# Patient Record
Sex: Female | Born: 1935
Health system: Southern US, Community
[De-identification: ages and names within clinical notes are randomized; demographics above are authoritative.]

## PROBLEM LIST (undated history)

## (undated) ENCOUNTER — Emergency Department (HOSPITAL_COMMUNITY): Payer: Self-pay | Source: Home / Self Care

## (undated) DIAGNOSIS — I10 Essential (primary) hypertension: Secondary | ICD-10-CM

## (undated) DIAGNOSIS — I639 Cerebral infarction, unspecified: Secondary | ICD-10-CM

## (undated) DIAGNOSIS — E785 Hyperlipidemia, unspecified: Secondary | ICD-10-CM

## (undated) DIAGNOSIS — I4891 Unspecified atrial fibrillation: Secondary | ICD-10-CM

## (undated) DIAGNOSIS — E119 Type 2 diabetes mellitus without complications: Secondary | ICD-10-CM

## (undated) DIAGNOSIS — G459 Transient cerebral ischemic attack, unspecified: Secondary | ICD-10-CM

## (undated) HISTORY — DX: Cerebral infarction, unspecified: I63.9

## (undated) HISTORY — PX: ABDOMINAL HYSTERECTOMY: SHX81

## (undated) HISTORY — PX: TUBAL LIGATION: SHX77

---

## 2015-02-15 DIAGNOSIS — R040 Epistaxis: Secondary | ICD-10-CM | POA: Diagnosis not present

## 2015-03-29 DIAGNOSIS — I10 Essential (primary) hypertension: Secondary | ICD-10-CM | POA: Diagnosis not present

## 2015-03-29 DIAGNOSIS — E785 Hyperlipidemia, unspecified: Secondary | ICD-10-CM | POA: Diagnosis not present

## 2015-03-29 DIAGNOSIS — E119 Type 2 diabetes mellitus without complications: Secondary | ICD-10-CM | POA: Diagnosis not present

## 2015-10-28 DIAGNOSIS — E119 Type 2 diabetes mellitus without complications: Secondary | ICD-10-CM | POA: Diagnosis not present

## 2015-10-28 DIAGNOSIS — Z7689 Persons encountering health services in other specified circumstances: Secondary | ICD-10-CM | POA: Diagnosis not present

## 2015-10-28 DIAGNOSIS — E785 Hyperlipidemia, unspecified: Secondary | ICD-10-CM | POA: Diagnosis not present

## 2015-10-28 DIAGNOSIS — I10 Essential (primary) hypertension: Secondary | ICD-10-CM | POA: Diagnosis not present

## 2016-02-10 DIAGNOSIS — D485 Neoplasm of uncertain behavior of skin: Secondary | ICD-10-CM | POA: Diagnosis not present

## 2016-02-10 DIAGNOSIS — L570 Actinic keratosis: Secondary | ICD-10-CM | POA: Diagnosis not present

## 2016-02-11 DIAGNOSIS — D0439 Carcinoma in situ of skin of other parts of face: Secondary | ICD-10-CM | POA: Diagnosis not present

## 2016-02-11 DIAGNOSIS — L570 Actinic keratosis: Secondary | ICD-10-CM | POA: Diagnosis not present

## 2016-02-11 DIAGNOSIS — C44629 Squamous cell carcinoma of skin of left upper limb, including shoulder: Secondary | ICD-10-CM | POA: Diagnosis not present

## 2016-02-21 DIAGNOSIS — C44629 Squamous cell carcinoma of skin of left upper limb, including shoulder: Secondary | ICD-10-CM | POA: Diagnosis not present

## 2016-02-28 DIAGNOSIS — C4492 Squamous cell carcinoma of skin, unspecified: Secondary | ICD-10-CM | POA: Diagnosis not present

## 2016-02-28 DIAGNOSIS — L570 Actinic keratosis: Secondary | ICD-10-CM | POA: Diagnosis not present

## 2016-02-28 DIAGNOSIS — D485 Neoplasm of uncertain behavior of skin: Secondary | ICD-10-CM | POA: Diagnosis not present

## 2016-02-29 DIAGNOSIS — L570 Actinic keratosis: Secondary | ICD-10-CM | POA: Diagnosis not present

## 2016-07-19 ENCOUNTER — Ambulatory Visit (INDEPENDENT_AMBULATORY_CARE_PROVIDER_SITE_OTHER): Payer: Medicare Other

## 2016-07-19 ENCOUNTER — Ambulatory Visit (INDEPENDENT_AMBULATORY_CARE_PROVIDER_SITE_OTHER): Payer: Medicare Other | Admitting: Orthopaedic Surgery

## 2016-07-19 ENCOUNTER — Encounter (INDEPENDENT_AMBULATORY_CARE_PROVIDER_SITE_OTHER): Payer: Self-pay | Admitting: Orthopaedic Surgery

## 2016-07-19 ENCOUNTER — Ambulatory Visit (INDEPENDENT_AMBULATORY_CARE_PROVIDER_SITE_OTHER): Payer: Self-pay

## 2016-07-19 VITALS — BP 161/74 | HR 63 | Ht 63.0 in | Wt 149.0 lb

## 2016-07-19 DIAGNOSIS — M170 Bilateral primary osteoarthritis of knee: Secondary | ICD-10-CM

## 2016-07-19 DIAGNOSIS — G8929 Other chronic pain: Secondary | ICD-10-CM

## 2016-07-19 DIAGNOSIS — M25562 Pain in left knee: Secondary | ICD-10-CM | POA: Diagnosis not present

## 2016-07-19 DIAGNOSIS — M25561 Pain in right knee: Secondary | ICD-10-CM

## 2016-07-19 NOTE — Progress Notes (Signed)
Office Visit Note   Patient: Christy Sutton           Date of Birth: August 26, 1926           MRN: [id number] Visit Date: 07/19/2016              Requested by: No referring provider defined for this encounter. PCP: Pcp Not In System   Assessment & Plan: Visit Diagnoses:  1. Chronic pain of left knee   2. Chronic pain of right knee   3. Primary osteoarthritis of both knees     Plan: Long discussion with patient and her daughter-in-law was present. Advised that the definitive treatment for both knees would be total knee replacements. Patient states that she absolutely does not want to have that done. Recommended conservative treatment today with Visco supplementation but again patient declined. X-rays reviewed with patient today. She'll follow-up prn.  let us no she wants to try the injections in the future..  Follow-Up Instructions: No Follow-up on file.   Orders:  Orders Placed This Encounter  Procedures  . XR Knee 1-2 Views Left  . XR Knee 1-2 Views Right   No orders of the defined types were placed in this encounter.     Procedures: No procedures performed   Clinical Data: No additional findings.   Subjective: Chief Complaint  Patient presents with  . Left Knee - Pain  . Right Knee - Edema, Pain    Christy Sutton is here today for bilateral knee pain, they giveaway on her at times, at times they are worse than others, has stiffness, limps on them at times. This has been going on for years.   Patient states that she's had ongoing bilateral knee pain several years but worse over the last year or so. No injury. She continues to remain very active in her garden, and doing other various yardwork. She denies any previous surgery or treatment for her knees. Has discomfort when she is ambulating, squatting and getting up from a seated position.  Patient has a history of diabetes but is not currently taking metformin that has been previously prescribed area  Review of Systems    Constitutional: Negative.   HENT: Positive for hearing loss.   Respiratory: Negative.   Gastrointestinal: Negative.   Genitourinary: Negative.   Musculoskeletal: Positive for joint swelling.  Neurological: Negative.   Psychiatric/Behavioral: Negative.      Objective: Vital Signs: BP (!) 161/74 (BP Location: Left Arm, Patient Position: Sitting)   Pulse 63   Physical Exam  Constitutional: She is oriented to person, place, and time. No distress.  HENT:  Head: Normocephalic and atraumatic.  Eyes: EOM are normal. Pupils are equal, round, and reactive to light.  Neck: Normal range of motion.  Pulmonary/Chest: No respiratory distress.  Abdominal: She exhibits no distension.  Neurological: She is alert and oriented to person, place, and time.  Skin: Skin is warm.  Psychiatric: She has a normal mood and affect.    Ortho Exam  Specialty Comments:  No specialty comments available.  Imaging: Xr Knee 1-2 Views Left  Result Date: 07/19/2016 X-ray left knee 2 view shows medial compartment bone-on-bone changes and moderate patellofemoral degenerative changes. Periarticular spurs.. No acute finding. Impression end-stage DJD left knee.  Xr Knee 1-2 Views Right  Result Date: 07/19/2016 X-ray right knee AP lateral view shows end-stage DJD with medial compartment bone-on-bone changes and periarticular spurs. Moderate patellofemoral DJD. Impression end-stage DJD right knee.    PMFS History: There are  no active problems to display for this patient.  No past medical history on file.  No family history on file.  No past surgical history on file. Social History   Occupational History  . Not on file.   Social History Main Topics  . Smoking status: Never Smoker  . Smokeless tobacco: Not on file  . Alcohol use Not on file  . Drug use: Unknown  . Sexual activity: Not on file

## 2016-11-16 DIAGNOSIS — I10 Essential (primary) hypertension: Secondary | ICD-10-CM | POA: Diagnosis not present

## 2016-11-16 DIAGNOSIS — E119 Type 2 diabetes mellitus without complications: Secondary | ICD-10-CM | POA: Diagnosis not present

## 2016-11-16 DIAGNOSIS — Z760 Encounter for issue of repeat prescription: Secondary | ICD-10-CM | POA: Diagnosis not present

## 2016-12-26 DIAGNOSIS — R2681 Unsteadiness on feet: Secondary | ICD-10-CM | POA: Diagnosis not present

## 2016-12-26 DIAGNOSIS — E038 Other specified hypothyroidism: Secondary | ICD-10-CM | POA: Diagnosis not present

## 2016-12-26 DIAGNOSIS — Z1389 Encounter for screening for other disorder: Secondary | ICD-10-CM | POA: Diagnosis not present

## 2016-12-26 DIAGNOSIS — E784 Other hyperlipidemia: Secondary | ICD-10-CM | POA: Diagnosis not present

## 2016-12-26 DIAGNOSIS — E1151 Type 2 diabetes mellitus with diabetic peripheral angiopathy without gangrene: Secondary | ICD-10-CM | POA: Diagnosis not present

## 2016-12-26 DIAGNOSIS — H9193 Unspecified hearing loss, bilateral: Secondary | ICD-10-CM | POA: Diagnosis not present

## 2016-12-26 DIAGNOSIS — I10 Essential (primary) hypertension: Secondary | ICD-10-CM | POA: Diagnosis not present

## 2016-12-26 DIAGNOSIS — Z6825 Body mass index (BMI) 25.0-25.9, adult: Secondary | ICD-10-CM | POA: Diagnosis not present

## 2017-01-05 DIAGNOSIS — Z7982 Long term (current) use of aspirin: Secondary | ICD-10-CM | POA: Diagnosis not present

## 2017-01-05 DIAGNOSIS — Z880 Allergy status to penicillin: Secondary | ICD-10-CM | POA: Diagnosis not present

## 2017-01-05 DIAGNOSIS — R200 Anesthesia of skin: Secondary | ICD-10-CM | POA: Diagnosis not present

## 2017-01-05 DIAGNOSIS — R531 Weakness: Secondary | ICD-10-CM | POA: Diagnosis not present

## 2017-01-05 DIAGNOSIS — I10 Essential (primary) hypertension: Secondary | ICD-10-CM | POA: Diagnosis present

## 2017-01-05 DIAGNOSIS — Z7984 Long term (current) use of oral hypoglycemic drugs: Secondary | ICD-10-CM | POA: Diagnosis not present

## 2017-01-05 DIAGNOSIS — Z87891 Personal history of nicotine dependence: Secondary | ICD-10-CM | POA: Diagnosis not present

## 2017-01-05 DIAGNOSIS — I4891 Unspecified atrial fibrillation: Secondary | ICD-10-CM | POA: Diagnosis present

## 2017-01-05 DIAGNOSIS — E785 Hyperlipidemia, unspecified: Secondary | ICD-10-CM | POA: Diagnosis not present

## 2017-01-05 DIAGNOSIS — I517 Cardiomegaly: Secondary | ICD-10-CM | POA: Diagnosis not present

## 2017-01-05 DIAGNOSIS — Z79899 Other long term (current) drug therapy: Secondary | ICD-10-CM | POA: Diagnosis not present

## 2017-01-05 DIAGNOSIS — I481 Persistent atrial fibrillation: Secondary | ICD-10-CM | POA: Diagnosis not present

## 2017-01-05 DIAGNOSIS — I639 Cerebral infarction, unspecified: Secondary | ICD-10-CM | POA: Diagnosis not present

## 2017-01-05 DIAGNOSIS — E118 Type 2 diabetes mellitus with unspecified complications: Secondary | ICD-10-CM | POA: Diagnosis not present

## 2017-01-05 DIAGNOSIS — Z8673 Personal history of transient ischemic attack (TIA), and cerebral infarction without residual deficits: Secondary | ICD-10-CM | POA: Diagnosis not present

## 2017-01-05 DIAGNOSIS — R9082 White matter disease, unspecified: Secondary | ICD-10-CM | POA: Diagnosis not present

## 2017-01-05 DIAGNOSIS — E119 Type 2 diabetes mellitus without complications: Secondary | ICD-10-CM | POA: Diagnosis present

## 2017-01-05 DIAGNOSIS — E02 Subclinical iodine-deficiency hypothyroidism: Secondary | ICD-10-CM | POA: Diagnosis present

## 2017-01-05 DIAGNOSIS — G459 Transient cerebral ischemic attack, unspecified: Secondary | ICD-10-CM | POA: Diagnosis present

## 2017-01-05 DIAGNOSIS — I081 Rheumatic disorders of both mitral and tricuspid valves: Secondary | ICD-10-CM | POA: Diagnosis not present

## 2017-01-09 DIAGNOSIS — F17219 Nicotine dependence, cigarettes, with unspecified nicotine-induced disorders: Secondary | ICD-10-CM | POA: Diagnosis not present

## 2017-01-09 DIAGNOSIS — I4891 Unspecified atrial fibrillation: Secondary | ICD-10-CM | POA: Diagnosis not present

## 2017-01-09 DIAGNOSIS — Z7901 Long term (current) use of anticoagulants: Secondary | ICD-10-CM | POA: Diagnosis not present

## 2017-01-09 DIAGNOSIS — E669 Obesity, unspecified: Secondary | ICD-10-CM | POA: Diagnosis not present

## 2017-01-09 DIAGNOSIS — H9190 Unspecified hearing loss, unspecified ear: Secondary | ICD-10-CM | POA: Diagnosis not present

## 2017-01-09 DIAGNOSIS — I69354 Hemiplegia and hemiparesis following cerebral infarction affecting left non-dominant side: Secondary | ICD-10-CM | POA: Diagnosis not present

## 2017-01-09 DIAGNOSIS — Z7984 Long term (current) use of oral hypoglycemic drugs: Secondary | ICD-10-CM | POA: Diagnosis not present

## 2017-01-09 DIAGNOSIS — I10 Essential (primary) hypertension: Secondary | ICD-10-CM | POA: Diagnosis not present

## 2017-01-09 DIAGNOSIS — E119 Type 2 diabetes mellitus without complications: Secondary | ICD-10-CM | POA: Diagnosis not present

## 2017-01-09 DIAGNOSIS — Z6825 Body mass index (BMI) 25.0-25.9, adult: Secondary | ICD-10-CM | POA: Diagnosis not present

## 2017-01-10 DIAGNOSIS — I4891 Unspecified atrial fibrillation: Secondary | ICD-10-CM | POA: Diagnosis not present

## 2017-01-10 DIAGNOSIS — E119 Type 2 diabetes mellitus without complications: Secondary | ICD-10-CM | POA: Diagnosis not present

## 2017-01-10 DIAGNOSIS — H9190 Unspecified hearing loss, unspecified ear: Secondary | ICD-10-CM | POA: Diagnosis not present

## 2017-01-10 DIAGNOSIS — Z7984 Long term (current) use of oral hypoglycemic drugs: Secondary | ICD-10-CM | POA: Diagnosis not present

## 2017-01-10 DIAGNOSIS — I69354 Hemiplegia and hemiparesis following cerebral infarction affecting left non-dominant side: Secondary | ICD-10-CM | POA: Diagnosis not present

## 2017-01-10 DIAGNOSIS — I10 Essential (primary) hypertension: Secondary | ICD-10-CM | POA: Diagnosis not present

## 2017-01-11 DIAGNOSIS — I4891 Unspecified atrial fibrillation: Secondary | ICD-10-CM | POA: Diagnosis not present

## 2017-01-11 DIAGNOSIS — I10 Essential (primary) hypertension: Secondary | ICD-10-CM | POA: Diagnosis not present

## 2017-01-11 DIAGNOSIS — H9190 Unspecified hearing loss, unspecified ear: Secondary | ICD-10-CM | POA: Diagnosis not present

## 2017-01-11 DIAGNOSIS — Z7984 Long term (current) use of oral hypoglycemic drugs: Secondary | ICD-10-CM | POA: Diagnosis not present

## 2017-01-11 DIAGNOSIS — E119 Type 2 diabetes mellitus without complications: Secondary | ICD-10-CM | POA: Diagnosis not present

## 2017-01-11 DIAGNOSIS — I69354 Hemiplegia and hemiparesis following cerebral infarction affecting left non-dominant side: Secondary | ICD-10-CM | POA: Diagnosis not present

## 2017-01-15 DIAGNOSIS — H9190 Unspecified hearing loss, unspecified ear: Secondary | ICD-10-CM | POA: Diagnosis not present

## 2017-01-15 DIAGNOSIS — I69354 Hemiplegia and hemiparesis following cerebral infarction affecting left non-dominant side: Secondary | ICD-10-CM | POA: Diagnosis not present

## 2017-01-15 DIAGNOSIS — I4891 Unspecified atrial fibrillation: Secondary | ICD-10-CM | POA: Diagnosis not present

## 2017-01-15 DIAGNOSIS — I10 Essential (primary) hypertension: Secondary | ICD-10-CM | POA: Diagnosis not present

## 2017-01-15 DIAGNOSIS — E119 Type 2 diabetes mellitus without complications: Secondary | ICD-10-CM | POA: Diagnosis not present

## 2017-01-15 DIAGNOSIS — Z7984 Long term (current) use of oral hypoglycemic drugs: Secondary | ICD-10-CM | POA: Diagnosis not present

## 2017-01-16 DIAGNOSIS — H9190 Unspecified hearing loss, unspecified ear: Secondary | ICD-10-CM | POA: Diagnosis not present

## 2017-01-16 DIAGNOSIS — I4891 Unspecified atrial fibrillation: Secondary | ICD-10-CM | POA: Diagnosis not present

## 2017-01-16 DIAGNOSIS — Z7984 Long term (current) use of oral hypoglycemic drugs: Secondary | ICD-10-CM | POA: Diagnosis not present

## 2017-01-16 DIAGNOSIS — I69354 Hemiplegia and hemiparesis following cerebral infarction affecting left non-dominant side: Secondary | ICD-10-CM | POA: Diagnosis not present

## 2017-01-16 DIAGNOSIS — E119 Type 2 diabetes mellitus without complications: Secondary | ICD-10-CM | POA: Diagnosis not present

## 2017-01-16 DIAGNOSIS — I10 Essential (primary) hypertension: Secondary | ICD-10-CM | POA: Diagnosis not present

## 2017-01-17 ENCOUNTER — Emergency Department (HOSPITAL_COMMUNITY): Payer: Medicare Other

## 2017-01-17 ENCOUNTER — Inpatient Hospital Stay (HOSPITAL_COMMUNITY)
Admission: EM | Admit: 2017-01-17 | Discharge: 2017-01-19 | DRG: 066 | Disposition: A | Discharge status: Home Health | Payer: Medicare Other | Attending: Nephrology | Admitting: Nephrology

## 2017-01-17 ENCOUNTER — Encounter (HOSPITAL_COMMUNITY): Payer: Self-pay | Admitting: Emergency Medicine

## 2017-01-17 ENCOUNTER — Observation Stay (HOSPITAL_COMMUNITY): Payer: Medicare Other

## 2017-01-17 DIAGNOSIS — Z7982 Long term (current) use of aspirin: Secondary | ICD-10-CM

## 2017-01-17 DIAGNOSIS — Z90710 Acquired absence of both cervix and uterus: Secondary | ICD-10-CM | POA: Diagnosis not present

## 2017-01-17 DIAGNOSIS — Z8249 Family history of ischemic heart disease and other diseases of the circulatory system: Secondary | ICD-10-CM

## 2017-01-17 DIAGNOSIS — R278 Other lack of coordination: Secondary | ICD-10-CM | POA: Diagnosis not present

## 2017-01-17 DIAGNOSIS — E1151 Type 2 diabetes mellitus with diabetic peripheral angiopathy without gangrene: Secondary | ICD-10-CM | POA: Diagnosis not present

## 2017-01-17 DIAGNOSIS — Z794 Long term (current) use of insulin: Secondary | ICD-10-CM

## 2017-01-17 DIAGNOSIS — E785 Hyperlipidemia, unspecified: Secondary | ICD-10-CM | POA: Diagnosis not present

## 2017-01-17 DIAGNOSIS — R531 Weakness: Secondary | ICD-10-CM | POA: Diagnosis not present

## 2017-01-17 DIAGNOSIS — E118 Type 2 diabetes mellitus with unspecified complications: Secondary | ICD-10-CM | POA: Diagnosis not present

## 2017-01-17 DIAGNOSIS — I482 Chronic atrial fibrillation: Secondary | ICD-10-CM | POA: Diagnosis not present

## 2017-01-17 DIAGNOSIS — I480 Paroxysmal atrial fibrillation: Secondary | ICD-10-CM | POA: Diagnosis present

## 2017-01-17 DIAGNOSIS — Z66 Do not resuscitate: Secondary | ICD-10-CM | POA: Diagnosis not present

## 2017-01-17 DIAGNOSIS — Z7984 Long term (current) use of oral hypoglycemic drugs: Secondary | ICD-10-CM | POA: Diagnosis not present

## 2017-01-17 DIAGNOSIS — Z880 Allergy status to penicillin: Secondary | ICD-10-CM | POA: Diagnosis not present

## 2017-01-17 DIAGNOSIS — Z8673 Personal history of transient ischemic attack (TIA), and cerebral infarction without residual deficits: Secondary | ICD-10-CM

## 2017-01-17 DIAGNOSIS — I6789 Other cerebrovascular disease: Secondary | ICD-10-CM | POA: Diagnosis not present

## 2017-01-17 DIAGNOSIS — I63411 Cerebral infarction due to embolism of right middle cerebral artery: Secondary | ICD-10-CM

## 2017-01-17 DIAGNOSIS — I639 Cerebral infarction, unspecified: Secondary | ICD-10-CM | POA: Diagnosis not present

## 2017-01-17 DIAGNOSIS — H9193 Unspecified hearing loss, bilateral: Secondary | ICD-10-CM | POA: Diagnosis present

## 2017-01-17 DIAGNOSIS — G459 Transient cerebral ischemic attack, unspecified: Secondary | ICD-10-CM | POA: Diagnosis not present

## 2017-01-17 DIAGNOSIS — R29818 Other symptoms and signs involving the nervous system: Secondary | ICD-10-CM | POA: Diagnosis not present

## 2017-01-17 DIAGNOSIS — I10 Essential (primary) hypertension: Secondary | ICD-10-CM | POA: Diagnosis present

## 2017-01-17 HISTORY — DX: Unspecified atrial fibrillation: I48.91

## 2017-01-17 HISTORY — DX: Type 2 diabetes mellitus without complications: E11.9

## 2017-01-17 HISTORY — DX: Essential (primary) hypertension: I10

## 2017-01-17 HISTORY — DX: Hyperlipidemia, unspecified: E78.5

## 2017-01-17 HISTORY — DX: Transient cerebral ischemic attack, unspecified: G45.9

## 2017-01-17 LAB — COMPREHENSIVE METABOLIC PANEL
ALBUMIN: 3.9 g/dL (ref 3.5–5.0)
ALK PHOS: 38 U/L (ref 38–126)
ALT: 13 U/L — AB (ref 14–54)
AST: 25 U/L (ref 15–41)
Anion gap: 10 (ref 5–15)
BUN: 9 mg/dL (ref 6–20)
CHLORIDE: 107 mmol/L (ref 101–111)
CO2: 22 mmol/L (ref 22–32)
CREATININE: 0.78 mg/dL (ref 0.44–1.00)
Calcium: 9.1 mg/dL (ref 8.9–10.3)
GFR calc Af Amer: >60 mL/min (ref >60)
GFR calc non Af Amer: >60 mL/min (ref >60)
GLUCOSE: 126 mg/dL — AB (ref 65–99)
Potassium: 3.8 mmol/L (ref 3.5–5.1)
SODIUM: 139 mmol/L (ref 135–145)
Total Bilirubin: 1 mg/dL (ref 0.3–1.2)
Total Protein: 6.2 g/dL — ABNORMAL LOW (ref 6.5–8.1)

## 2017-01-17 LAB — I-STAT CHEM 8, ED
BUN: 10 mg/dL (ref 6–20)
CALCIUM ION: 1.11 mmol/L — AB (ref 1.15–1.40)
Chloride: 105 mmol/L (ref 101–111)
Creatinine, Ser: 0.7 mg/dL (ref 0.44–1.00)
GLUCOSE: 124 mg/dL — AB (ref 65–99)
HEMATOCRIT: 36 % (ref 36.0–46.0)
HEMOGLOBIN: 12.2 g/dL (ref 12.0–15.0)
Potassium: 3.9 mmol/L (ref 3.5–5.1)
SODIUM: 141 mmol/L (ref 135–145)
TCO2: 26 mmol/L (ref 0–100)

## 2017-01-17 LAB — DIFFERENTIAL
BASOS ABS: 0 10*3/uL (ref 0.0–0.1)
BASOS PCT: 0 %
Eosinophils Absolute: 0.1 10*3/uL (ref 0.0–0.7)
Eosinophils Relative: 2 %
LYMPHS PCT: 17 %
Lymphs Abs: 0.9 10*3/uL (ref 0.7–4.0)
Monocytes Absolute: 0.4 10*3/uL (ref 0.1–1.0)
Monocytes Relative: 8 %
NEUTROS ABS: 4 10*3/uL (ref 1.7–7.7)
NEUTROS PCT: 73 %

## 2017-01-17 LAB — CBC
HCT: 36.7 % (ref 36.0–46.0)
Hemoglobin: 12.5 g/dL (ref 12.0–15.0)
MCH: 30.6 pg (ref 26.0–34.0)
MCHC: 34.1 g/dL (ref 30.0–36.0)
MCV: 89.7 fL (ref 78.0–100.0)
PLATELETS: 126 10*3/uL — AB (ref 150–400)
RBC: 4.09 MIL/uL (ref 3.87–5.11)
RDW: 12.7 % (ref 11.5–15.5)
WBC: 5.4 10*3/uL (ref 4.0–10.5)

## 2017-01-17 LAB — I-STAT TROPONIN, ED: Troponin i, poc: 0.01 ng/mL (ref 0.00–0.08)

## 2017-01-17 LAB — GLUCOSE, CAPILLARY
Glucose-Capillary: 162 mg/dL — ABNORMAL HIGH (ref 65–99)
Glucose-Capillary: 95 mg/dL (ref 65–99)

## 2017-01-17 LAB — CBG MONITORING, ED: GLUCOSE-CAPILLARY: 98 mg/dL (ref 65–99)

## 2017-01-17 LAB — PROTIME-INR
INR: 3.25
PROTHROMBIN TIME: 33.9 s — AB (ref 11.4–15.2)

## 2017-01-17 LAB — APTT: APTT: 63 s — AB (ref 24–36)

## 2017-01-17 MED ORDER — INSULIN ASPART 100 UNIT/ML ~~LOC~~ SOLN: 0.0000 [IU] | Freq: Every day | SUBCUTANEOUS | Status: DC

## 2017-01-17 MED ORDER — MIDAZOLAM HCL 2 MG/2ML IJ SOLN: 1.0000 mg | Freq: Once | INTRAMUSCULAR | Status: DC

## 2017-01-17 MED ORDER — APIXABAN 5 MG PO TABS
5.0000 mg | ORAL_TABLET | Freq: Two times a day (BID) | ORAL | Status: DC
  Administered 2017-01-18 – 2017-01-19 (×3): 5 mg via ORAL
  Filled 2017-01-17 (×3): qty 1

## 2017-01-17 MED ORDER — ASPIRIN 81 MG PO CHEW: 81.0000 mg | CHEWABLE_TABLET | Freq: Every day | ORAL | Status: DC

## 2017-01-17 MED ORDER — APIXABAN 5 MG PO TABS
5.0000 mg | ORAL_TABLET | Freq: Two times a day (BID) | ORAL | Status: DC
Start: 2017-01-17 — End: 2017-01-17

## 2017-01-17 MED ORDER — IOPAMIDOL (ISOVUE-370) INJECTION 76%
INTRAVENOUS | Status: AC
  Administered 2017-01-17: 50 mL
  Filled 2017-01-17: qty 50

## 2017-01-17 MED ORDER — INSULIN ASPART 100 UNIT/ML ~~LOC~~ SOLN
0.0000 [IU] | Freq: Three times a day (TID) | SUBCUTANEOUS | Status: DC
  Administered 2017-01-17: 2 [IU] via SUBCUTANEOUS
  Administered 2017-01-18: 1 [IU] via SUBCUTANEOUS

## 2017-01-17 MED ORDER — HYDRALAZINE HCL 20 MG/ML IJ SOLN: 5.0000 mg | INTRAMUSCULAR | Status: DC | PRN

## 2017-01-17 MED ORDER — ATORVASTATIN CALCIUM 10 MG PO TABS
20.0000 mg | ORAL_TABLET | Freq: Every day | ORAL | Status: DC
  Administered 2017-01-18: 20 mg via ORAL
  Filled 2017-01-17 (×2): qty 2

## 2017-01-17 NOTE — Consult Note (Signed)
Requesting Physician: Dr. Jeneen Rinks in ED    Chief Complaint: transient L weakness   History obtained from:   Patient and Chart   And daughter   HPI:                                                                                                                                         Christy Sutton is an 83 y.o. female with PMH significant for afib on Eliquis ( last dose taken this AM confirmed by daughter), who was last seen well at 10 pm last night when she went to bed then woke up this morning at 4 am with L sided weakness and gait disturbance, pt went back to bed then was seen by her daughter at 730 am who noticed the left sided weakness and decided to call 911 and bring pt to ED, in ED pt is back to baseline, CT/CTA showed no acute changes ( there was small delay in getting [place] since the firt CT scanner was not working and we had to move pt to the 2nd scanner), pt was not given IVTPA since she was outside the window and took eliquis this morning , no intervention due to no LVO on CTA.  Date last known well: 01/16/2017 Time last known well: 10 pm  tPA Given: No   Modified Rankin: 0    Past Medical History:  Diagnosis Date  . TIA (transient ischemic attack)     No past surgical history on file.  No family history on file. Social History:  reports that she has never smoked. She does not have any smokeless tobacco history on file. Her alcohol and drug histories are not on file.  Allergies:  Allergies  Allergen Reactions  . Penicillins Hives    Medications:                                                                                                                           {medication reviewed/ by me  ROS:  History obtained from the patient  General ROS: negative for - chills, fatigue, fever, night sweats, weight gain or weight loss Psychological  ROS: negative for - behavioral disorder, hallucinations, memory difficulties, mood swings or suicidal ideation Ophthalmic ROS: negative for - blurry vision, double vision, eye pain or loss of vision ENT ROS: negative for - epistaxis, nasal discharge, oral lesions, sore throat, tinnitus or vertigo Allergy and Immunology ROS: negative for - hives or itchy/watery eyes Hematological and Lymphatic ROS: negative for - bleeding problems, bruising or swollen lymph nodes Endocrine ROS: negative for - galactorrhea, hair pattern changes, polydipsia/polyuria or temperature intolerance Respiratory ROS: negative for - cough, hemoptysis, shortness of breath or wheezing Cardiovascular ROS: negative for - chest pain, dyspnea on exertion, edema or irregular heartbeat Gastrointestinal ROS: negative for - abdominal pain, diarrhea, hematemesis, nausea/vomiting or stool incontinence Genito-Urinary ROS: negative for - dysuria, hematuria, incontinence or urinary frequency/urgency Musculoskeletal ROS: negative for - joint swelling or muscular weakness Neurological ROS: as noted in HPI Dermatological ROS: negative for rash and skin lesion changes  Neurologic Examination:    NIHSS 1 due to minor L ataxia                                                                                                  Blood pressure 119/89, pulse 62, temperature 98.1 F (36.7 C), temperature source Oral, resp. rate 17, SpO2 98 %.   Cardiovascular- irregular, S1S2  pulses palpable throughout   Lungs- CTA \\Abdomen  soft NT  Neurological Examination Mental Status: Alert, oriented, thought content appropriate.  Speech fluent without evidence of aphasia.  Able to follow 3 step commands without difficulty. Cranial Nerves: II: Discs flat bilaterally; Visual fields grossly normal,  III,IV, VI: ptosis not present, extra-ocular motions intact bilaterally, pupils equal, round, reactive to light and accommodation V,VII: smile symmetric, facial  light touch sensation normal bilaterally VIII: decrease hearing bilaterally ( chronic) IX,X: uvula rises symmetrically XI: bilateral shoulder shrug XII: midline tongue extension Motor: Right : Upper extremity   5/5    Left:     Upper extremity   5/5  Lower extremity   5/5     Lower extremity   5/5 Tone and bulk:normal tone throughout; no atrophy noted Sensory: Pinprick and light touch intact throughout, bilaterally Cerebellar: abnormal finger-to-nose on L, normal heel-to-shin test Gait: unable to test      Lab Results: Basic Metabolic Panel:  Recent Labs Lab 01/17/17 1058 01/17/17 1105  NA 139 141  K 3.8 3.9  CL 107 105  CO2 22  --   GLUCOSE 126* 124*  BUN 9 10  CREATININE 0.78 0.70  CALCIUM 9.1  --     Liver Function Tests:  Recent Labs Lab 01/17/17 1058  AST 25  ALT 13*  ALKPHOS 38  BILITOT 1.0  PROT 6.2*  ALBUMIN 3.9   No results for input(s): LIPASE, AMYLASE in the last 168 hours. No results for input(s): AMMONIA in the last 168 hours.  CBC:  Recent Labs Lab 01/17/17 1058 01/17/17 1105  WBC 5.4  --   NEUTROABS 4.0  --  HGB 12.5 12.2  HCT 36.7 36.0  MCV 89.7  --   PLT 126*  --     Cardiac Enzymes: No results for input(s): CKTOTAL, CKMB, CKMBINDEX, TROPONINI in the last 168 hours.  Lipid Panel: No results for input(s): CHOL, TRIG, HDL, CHOLHDL, VLDL, LDLCALC in the last 168 hours.  CBG:  Recent Labs Lab 01/17/17 1159  GLUCAP 98    Microbiology: No results found for this or any previous visit.  Coagulation Studies:  Recent Labs  01/17/17 1058  LABPROT 33.9*  INR 3.25    Imaging: Ct Angio Head W Or Wo Contrast  Result Date: 01/17/2017 CLINICAL DATA:  Acute onset of left-sided weakness beginning last night. EXAM: CT ANGIOGRAPHY HEAD AND NECK TECHNIQUE: Multidetector CT imaging of the head and neck was performed using the standard protocol during bolus administration of intravenous contrast. Multiplanar CT image reconstructions  and MIPs were obtained to evaluate the vascular anatomy. Carotid stenosis measurements (when applicable) are obtained utilizing NASCET criteria, using the distal internal carotid diameter as the denominator. CONTRAST:  50 mL Isovue 370 COMPARISON:  CT head without contrast from the same day. CTA head and neck 01/05/2017. FINDINGS: CTA NECK FINDINGS Aortic arch: A 3 vessel arch configuration is present. Atherosclerotic calcifications involving the origin of left common carotid artery and the left subclavian artery are stable. There is no significant stenosis. Right carotid system: The right common carotid artery is within normal limits. Slight atherosclerotic irregularity is present at the right carotid bifurcation without a significant stenosis. The cervical right internal carotid artery is mildly tortuous without a focal stenosis. Left carotid system: The left common carotid artery is within normal limits. Atherosclerotic changes are present at the left carotid bifurcation without a significant stenosis. The cervical left internal carotid artery is normal. Vertebral arteries: The vertebral arteries originate from the subclavian arteries bilaterally. The right vertebral artery is slightly dominant to the left. There is no focal stenosis or vascular injury to either vertebral artery in the neck. Skeleton: Mild spondylosis of the cervical spine is again seen. Other neck: The soft tissues the neck are unremarkable. Upper chest: Pleural-parenchymal scarring is again noted the lung apices. The lungs are otherwise clear. Previously noted edema has improved. Peripheral reticular changes may reflect early interstitial disease. Review of the MIP images confirms the above findings CTA HEAD FINDINGS Anterior circulation: Atherosclerotic calcifications are again seen in the cavernous internal carotid arteries bilaterally without a significant stenosis and sub. There is no aneurysm. The ICA termini are within normal limits  bilaterally. The A1 and M1 segments are normal. The anterior communicating artery is patent. The MCA bifurcations are within normal limits. ACA and MCA branch vessels are unremarkable. Posterior circulation: The right vertebral artery is the dominant vessel. PICA origins are visualized and normal. The basilar artery is within normal limits. Both posterior cerebral arteries originate from the basilar tip. The PCA branch vessels are within normal limits. Venous sinuses: The dural sinuses are patent. The transverse sinuses are codominant. The straight sinus and deep cerebral veins are intact. Cortical veins are unremarkable. Anatomic variants: None Delayed phase: The postcontrast images demonstrate no pathologic enhancement. Basal ganglia calcifications are stable. Review of the MIP images confirms the above findings IMPRESSION: 1. No evidence for emerging large vessel occlusion. 2. Atherosclerotic changes at the aortic arch, bilateral carotid bifurcations, and cavernous internal carotid arteries without significant focal stenosis. 3. Normal variant circle of Willis without significant proximal stenosis, aneurysm, or branch vessel occlusion. 4. Mild multilevel  spondylosis of the cervical spine. 5. Question early interstitial lung changes. These results were called by telephone at the time of interpretation on 01/17/2017 at 11:32 am to Dr. Arnaldo Natal , who verbally acknowledged these results. Electronically Signed   By: San Morelle M.D.   On: 01/17/2017 11:55   Ct Angio Neck W And/or Wo Contrast  Result Date: 01/17/2017 CLINICAL DATA:  Acute onset of left-sided weakness beginning last night. EXAM: CT ANGIOGRAPHY HEAD AND NECK TECHNIQUE: Multidetector CT imaging of the head and neck was performed using the standard protocol during bolus administration of intravenous contrast. Multiplanar CT image reconstructions and MIPs were obtained to evaluate the vascular anatomy. Carotid stenosis measurements (when  applicable) are obtained utilizing NASCET criteria, using the distal internal carotid diameter as the denominator. CONTRAST:  50 mL Isovue 370 COMPARISON:  CT head without contrast from the same day. CTA head and neck 01/05/2017. FINDINGS: CTA NECK FINDINGS Aortic arch: A 3 vessel arch configuration is present. Atherosclerotic calcifications involving the origin of left common carotid artery and the left subclavian artery are stable. There is no significant stenosis. Right carotid system: The right common carotid artery is within normal limits. Slight atherosclerotic irregularity is present at the right carotid bifurcation without a significant stenosis. The cervical right internal carotid artery is mildly tortuous without a focal stenosis. Left carotid system: The left common carotid artery is within normal limits. Atherosclerotic changes are present at the left carotid bifurcation without a significant stenosis. The cervical left internal carotid artery is normal. Vertebral arteries: The vertebral arteries originate from the subclavian arteries bilaterally. The right vertebral artery is slightly dominant to the left. There is no focal stenosis or vascular injury to either vertebral artery in the neck. Skeleton: Mild spondylosis of the cervical spine is again seen. Other neck: The soft tissues the neck are unremarkable. Upper chest: Pleural-parenchymal scarring is again noted the lung apices. The lungs are otherwise clear. Previously noted edema has improved. Peripheral reticular changes may reflect early interstitial disease. Review of the MIP images confirms the above findings CTA HEAD FINDINGS Anterior circulation: Atherosclerotic calcifications are again seen in the cavernous internal carotid arteries bilaterally without a significant stenosis and sub. There is no aneurysm. The ICA termini are within normal limits bilaterally. The A1 and M1 segments are normal. The anterior communicating artery is patent. The  MCA bifurcations are within normal limits. ACA and MCA branch vessels are unremarkable. Posterior circulation: The right vertebral artery is the dominant vessel. PICA origins are visualized and normal. The basilar artery is within normal limits. Both posterior cerebral arteries originate from the basilar tip. The PCA branch vessels are within normal limits. Venous sinuses: The dural sinuses are patent. The transverse sinuses are codominant. The straight sinus and deep cerebral veins are intact. Cortical veins are unremarkable. Anatomic variants: None Delayed phase: The postcontrast images demonstrate no pathologic enhancement. Basal ganglia calcifications are stable. Review of the MIP images confirms the above findings IMPRESSION: 1. No evidence for emerging large vessel occlusion. 2. Atherosclerotic changes at the aortic arch, bilateral carotid bifurcations, and cavernous internal carotid arteries without significant focal stenosis. 3. Normal variant circle of Willis without significant proximal stenosis, aneurysm, or branch vessel occlusion. 4. Mild multilevel spondylosis of the cervical spine. 5. Question early interstitial lung changes. These results were called by telephone at the time of interpretation on 01/17/2017 at 11:32 am to Dr. Arnaldo Natal , who verbally acknowledged these results. Electronically Signed   By: San Morelle  M.D.   On: 01/17/2017 11:55   Ct Head Code Stroke W/o Cm  Result Date: 01/17/2017 CLINICAL DATA:  Code stroke. Acute onset of left-sided deficits. Code stroke. Left-sided weakness beginning last night. EXAM: CT HEAD WITHOUT CONTRAST TECHNIQUE: Contiguous axial images were obtained from the base of the skull through the vertex without intravenous contrast. COMPARISON:  CT of the head 01/05/2017.  MRI brain 01/05/2017. FINDINGS: Brain: Bilateral basal ganglia calcifications are again noted, right greater than left. The basal ganglia are otherwise intact. No acute infarct,  hemorrhage, or mass lesion is present. Mild white matter changes are stable. The ventricles are of normal size. No significant extra-axial fluid collection is present. The brainstem and cerebellum are within normal limits. Vascular: Atherosclerotic calcifications are present in the cavernous internal carotid arteries bilaterally without a hyperdense vessel. Skull: The calvarium is intact. No focal lytic or blastic lesions are present. Sinuses/Orbits: The paranasal sinuses and mastoid air cells are clear. Bilateral lens replacements are present. The globes and orbits are within normal limits. ASPECTS [facility] Stroke Program Early CT Score) - Ganglionic level infarction (caudate, lentiform nuclei, internal capsule, insula, M1-M3 cortex): 7/7 - Supraganglionic infarction (M4-M6 cortex): 3/3 Total score (0-10 with 10 being normal): 10/10 IMPRESSION: 1. No acute intracranial abnormality. 2. Stable atrophy and mild white matter disease, likely within normal limits for age. 3. Atherosclerosis without a hyperdense vessel. 4. ASPECTS is 10/10 These results were called by telephone at the time of interpretation on 01/17/2017 at 11:28 am to Dr. Paschal Dopp , who verbally acknowledged these results. Electronically Signed   By: San Morelle M.D.   On: 01/17/2017 11:28           Assessment: 83 y.o. female with PMH of afib on Eliquis  and B/L hearing loss who presented with transient L weakness with a negative CT/CTA -NO IVTPA due to : outside the window, low NIHss, the use of Eliquis ( lasrt dose this AM) -No intervention due to no LVO on CTA -Continue Eliquis -MRI brain -Stroke work up including ECHO, A1C and lipid -start Lipitor  -PT/OT  -Plan discussed with daughter at bedside

## 2017-01-17 NOTE — ED Notes (Signed)
Pt CBG was 98, notified Brooke(RN)

## 2017-01-17 NOTE — ED Notes (Signed)
PA-C at bedside 

## 2017-01-17 NOTE — ED Notes (Signed)
Dr. Merrell at bedside. 

## 2017-01-17 NOTE — H&P (Signed)
History and Physical    Kiyonna Tortorelli MEQ:[id number] DOB: 10/04/1925 DOA: 01/17/2017  PCP: System, Pcp Not In Patient coming from: Home  Chief Complaint: L sided weakness  HPI: Hillary Struss is a 83 y.o. female with medical history significant of intervertebral relation, diabetes, hyperlipidemia, hypertension, TIAs. Patient presenting with recurrent episode of left-sided numbness and weakness. Patient reports long-standing history of intermittent left-sided weakness. Symptoms always resolve without intervention. Current episode however is constant and worse than ever before. Patient endorses going to bed on the day prior to admission in her normal state of health. Patient awoke somewhere between 3:57 in the morning and noted to have left-sided weakness. Patient states she was able to "shake it out. "She was able to go back to bed a short time later feeling completely normal. However one patient awoke between 06:00 and 07:00 she reported feeling completely normal. The short time later while sitting in a chair in her living room patient endorsed rapid onset left-sided numbness and weakness of her arm and leg. Family was called to evaluate patient for short time later and noted that she also had significant left facial droop and weakness. Patient denies any recent fevers, chest pain, shortness of breath, palpitations, diaphoresis, nausea, vomiting, neck stiffness, headache, LOC, fall, abdominal pain, dysuria, frequency.      ED Course: Objective findings outlined below.  Review of Systems: As per HPI otherwise all other systems reviewed and are negative  Ambulatory Status: No restrictions  Past Medical History:  Diagnosis Date  . Atrial fibrillation ([place])   . Diabetes mellitus without complication ([place])   . Hyperlipidemia   . Hypertension   . TIA (transient ischemic attack)     Past Surgical History:  Procedure Laterality Date  . ABDOMINAL HYSTERECTOMY    . TUBAL LIGATION      Social  History   Social History  . Marital status: Single    Spouse name: N/A  . Number of children: N/A  . Years of education: N/A   Occupational History  . Not on file.   Social History Main Topics  . Smoking status: Never Smoker  . Smokeless tobacco: Never Used  . Alcohol use No  . Drug use: No  . Sexual activity: Not on file   Other Topics Concern  . Not on file   Social History Narrative  . No narrative on file    Allergies  Allergen Reactions  . Penicillins Hives    Family History  Problem Relation Age of Onset  . Heart attack Father     Prior to Admission medications   Medication Sig Start Date End Date Taking? Authorizing Provider  aspirin 81 MG chewable tablet Chew by mouth.    [provider]  atenolol (TENORMIN) 25 MG tablet  06/26/16   [provider]  lisinopril (PRINIVIL,ZESTRIL) 5 MG tablet  07/14/16   [provider]  metFORMIN (GLUCOPHAGE-XR) 500 MG 24 hr tablet TAKE ONE TABLET BY MOUTH ONCE DAILY WITH BREAKFAST 01/25/16   [provider]  simvastatin (ZOCOR) 40 MG tablet  05/15/16   [provider]    Physical Exam: Vitals:   01/17/17 1300 01/17/17 1315 01/17/17 1322 01/17/17 1330  BP:      Pulse: (!) 59 66    Resp: 19 12  17   Temp:   98.1 F (36.7 C)   TempSrc:      SpO2: 99% 99%  99%     General:  Appears calm and comfortable Eyes:  PERRL,  EOMI, normal lids, iris ENT:  grossly normal  lips & tongue, mmm. Hard of hearing Neck:  no LAD, masses or thyromegaly Cardiovascular:  RRR, no m/r/g. No LE edema.  Respiratory:  CTA bilaterally, no w/r/r. Normal respiratory effort. Abdomen:  soft, ntnd, NABS Skin:  no rash or induration seen on limited exam Musculoskeletal:  , good ROM, no bony abnormality Psychiatric:  grossly normal mood and affect, speech fluent and appropriate, AOx3 Neurologic: Cranial nerves II through XII grossly intact, hip flexion 4-5 bilateral, grip strength 5 out of 5 bilateral,  dysmetria noted on the left with finger to nose. Labs on Admission: I have personally reviewed following labs and imaging studies  CBC:  Recent Labs Lab 01/17/17 1058 01/17/17 1105  WBC 5.4  --   NEUTROABS 4.0  --   HGB 12.5 12.2  HCT 36.7 36.0  MCV 89.7  --   PLT 126*  --    Basic Metabolic Panel:  Recent Labs Lab 01/17/17 1058 01/17/17 1105  NA 139 141  K 3.8 3.9  CL 107 105  CO2 22  --   GLUCOSE 126* 124*  BUN 9 10  CREATININE 0.78 0.70  CALCIUM 9.1  --    GFR: CrCl cannot be calculated (Unknown ideal weight.). Liver Function Tests:  Recent Labs Lab 01/17/17 1058  AST 25  ALT 13*  ALKPHOS 38  BILITOT 1.0  PROT 6.2*  ALBUMIN 3.9   No results for input(s): LIPASE, AMYLASE in the last 168 hours. No results for input(s): AMMONIA in the last 168 hours. Coagulation Profile:  Recent Labs Lab 01/17/17 1058  INR 3.25   Cardiac Enzymes: No results for input(s): CKTOTAL, CKMB, CKMBINDEX, TROPONINI in the last 168 hours. BNP (last 3 results) No results for input(s): PROBNP in the last 8760 hours. HbA1C: No results for input(s): HGBA1C in the last 72 hours. CBG:  Recent Labs Lab 01/17/17 1159  GLUCAP 98   Lipid Profile: No results for input(s): CHOL, HDL, LDLCALC, TRIG, CHOLHDL, LDLDIRECT in the last 72 hours. Thyroid Function Tests: No results for input(s): TSH, T4TOTAL, FREET4, T3FREE, THYROIDAB in the last 72 hours. Anemia Panel: No results for input(s): VITAMINB12, FOLATE, FERRITIN, TIBC, IRON, RETICCTPCT in the last 72 hours. Urine analysis: No results found for: COLORURINE, APPEARANCEUR, LABSPEC, PHURINE, GLUCOSEU, HGBUR, BILIRUBINUR, KETONESUR, PROTEINUR, UROBILINOGEN, NITRITE, LEUKOCYTESUR  Creatinine Clearance: CrCl cannot be calculated (Unknown ideal weight.).  Sepsis Labs: @LABRCNTIP (procalcitonin:4,lacticidven:4) )No results found for this or any previous visit (from the past 240 hour(s)).   Radiological Exams on Admission: Ct  Angio Head W Or Wo Contrast  Result Date: 01/17/2017 CLINICAL DATA:  Acute onset of left-sided weakness beginning last night. EXAM: CT ANGIOGRAPHY HEAD AND NECK TECHNIQUE: Multidetector CT imaging of the head and neck was performed using the standard protocol during bolus administration of intravenous contrast. Multiplanar CT image reconstructions and MIPs were obtained to evaluate the vascular anatomy. Carotid stenosis measurements (when applicable) are obtained utilizing NASCET criteria, using the distal internal carotid diameter as the denominator. CONTRAST:  50 mL Isovue 370 COMPARISON:  CT head without contrast from the same day. CTA head and neck 01/05/2017. FINDINGS: CTA NECK FINDINGS Aortic arch: A 3 vessel arch configuration is present. Atherosclerotic calcifications involving the origin of left common carotid artery and the left subclavian artery are stable. There is no significant stenosis. Right carotid system: The right common carotid artery is within normal limits. Slight atherosclerotic irregularity is present at the right carotid bifurcation without a significant stenosis.  The cervical right internal carotid artery is mildly tortuous without a focal stenosis. Left carotid system: The left common carotid artery is within normal limits. Atherosclerotic changes are present at the left carotid bifurcation without a significant stenosis. The cervical left internal carotid artery is normal. Vertebral arteries: The vertebral arteries originate from the subclavian arteries bilaterally. The right vertebral artery is slightly dominant to the left. There is no focal stenosis or vascular injury to either vertebral artery in the neck. Skeleton: Mild spondylosis of the cervical spine is again seen. Other neck: The soft tissues the neck are unremarkable. Upper chest: Pleural-parenchymal scarring is again noted the lung apices. The lungs are otherwise clear. Previously noted edema has improved. Peripheral reticular  changes may reflect early interstitial disease. Review of the MIP images confirms the above findings CTA HEAD FINDINGS Anterior circulation: Atherosclerotic calcifications are again seen in the cavernous internal carotid arteries bilaterally without a significant stenosis and sub. There is no aneurysm. The ICA termini are within normal limits bilaterally. The A1 and M1 segments are normal. The anterior communicating artery is patent. The MCA bifurcations are within normal limits. ACA and MCA branch vessels are unremarkable. Posterior circulation: The right vertebral artery is the dominant vessel. PICA origins are visualized and normal. The basilar artery is within normal limits. Both posterior cerebral arteries originate from the basilar tip. The PCA branch vessels are within normal limits. Venous sinuses: The dural sinuses are patent. The transverse sinuses are codominant. The straight sinus and deep cerebral veins are intact. Cortical veins are unremarkable. Anatomic variants: None Delayed phase: The postcontrast images demonstrate no pathologic enhancement. Basal ganglia calcifications are stable. Review of the MIP images confirms the above findings IMPRESSION: 1. No evidence for emerging large vessel occlusion. 2. Atherosclerotic changes at the aortic arch, bilateral carotid bifurcations, and cavernous internal carotid arteries without significant focal stenosis. 3. Normal variant circle of Willis without significant proximal stenosis, aneurysm, or branch vessel occlusion. 4. Mild multilevel spondylosis of the cervical spine. 5. Question early interstitial lung changes. These results were called by telephone at the time of interpretation on 01/17/2017 at 11:32 am to Dr. Arnaldo Natal , who verbally acknowledged these results. Electronically Signed   By: San Morelle M.D.   On: 01/17/2017 11:55   Ct Angio Neck W And/or Wo Contrast  Result Date: 01/17/2017 CLINICAL DATA:  Acute onset of left-sided  weakness beginning last night. EXAM: CT ANGIOGRAPHY HEAD AND NECK TECHNIQUE: Multidetector CT imaging of the head and neck was performed using the standard protocol during bolus administration of intravenous contrast. Multiplanar CT image reconstructions and MIPs were obtained to evaluate the vascular anatomy. Carotid stenosis measurements (when applicable) are obtained utilizing NASCET criteria, using the distal internal carotid diameter as the denominator. CONTRAST:  50 mL Isovue 370 COMPARISON:  CT head without contrast from the same day. CTA head and neck 01/05/2017. FINDINGS: CTA NECK FINDINGS Aortic arch: A 3 vessel arch configuration is present. Atherosclerotic calcifications involving the origin of left common carotid artery and the left subclavian artery are stable. There is no significant stenosis. Right carotid system: The right common carotid artery is within normal limits. Slight atherosclerotic irregularity is present at the right carotid bifurcation without a significant stenosis. The cervical right internal carotid artery is mildly tortuous without a focal stenosis. Left carotid system: The left common carotid artery is within normal limits. Atherosclerotic changes are present at the left carotid bifurcation without a significant stenosis. The cervical left internal carotid artery is  normal. Vertebral arteries: The vertebral arteries originate from the subclavian arteries bilaterally. The right vertebral artery is slightly dominant to the left. There is no focal stenosis or vascular injury to either vertebral artery in the neck. Skeleton: Mild spondylosis of the cervical spine is again seen. Other neck: The soft tissues the neck are unremarkable. Upper chest: Pleural-parenchymal scarring is again noted the lung apices. The lungs are otherwise clear. Previously noted edema has improved. Peripheral reticular changes may reflect early interstitial disease. Review of the MIP images confirms the above  findings CTA HEAD FINDINGS Anterior circulation: Atherosclerotic calcifications are again seen in the cavernous internal carotid arteries bilaterally without a significant stenosis and sub. There is no aneurysm. The ICA termini are within normal limits bilaterally. The A1 and M1 segments are normal. The anterior communicating artery is patent. The MCA bifurcations are within normal limits. ACA and MCA branch vessels are unremarkable. Posterior circulation: The right vertebral artery is the dominant vessel. PICA origins are visualized and normal. The basilar artery is within normal limits. Both posterior cerebral arteries originate from the basilar tip. The PCA branch vessels are within normal limits. Venous sinuses: The dural sinuses are patent. The transverse sinuses are codominant. The straight sinus and deep cerebral veins are intact. Cortical veins are unremarkable. Anatomic variants: None Delayed phase: The postcontrast images demonstrate no pathologic enhancement. Basal ganglia calcifications are stable. Review of the MIP images confirms the above findings IMPRESSION: 1. No evidence for emerging large vessel occlusion. 2. Atherosclerotic changes at the aortic arch, bilateral carotid bifurcations, and cavernous internal carotid arteries without significant focal stenosis. 3. Normal variant circle of Willis without significant proximal stenosis, aneurysm, or branch vessel occlusion. 4. Mild multilevel spondylosis of the cervical spine. 5. Question early interstitial lung changes. These results were called by telephone at the time of interpretation on 01/17/2017 at 11:32 am to Dr. Arnaldo Natal , who verbally acknowledged these results. Electronically Signed   By: San Morelle M.D.   On: 01/17/2017 11:55   Ct Head Code Stroke W/o Cm  Result Date: 01/17/2017 CLINICAL DATA:  Code stroke. Acute onset of left-sided deficits. Code stroke. Left-sided weakness beginning last night. EXAM: CT HEAD WITHOUT  CONTRAST TECHNIQUE: Contiguous axial images were obtained from the base of the skull through the vertex without intravenous contrast. COMPARISON:  CT of the head 01/05/2017.  MRI brain 01/05/2017. FINDINGS: Brain: Bilateral basal ganglia calcifications are again noted, right greater than left. The basal ganglia are otherwise intact. No acute infarct, hemorrhage, or mass lesion is present. Mild white matter changes are stable. The ventricles are of normal size. No significant extra-axial fluid collection is present. The brainstem and cerebellum are within normal limits. Vascular: Atherosclerotic calcifications are present in the cavernous internal carotid arteries bilaterally without a hyperdense vessel. Skull: The calvarium is intact. No focal lytic or blastic lesions are present. Sinuses/Orbits: The paranasal sinuses and mastoid air cells are clear. Bilateral lens replacements are present. The globes and orbits are within normal limits. ASPECTS [facility] Stroke Program Early CT Score) - Ganglionic level infarction (caudate, lentiform nuclei, internal capsule, insula, M1-M3 cortex): 7/7 - Supraganglionic infarction (M4-M6 cortex): 3/3 Total score (0-10 with 10 being normal): 10/10 IMPRESSION: 1. No acute intracranial abnormality. 2. Stable atrophy and mild white matter disease, likely within normal limits for age. 3. Atherosclerosis without a hyperdense vessel. 4. ASPECTS is 10/10 These results were called by telephone at the time of interpretation on 01/17/2017 at 11:28 am to Dr. Paschal Dopp , who  verbally acknowledged these results. Electronically Signed   By: San Morelle M.D.   On: 01/17/2017 11:28    EKG: Independently reviewed. Afib, no ACS  Assessment/Plan Active Problems:   Stroke [facility])   TIA (transient ischemic attack)   Dysmetria   Essential hypertension   Diabetes mellitus with complication ([facility])   Permanent atrial fibrillation ([place])    Stroke: Pt continues to complain of numbness on L and  dysmetria noted on L. h/o TIA in past and was recently admitted to [facility] for similar symptoms which resolved. Workup unremarkable at that time other than new onset Afib. Pt was started on Eliquis at that time. No Thrombus noted on Echo from [facility]. CTA w/o  CTA head and neck w/o significant disease.  - Appreciate Neuro recs - MRI (new deficits since previous workup at [facility]) - PT/OT - Change simvastatin to lipitor - ASA - Neuro checks - consider EEG if MRI negative and sx continue to wax and wane - permissive HTN  HTN: Allow permissive HTN. Goal <210/110 - hold atenolol and lisinopril - hydralazine prn  DM: on metformin only. A1c 11/16/16 was 5.9 - SSI  Afib: new onset at last admission at [facility] admission on 4/28. Currently in Afib w/ rate controlled. Echo at that time shows EF of 55% with no concern for thrombus mentioned. - contiue Eliquis - Atenolol on hold until am - Cardiology f/u as outpt   DVT prophylaxis: Eliquis  Code Status: DNR  Family Communication: daughter son and husband  Disposition Plan: pending workup and possible resolution and further PT//OT evaluations  Consults called: neuro  Admission status: observation    Shellye Zandi J MD [facility]  If 7PM-7AM, please contact night-coverage [email or web address] Password [email or web address]  01/17/2017, 2:14 PM

## 2017-01-17 NOTE — Progress Notes (Addendum)
Pt admitted to room after returning from MRI.  She is alert and oriented and very pleasant.  She lives in her home and her grandson lives with her.  She is oriented to room.  Telemetry placed on patient and CCMD called with verification.  She is currently in a-fib.  Call bell and phone within reach and pt verbalizes she understands how to use them.  Bed alarm set.  Denies any complaints of pain. She does state her left side is weak.  Pt is very HOH and hearing aids are at her home.

## 2017-01-17 NOTE — ED Provider Notes (Signed)
Patient seen and evaluated at ambulance entrance. Reporting left-sided weakness since last night. Patient is anticoagulate with a request for atrial fibrillation. Awake. Alert. Mentating well. Stable airway. Appropriate for direct transport to CT   Tanna Furry, MD 01/17/17 1100

## 2017-01-17 NOTE — ED Notes (Signed)
Patient was initially taken to CT 2 and moved to CT 3 d/t technical difficulties with the scanner. Neurologist at bedside.

## 2017-01-17 NOTE — ED Triage Notes (Signed)
Per ems- Pt comes from home LSN at 10 pm by family. This morning she got up at 0330 and had left sided weakness causing trouble with her gait. Problem was still present at 0730. Family called EMS for continued trouble ambulating with her left side. Has hx of TIA. BP 114/56, CBG 132. Afib on eliquis a x 4. 18 gauge to right forearm.

## 2017-01-17 NOTE — Code Documentation (Signed)
84yo female arriving to [facility] at 11 via EMS.  Patient from home where she had left sided deficits.  Patient reports going to bed at 2200 last night.  She woke up at 0330 and had left sided weakness.  Patient reports symptoms improved enough that she was able to get herself dressed.  Patient then experienced a second episode in which she was unable to move her left side.  EMS called at that time and activated a code stroke for unsteady gait.  Stroke team at the bedside on patient arrival. Labs drawn and patient cleared by Dr. Jeneen Rinks.  Patient to CT with team.  CT and CTA head and neck completed.  Delay in imaging d/t machine error in CT 2 requiring patient to be transferred to CT 3 for imaging.  NIHSS 1, see documentation for details and code stroke times.  Patient with left arm ataxia on exam.  Patient with h/o atrial fibrillation on Eliquis, patient's daughter at the bedside and confirms that patient took her medication this morning.  Patient is contraindicated for tPA d/t taking Eliquis.  CTA negative for LVO.  No acute stroke treatment at this time.  Patient to be admitted for stroke work-up.  Handoff with ED RN Jerene Pitch.

## 2017-01-17 NOTE — ED Provider Notes (Signed)
[place] DEPT Provider Note   CSN: [id number] Arrival date & time: 01/17/17  1055   An emergency department physician performed an initial assessment on this suspected stroke patient at [address]).  History   Chief Complaint Chief Complaint  Patient presents with  . Code Stroke    HPI Christy Sutton is a 83 y.o. female with h/o TIA (12/23/16), atrial fibrillation on Eliquis, HTN, HLD, non insulin dependent T2DM is brought to ED for symptoms of stroke.  Per patient's daughter at bedside, patient has been having "TIAs" for a few weeks, with left sided mild weakness that is short lasting since being discharged from hospital on 01/06/17 for TIA. Daughter states that this morning she had her typical "TIA" symptoms that lasted a few second around 3AM.  Patient then demonstrated worsening left sided weakness, more severe than baseline TIA symptoms. Daughter reports facial drooping, left UE limpness and foot drop.  EMS was called and patient was brought to ED.   Of note, patient has been evaluated by neurocritical care physician [facility]) prior to my assessment, he reports all symptoms have been resolved   HPI  Past Medical History:  Diagnosis Date  . Atrial fibrillation ([place])   . Diabetes mellitus without complication ([place])   . Hyperlipidemia   . Hypertension   . TIA (transient ischemic attack)     Patient Active Problem List   Diagnosis Date Noted  . Stroke ([place]) 01/17/2017  . TIA (transient ischemic attack) 01/17/2017  . Dysmetria 01/17/2017  . Essential hypertension 01/17/2017  . Diabetes mellitus with complication ([place]) 40/98/1191  . Permanent atrial fibrillation ([place]) 01/17/2017    Past Surgical History:  Procedure Laterality Date  . ABDOMINAL HYSTERECTOMY    . TUBAL LIGATION      OB History    No data available       Home Medications    Prior to Admission medications   Medication Sig Start Date End Date Taking? Authorizing Provider  aspirin 81 MG chewable  tablet Chew by mouth.    [provider]  atenolol (TENORMIN) 25 MG tablet  06/26/16   [provider]  lisinopril (PRINIVIL,ZESTRIL) 5 MG tablet  07/14/16   [provider]  metFORMIN (GLUCOPHAGE-XR) 500 MG 24 hr tablet TAKE ONE TABLET BY MOUTH ONCE DAILY WITH BREAKFAST 01/25/16   [provider]    Family History Family History  Problem Relation Age of Onset  . Heart attack Father     Social History Social History  Substance Use Topics  . Smoking status: Never Smoker  . Smokeless tobacco: Never Used  . Alcohol use No     Allergies   Penicillins   Review of Systems Review of Systems  Constitutional: Negative for chills and fever.  HENT: Negative for congestion.   Eyes: Negative for photophobia and visual disturbance.  Respiratory: Negative for cough, choking and shortness of breath.   Cardiovascular: Negative for chest pain and palpitations.  Gastrointestinal: Negative for abdominal pain, constipation, diarrhea, nausea and vomiting.  Genitourinary: Negative for difficulty urinating and dysuria.  Musculoskeletal: Positive for gait problem.  Skin: Negative for wound.  Neurological: Positive for facial asymmetry and weakness. Negative for tremors, speech difficulty, numbness and headaches.     Physical Exam Updated Vital Signs BP 124/61   Pulse 66   Temp 98.1 F (36.7 C)   Resp 17   SpO2 99%   Physical Exam  Constitutional: She appears well-developed and well-nourished.  Well appearing. Alert and oriented to self,  place and time. Hard of hearing.  HENT:  Head: Normocephalic and atraumatic.  Nose: Nose normal.  Mouth/Throat: Oropharynx is clear and moist. No oropharyngeal exudate.  Moist mucous membranes  Eyes: Conjunctivae and EOM are normal. Pupils are equal, round, and reactive to light.  Neck: Normal range of motion. Neck supple. No JVD present.  Full active ROM of cervical spine  Cardiovascular: Normal heart sounds and  intact distal pulses.  An irregularly irregular rhythm present.  No murmur heard. Pulses:      Carotid pulses are 2+ on the right side, and 2+ on the left side.      Radial pulses are 2+ on the right side, and 2+ on the left side.       Dorsalis pedis pulses are 2+ on the right side, and 2+ on the left side.  Pulmonary/Chest: Effort normal and breath sounds normal. No respiratory distress. She has no decreased breath sounds. She has no wheezes. She has no rales. She exhibits no tenderness.  Abdominal: Soft. Bowel sounds are normal. There is no tenderness. There is no rebound and no guarding.  Musculoskeletal: Normal range of motion. She exhibits no deformity.  Patient moves UE and LE in coordinated fashion without pain  Lymphadenopathy:    She has no cervical adenopathy.  Neurological:  Pt is alert and oriented.   Speech and phonation normal.   Thought process coherent.   Strength 5/5 in upper and lower extremities.   Sensation to light touch intact in upper and lower extremities.  Gait and Romberg not assessed in trauma room.  No leg drift.  Intact finger to nose test. CN I not tested CN II full visual fields  CN III, IV, VI PEERL and EOM intact CN V light touch intact in all 3 divisions of trigeminal nerve CN VII facial nerve movements intact, symmetric CN VIII hearing intact to finger rub CN IX, X no uvula deviation, symmetric soft palate rise CN XI 5/5 SCM and trapezius strength  CN XII Tongue midline with symmetric L/R movement  Skin: Skin is warm and dry. Capillary refill takes less than 2 seconds.  Psychiatric: She has a normal mood and affect. Her behavior is normal. Judgment and thought content normal.  Nursing note and vitals reviewed.    ED Treatments / Results  Labs (all labs ordered are listed, but only abnormal results are displayed) Labs Reviewed  PROTIME-INR - Abnormal; Notable for the following:       Result Value   Prothrombin Time 33.9 (*)    All other  components within normal limits  APTT - Abnormal; Notable for the following:    aPTT 63 (*)    All other components within normal limits  CBC - Abnormal; Notable for the following:    Platelets 126 (*)    All other components within normal limits  COMPREHENSIVE METABOLIC PANEL - Abnormal; Notable for the following:    Glucose, Bld 126 (*)    Total Protein 6.2 (*)    ALT 13 (*)    All other components within normal limits  I-STAT CHEM 8, ED - Abnormal; Notable for the following:    Glucose, Bld 124 (*)    Calcium, Ion 1.11 (*)    All other components within normal limits  DIFFERENTIAL  I-STAT TROPOININ, ED  CBG MONITORING, ED    EKG  EKG Interpretation None       Radiology Ct Angio Head W Or Wo Contrast  Result Date: 01/17/2017 CLINICAL DATA:  Acute onset of left-sided weakness beginning last night. EXAM: CT ANGIOGRAPHY HEAD AND NECK TECHNIQUE: Multidetector CT imaging of the head and neck was performed using the standard protocol during bolus administration of intravenous contrast. Multiplanar CT image reconstructions and MIPs were obtained to evaluate the vascular anatomy. Carotid stenosis measurements (when applicable) are obtained utilizing NASCET criteria, using the distal internal carotid diameter as the denominator. CONTRAST:  50 mL Isovue 370 COMPARISON:  CT head without contrast from the same day. CTA head and neck 01/05/2017. FINDINGS: CTA NECK FINDINGS Aortic arch: A 3 vessel arch configuration is present. Atherosclerotic calcifications involving the origin of left common carotid artery and the left subclavian artery are stable. There is no significant stenosis. Right carotid system: The right common carotid artery is within normal limits. Slight atherosclerotic irregularity is present at the right carotid bifurcation without a significant stenosis. The cervical right internal carotid artery is mildly tortuous without a focal stenosis. Left carotid system: The left common  carotid artery is within normal limits. Atherosclerotic changes are present at the left carotid bifurcation without a significant stenosis. The cervical left internal carotid artery is normal. Vertebral arteries: The vertebral arteries originate from the subclavian arteries bilaterally. The right vertebral artery is slightly dominant to the left. There is no focal stenosis or vascular injury to either vertebral artery in the neck. Skeleton: Mild spondylosis of the cervical spine is again seen. Other neck: The soft tissues the neck are unremarkable. Upper chest: Pleural-parenchymal scarring is again noted the lung apices. The lungs are otherwise clear. Previously noted edema has improved. Peripheral reticular changes may reflect early interstitial disease. Review of the MIP images confirms the above findings CTA HEAD FINDINGS Anterior circulation: Atherosclerotic calcifications are again seen in the cavernous internal carotid arteries bilaterally without a significant stenosis and sub. There is no aneurysm. The ICA termini are within normal limits bilaterally. The A1 and M1 segments are normal. The anterior communicating artery is patent. The MCA bifurcations are within normal limits. ACA and MCA branch vessels are unremarkable. Posterior circulation: The right vertebral artery is the dominant vessel. PICA origins are visualized and normal. The basilar artery is within normal limits. Both posterior cerebral arteries originate from the basilar tip. The PCA branch vessels are within normal limits. Venous sinuses: The dural sinuses are patent. The transverse sinuses are codominant. The straight sinus and deep cerebral veins are intact. Cortical veins are unremarkable. Anatomic variants: None Delayed phase: The postcontrast images demonstrate no pathologic enhancement. Basal ganglia calcifications are stable. Review of the MIP images confirms the above findings IMPRESSION: 1. No evidence for emerging large vessel  occlusion. 2. Atherosclerotic changes at the aortic arch, bilateral carotid bifurcations, and cavernous internal carotid arteries without significant focal stenosis. 3. Normal variant circle of Willis without significant proximal stenosis, aneurysm, or branch vessel occlusion. 4. Mild multilevel spondylosis of the cervical spine. 5. Question early interstitial lung changes. These results were called by telephone at the time of interpretation on 01/17/2017 at 11:32 am to Dr. Arnaldo Natal , who verbally acknowledged these results. Electronically Signed   By: San Morelle M.D.   On: 01/17/2017 11:55   Ct Angio Neck W And/or Wo Contrast  Result Date: 01/17/2017 CLINICAL DATA:  Acute onset of left-sided weakness beginning last night. EXAM: CT ANGIOGRAPHY HEAD AND NECK TECHNIQUE: Multidetector CT imaging of the head and neck was performed using the standard protocol during bolus administration of intravenous contrast. Multiplanar CT image reconstructions and MIPs were obtained to evaluate  the vascular anatomy. Carotid stenosis measurements (when applicable) are obtained utilizing NASCET criteria, using the distal internal carotid diameter as the denominator. CONTRAST:  50 mL Isovue 370 COMPARISON:  CT head without contrast from the same day. CTA head and neck 01/05/2017. FINDINGS: CTA NECK FINDINGS Aortic arch: A 3 vessel arch configuration is present. Atherosclerotic calcifications involving the origin of left common carotid artery and the left subclavian artery are stable. There is no significant stenosis. Right carotid system: The right common carotid artery is within normal limits. Slight atherosclerotic irregularity is present at the right carotid bifurcation without a significant stenosis. The cervical right internal carotid artery is mildly tortuous without a focal stenosis. Left carotid system: The left common carotid artery is within normal limits. Atherosclerotic changes are present at the left carotid  bifurcation without a significant stenosis. The cervical left internal carotid artery is normal. Vertebral arteries: The vertebral arteries originate from the subclavian arteries bilaterally. The right vertebral artery is slightly dominant to the left. There is no focal stenosis or vascular injury to either vertebral artery in the neck. Skeleton: Mild spondylosis of the cervical spine is again seen. Other neck: The soft tissues the neck are unremarkable. Upper chest: Pleural-parenchymal scarring is again noted the lung apices. The lungs are otherwise clear. Previously noted edema has improved. Peripheral reticular changes may reflect early interstitial disease. Review of the MIP images confirms the above findings CTA HEAD FINDINGS Anterior circulation: Atherosclerotic calcifications are again seen in the cavernous internal carotid arteries bilaterally without a significant stenosis and sub. There is no aneurysm. The ICA termini are within normal limits bilaterally. The A1 and M1 segments are normal. The anterior communicating artery is patent. The MCA bifurcations are within normal limits. ACA and MCA branch vessels are unremarkable. Posterior circulation: The right vertebral artery is the dominant vessel. PICA origins are visualized and normal. The basilar artery is within normal limits. Both posterior cerebral arteries originate from the basilar tip. The PCA branch vessels are within normal limits. Venous sinuses: The dural sinuses are patent. The transverse sinuses are codominant. The straight sinus and deep cerebral veins are intact. Cortical veins are unremarkable. Anatomic variants: None Delayed phase: The postcontrast images demonstrate no pathologic enhancement. Basal ganglia calcifications are stable. Review of the MIP images confirms the above findings IMPRESSION: 1. No evidence for emerging large vessel occlusion. 2. Atherosclerotic changes at the aortic arch, bilateral carotid bifurcations, and cavernous  internal carotid arteries without significant focal stenosis. 3. Normal variant circle of Willis without significant proximal stenosis, aneurysm, or branch vessel occlusion. 4. Mild multilevel spondylosis of the cervical spine. 5. Question early interstitial lung changes. These results were called by telephone at the time of interpretation on 01/17/2017 at 11:32 am to Dr. Arnaldo Natal , who verbally acknowledged these results. Electronically Signed   By: San Morelle M.D.   On: 01/17/2017 11:55   Ct Head Code Stroke W/o Cm  Result Date: 01/17/2017 CLINICAL DATA:  Code stroke. Acute onset of left-sided deficits. Code stroke. Left-sided weakness beginning last night. EXAM: CT HEAD WITHOUT CONTRAST TECHNIQUE: Contiguous axial images were obtained from the base of the skull through the vertex without intravenous contrast. COMPARISON:  CT of the head 01/05/2017.  MRI brain 01/05/2017. FINDINGS: Brain: Bilateral basal ganglia calcifications are again noted, right greater than left. The basal ganglia are otherwise intact. No acute infarct, hemorrhage, or mass lesion is present. Mild white matter changes are stable. The ventricles are of normal size. No significant extra-axial  fluid collection is present. The brainstem and cerebellum are within normal limits. Vascular: Atherosclerotic calcifications are present in the cavernous internal carotid arteries bilaterally without a hyperdense vessel. Skull: The calvarium is intact. No focal lytic or blastic lesions are present. Sinuses/Orbits: The paranasal sinuses and mastoid air cells are clear. Bilateral lens replacements are present. The globes and orbits are within normal limits. ASPECTS [facility] Stroke Program Early CT Score) - Ganglionic level infarction (caudate, lentiform nuclei, internal capsule, insula, M1-M3 cortex): 7/7 - Supraganglionic infarction (M4-M6 cortex): 3/3 Total score (0-10 with 10 being normal): 10/10 IMPRESSION: 1. No acute intracranial  abnormality. 2. Stable atrophy and mild white matter disease, likely within normal limits for age. 3. Atherosclerosis without a hyperdense vessel. 4. ASPECTS is 10/10 These results were called by telephone at the time of interpretation on 01/17/2017 at 11:28 am to Dr. Paschal Dopp , who verbally acknowledged these results. Electronically Signed   By: San Morelle M.D.   On: 01/17/2017 11:28    Procedures Procedures (including critical care time)  CRITICAL CARE Performed by: Kinnie Feil   Total critical care time: 25 min  Critical care time was exclusive of separately billable procedures and treating other patients.  Critical care was necessary to treat or prevent imminent or life-threatening deterioration.  Critical care was time spent personally by me on the following activities: development of treatment plan with patient and/or surrogate as well as nursing, discussions with consultants, evaluation of patient's response to treatment, examination of patient, obtaining history from patient or surrogate, ordering and performing treatments and interventions, ordering and review of laboratory studies, ordering and review of radiographic studies, pulse oximetry and re-evaluation of patient's condition.  Medications Ordered in ED Medications  midazolam (VERSED) injection 1 mg (not administered)  aspirin chewable tablet 81 mg (not administered)  hydrALAZINE (APRESOLINE) injection 5-10 mg (not administered)  insulin aspart (novoLOG) injection 0-9 Units (not administered)  insulin aspart (novoLOG) injection 0-5 Units (not administered)  iopamidol (ISOVUE-370) 76 % injection (50 mLs  Contrast Given 01/17/17 1130)     Initial Impression / Assessment and Plan / ED Course  I have reviewed the triage vital signs and the nursing notes.  Pertinent labs & imaging results that were available during my care of the patient were reviewed by me and considered in my medical decision making (see chart  for details).     Code stroke activated. I evaluated the patient after neuro critical physician (Dr. Paschal Dopp) had performed his physical exam assessment. Upon my exam patient is alert, oriented to self, place and time. She denies pain. She moves her upper and lower extremities and coordinated fashion without pain. Thorough neurological exam is normal, intial symptoms have resolved. I did not assess gait or Romberg. CT head negative for acute vascular pathology. Briefly discussed plan of care with neuro critical physician who requested admission to medicine for MRI and continued workup. Discussed plan with patient and family at bedside, they're agreeable with plan. Requested admission with hospitalist, appreciate Dr. Sharlet Salina assistance. Patient will be admitted after CT Angios.  Patient, ED treatment and discharge plan was discussed with supervising physician who also evaluated the patient and is agreeable with plan.  Final Clinical Impressions(s) / ED Diagnoses   Final diagnoses:  Stroke (cerebrum) [facility])    New Prescriptions Current Discharge Medication List       Arlean Hopping 01/17/17 1415    Tegeler, Gwenyth Allegra, MD 01/20/17 [phone]

## 2017-01-18 DIAGNOSIS — I480 Paroxysmal atrial fibrillation: Secondary | ICD-10-CM | POA: Diagnosis present

## 2017-01-18 DIAGNOSIS — Z8673 Personal history of transient ischemic attack (TIA), and cerebral infarction without residual deficits: Secondary | ICD-10-CM | POA: Diagnosis not present

## 2017-01-18 DIAGNOSIS — Z7984 Long term (current) use of oral hypoglycemic drugs: Secondary | ICD-10-CM | POA: Diagnosis not present

## 2017-01-18 DIAGNOSIS — I10 Essential (primary) hypertension: Secondary | ICD-10-CM | POA: Diagnosis not present

## 2017-01-18 DIAGNOSIS — E118 Type 2 diabetes mellitus with unspecified complications: Secondary | ICD-10-CM | POA: Diagnosis not present

## 2017-01-18 DIAGNOSIS — Z8249 Family history of ischemic heart disease and other diseases of the circulatory system: Secondary | ICD-10-CM | POA: Diagnosis not present

## 2017-01-18 DIAGNOSIS — Z794 Long term (current) use of insulin: Secondary | ICD-10-CM | POA: Diagnosis not present

## 2017-01-18 DIAGNOSIS — R278 Other lack of coordination: Secondary | ICD-10-CM | POA: Diagnosis present

## 2017-01-18 DIAGNOSIS — I63411 Cerebral infarction due to embolism of right middle cerebral artery: Secondary | ICD-10-CM | POA: Diagnosis not present

## 2017-01-18 DIAGNOSIS — I482 Chronic atrial fibrillation: Secondary | ICD-10-CM | POA: Diagnosis present

## 2017-01-18 DIAGNOSIS — Z66 Do not resuscitate: Secondary | ICD-10-CM | POA: Diagnosis present

## 2017-01-18 DIAGNOSIS — Z90710 Acquired absence of both cervix and uterus: Secondary | ICD-10-CM | POA: Diagnosis not present

## 2017-01-18 DIAGNOSIS — H9193 Unspecified hearing loss, bilateral: Secondary | ICD-10-CM | POA: Diagnosis present

## 2017-01-18 DIAGNOSIS — Z880 Allergy status to penicillin: Secondary | ICD-10-CM | POA: Diagnosis not present

## 2017-01-18 DIAGNOSIS — I639 Cerebral infarction, unspecified: Secondary | ICD-10-CM | POA: Diagnosis not present

## 2017-01-18 DIAGNOSIS — E1151 Type 2 diabetes mellitus with diabetic peripheral angiopathy without gangrene: Secondary | ICD-10-CM | POA: Diagnosis present

## 2017-01-18 DIAGNOSIS — E785 Hyperlipidemia, unspecified: Secondary | ICD-10-CM | POA: Diagnosis present

## 2017-01-18 DIAGNOSIS — Z7982 Long term (current) use of aspirin: Secondary | ICD-10-CM | POA: Diagnosis not present

## 2017-01-18 LAB — LIPID PANEL
CHOL/HDL RATIO: 2.1 ratio
Cholesterol: 146 mg/dL (ref 0–200)
HDL: 68 mg/dL (ref >40)
LDL CALC: 63 mg/dL (ref 0–99)
Triglycerides: 75 mg/dL (ref <150)
VLDL: 15 mg/dL (ref 0–40)

## 2017-01-18 LAB — GLUCOSE, CAPILLARY
GLUCOSE-CAPILLARY: 103 mg/dL — AB (ref 65–99)
Glucose-Capillary: 144 mg/dL — ABNORMAL HIGH (ref 65–99)
Glucose-Capillary: 104 mg/dL — ABNORMAL HIGH (ref 65–99)

## 2017-01-18 MED ORDER — ASPIRIN 81 MG PO CHEW
81.0000 mg | CHEWABLE_TABLET | Freq: Every day | ORAL | Status: DC
  Administered 2017-01-18 – 2017-01-19 (×2): 81 mg via ORAL
  Filled 2017-01-18 (×2): qty 1

## 2017-01-18 NOTE — Evaluation (Signed)
Occupational Therapy Evaluation Patient Details Name: Christy Sutton MRN: [id number] DOB: 1925-09-24 Today's Date: 01/18/2017    History of Present Illness 83 y.o. F with PMHx significant for afib on Eliquis, woke up at 4am with L sided weakness and gait disturbance, pt went back to bed then was seen by her daughter at 730 am who noticed the left sided weakness and decided to call 911 and bring pt to ED, in ED pt is back to baseline, CT/CTA showed no acute changes; MRI shows acute nonhemorrhagic 7 mm white matter infarcts involving the posterior limb of the right internal capsule   Clinical Impression   This 83 y/o F presents with the above. Pt is ModI at baseline with ADLs and functional mobility. Pt with L side weakness, currently requiring MinA to supervision for room level functional mobility and seated/standing ADLs. Pt easily distracted and requires verbal cues to stay on task. Pt's family is able to provide 24 hr assist PRN after discharge home. Pt will benefit from continued OT services while in acute setting and after return home to increase safety and independence with ADLs and functional mobility.     Follow Up Recommendations  Home health OT;Supervision/Assistance - 24 hour    Equipment Recommendations  3 in 1 bedside commode           Precautions / Restrictions Precautions Precautions: Fall Restrictions Weight Bearing Restrictions: No      Mobility Bed Mobility Overal bed mobility: Needs Assistance Bed Mobility: Supine to Sit     Supine to sit: Supervision        Transfers Overall transfer level: Needs assistance Equipment used: Rolling walker (2 wheeled) Transfers: Sit to/from Stand Sit to Stand: Min guard;Min assist         General transfer comment: for safety; for assist to balance upon initial standing, Pt with tendency to lean posteriorly during initial standing, requires verbal cues to self correct; weakness noted on L side during functional mobility      Balance Overall balance assessment: Needs assistance Sitting-balance support: No upper extremity supported;Feet supported Sitting balance-Leahy Scale: Fair     Standing balance support: Bilateral upper extremity supported;During functional activity Standing balance-Leahy Scale: Poor Standing balance comment: requires support to maintain balance during functional activity; Pt with decreased awareness of LOB                            ADL either performed or assessed with clinical judgement   ADL Overall ADL's : Needs assistance/impaired Eating/Feeding: Sitting;Set up   Grooming: Wash/dry hands;Min guard;Standing   Upper Body Bathing: Sitting;Set up   Lower Body Bathing: Sit to/from stand;Minimal assistance   Upper Body Dressing : Set up;Sitting Upper Body Dressing Details (indicate cue type and reason): donning gown  Lower Body Dressing: Set up;Bed level Lower Body Dressing Details (indicate cue type and reason): donning socks Toilet Transfer: Minimal assistance;Ambulation;Regular Toilet;RW   Toileting- Clothing Manipulation and Hygiene: Minimal assistance;Sit to/from stand       Functional mobility during ADLs: Min guard;Minimal assistance;Rolling walker       Vision Baseline Vision/History: No visual deficits Patient Visual Report: No change from baseline Vision Assessment?: Yes;No apparent visual deficits Ocular Range of Motion: Within Functional Limits Tracking/Visual Pursuits: Able to track stimulus in all quads without difficulty     Perception     Praxis      Pertinent Vitals/Pain Pain Assessment: No/denies pain     Hand Dominance  Extremity/Trunk Assessment Upper Extremity Assessment Upper Extremity Assessment: Generalized weakness;LUE deficits/detail LUE Deficits / Details: LUE weakness (L>R)           Communication Communication Communication: HOH   Cognition Arousal/Alertness: Awake/alert Behavior During Therapy: WFL for  tasks assessed/performed Overall Cognitive Status: Within Functional Limits for tasks assessed                                 General Comments: Pt very easily distracted, requires Max verbal cues to stay on task; Pt reports no differences in cognition from baseline    General Comments                  Home Living Family/patient expects to be discharged to:: Private residence Living Arrangements: Other relatives (grandson) Available Help at Discharge: Family;Available 24 hours/day Type of Home: House Home Access: Stairs to enter [employer] of Steps: 5/6 Entrance Stairs-Rails: Right;Left Home Layout: One level     Bathroom Shower/Tub: Occupational psychologist: Standard Bathroom Accessibility: Yes How Accessible: Accessible via walker Home Equipment: Shower seat - built in   Additional Comments: walker      Prior Functioning/Environment Level of Independence: Independent with assistive device(s)                 OT Problem List: Decreased strength;Impaired balance (sitting and/or standing);Decreased activity tolerance;Decreased safety awareness;Decreased knowledge of use of DME or AE      OT Treatment/Interventions: Self-care/ADL training;Therapeutic exercise;DME and/or AE instruction;Therapeutic activities;Patient/family education;Balance training    OT Goals(Current goals can be found in the care plan section) Acute Rehab OT Goals Patient Stated Goal: to continue being independent  OT Goal Formulation: With patient Time For Goal Achievement: 01/25/17 Potential to Achieve Goals: Good ADL Goals Pt Will Perform Grooming: with modified independence;standing Pt Will Perform Lower Body Dressing: with min guard assist;sit to/from stand Pt Will Transfer to Toilet: with supervision;ambulating;regular height toilet Pt Will Perform Toileting - Clothing Manipulation and hygiene: with supervision;sit to/from stand Pt/caregiver will  Perform Home Exercise Program: Left upper extremity;With theraband;With written HEP provided;Increased strength  OT Frequency: Min 2X/week                             AM-PAC PT "6 Clicks" Daily Activity     Outcome Measure Help from another person eating meals?: None Help from another person taking care of personal grooming?: A Little Help from another person toileting, which includes using toliet, bedpan, or urinal?: A Little Help from another person bathing (including washing, rinsing, drying)?: A Little Help from another person to put on and taking off regular upper body clothing?: A Little Help from another person to put on and taking off regular lower body clothing?: A Little 6 Click Score: 19   End of Session Equipment Utilized During Treatment: Gait belt;Rolling walker Nurse Communication: Mobility status  Activity Tolerance: Patient tolerated treatment well Patient left: in chair;with call bell/phone within reach;with chair alarm set  OT Visit Diagnosis: Muscle weakness (generalized) (M62.81);Unsteadiness on feet (R26.81);Other abnormalities of gait and mobility (R26.89)                Time: 8502-7741 OT Time Calculation (min): 65 min Charges:  OT General Charges $OT Visit: 1 Procedure OT Evaluation $OT Eval Moderate Complexity: 1 Procedure OT Treatments $Self Care/Home Management : 23-37 mins G-Codes: OT G-codes **NOT FOR INPATIENT  CLASS** Functional Assessment Tool Used: AM-PAC 6 Clicks Daily Activity;Clinical judgement Functional Limitation: Self care Self Care Current Status (A2567): At least 40 percent but less than 60 percent impaired, limited or restricted Self Care Goal Status (C0919): At least 20 percent but less than 40 percent impaired, limited or restricted   Christy Sutton, OT Pager [phone] 01/18/2017   Christy Sutton 01/18/2017, 2:53 PM

## 2017-01-18 NOTE — Care Management Note (Signed)
Case Management Note  Patient Details  Name: Christy Sutton MRN: [id number] Date of Birth: 31-Jan-1926  Subjective/Objective:   Pt admitted with CVA. She is from home with her grandson.                  Action/Plan: Awaiting PT recommendations. OT recommending HH. CM following for d/c needs, physician orders.   Expected Discharge Date:                  Expected Discharge Plan:  [name]  In-House Referral:     Discharge planning Services  CM Consult  Post Acute Care Choice:    Choice offered to:     DME Arranged:    DME Agency:     HH Arranged:    [place] Agency:     Status of Service:  In process, will continue to follow  If discussed at Long Length of Stay Meetings, dates discussed:    Additional Comments:  Pollie Friar, RN 01/18/2017, 3:31 PM

## 2017-01-18 NOTE — Progress Notes (Signed)
STROKE TEAM PROGRESS NOTE   HISTORY OF PRESENT ILLNESS (per record) Christy Sutton is an 83 y.o. female with PMH significant for afib on Eliquis (last dose taken this AM confirmed by daughter), who was last known well at 10 pm last night 01/16/2017 when she went to bed then woke up this morning at 4 am with L sided weakness and gait disturbance, pt went back to bed then was seen by her daughter at 730 am who noticed the left sided weakness and decided to call 911 and bring pt to ED, in ED pt is back to baseline, CT/CTA showed no acute changes ( there was small delay in getting CTH since the firt CT scanner was not working and we had to move pt to the 2nd scanner), pt was not given IVTPA since she was outside the window and took eliquis this morning , no intervention due to no LVO on CTA. Modified Rankin: 0. Patient was not administered IV t-PA. She was admitted for further evaluation and treatment.   SUBJECTIVE (INTERVAL HISTORY) No family at bedside. She can not tell me exactly what medication she is on and she is hard of hearing. She gave me her daughter in law Christy Sutton's number to call. I called Christy Sutton and she told me pt medication at home. Apparently she is on eliquis 5mg  bid and ASA 81mg  daily since 01/05/17 last admission to [facility].    OBJECTIVE Temp:  [97.6 F (36.4 C)-98.4 F (36.9 C)] 97.8 F (36.6 C) (05/10 0400) Pulse Rate:  [48-105] 60 (05/10 0400) Cardiac Rhythm: Atrial fibrillation (05/10 0700) Resp:  [11-20] 16 (05/10 0400) BP: (116-146)/(46-94) 141/49 (05/10 0400) SpO2:  [95 %-99 %] 96 % (05/10 0400) FiO2 (%):  [21 %] 21 % (05/09 1357)  CBC:  Recent Labs Lab 01/17/17 1058 01/17/17 1105  WBC 5.4  --   NEUTROABS 4.0  --   HGB 12.5 12.2  HCT 36.7 36.0  MCV 89.7  --   PLT 126*  --     Basic Metabolic Panel:  Recent Labs Lab 01/17/17 1058 01/17/17 1105  NA 139 141  K 3.8 3.9  CL 107 105  CO2 22  --   GLUCOSE 126* 124*  BUN 9 10  CREATININE 0.78 0.70  CALCIUM 9.1   --     Lipid Panel:    Component Value Date/Time   CHOL 146 01/18/2017 0756   TRIG 75 01/18/2017 0756   HDL 68 01/18/2017 0756   CHOLHDL 2.1 01/18/2017 0756   VLDL 15 01/18/2017 0756   LDLCALC 63 01/18/2017 0756   HgbA1c: No results found for: HGBA1C Urine Drug Screen: No results found for: LABOPIA, COCAINSCRNUR, LABBENZ, AMPHETMU, THCU, LABBARB  Alcohol Level No results found for: [place] I have personally reviewed the radiological images below and agree with the radiology interpretations.  Ct Head Code Stroke W/o Cm 01/17/2017 1. No acute intracranial abnormality. 2. Stable atrophy and mild white matter disease, likely within normal limits for age. 3. Atherosclerosis without a hyperdense vessel. 4. ASPECTS is 10/10   Ct Angio Head W Or Wo Contrast Ct Angio Neck W And/or Wo Contrast 01/17/2017 1. No evidence for emerging large vessel occlusion. 2. Atherosclerotic changes at the aortic arch, bilateral carotid bifurcations, and cavernous internal carotid arteries without significant focal stenosis. 3. Normal variant circle of Willis without significant proximal stenosis, aneurysm, or branch vessel occlusion. 4. Mild multilevel spondylosis of the cervical spine. 5. Question early interstitial lung changes.   Mr  Brain Wo Contrast 01/17/2017 1. Acute nonhemorrhagic 7 mm white matter infarcts involving the posterior limb of the right internal capsule, just lateral to the area of chronic calcification. 2. Otherwise stable mild generalized atrophy without other significant white matter disease. 3. Chronic opacification of the right sphenoid sinus. 4. Bilateral mastoid effusions without obstructing nasopharyngeal lesion. 5. Degenerative changes within the upper cervical spine.   TTE 01/05/17 Ejection fraction is visually estimated at 55-60% Normal left ventricular systolic function Mild LVH Mild mitral regurgitation. Mild tricuspid regurgitation by color flow doppler  examination. Signature   PHYSICAL EXAM  Temp:  [97.6 F (36.4 C)-98.4 F (36.9 C)] 98 F (36.7 C) (05/10 1355) Pulse Rate:  [48-105] 67 (05/10 1355) Resp:  [16-20] 16 (05/10 1355) BP: (116-146)/(46-85) 117/66 (05/10 1355) SpO2:  [95 %-98 %] 96 % (05/10 1355)  General - Well nourished, well developed, in no apparent distress.  Ophthalmologic - Fundi not visualized due to noncooperation.  Cardiovascular - irregularly irregular heart rate and rhythm.  Mental Status -  Level of arousal and orientation to time, place, and person were intact. Language including expression, naming, repetition, comprehension was assessed and found intact. Fund of Knowledge was assessed and was intact.  Cranial Nerves II - XII - II - Visual field intact OU. III, IV, VI - Extraocular movements intact. V - Facial sensation intact bilaterally. VII - Facial movement intact bilaterally. VIII - Hard of hearing & vestibular intact bilaterally. X - Palate elevates symmetrically. XI - Chin turning & shoulder shrug intact bilaterally. XII - Tongue protrusion intact.  Motor Strength - The patient's strength was normal in all extremities and pronator drift was absent.  Bulk was normal and fasciculations were absent.   Motor Tone - Muscle tone was assessed at the neck and appendages and was normal.  Reflexes - The patient's reflexes were 1+ in all extremities and she had no pathological reflexes.  Sensory - Light touch, temperature/pinprick were assessed and were symmetrical.    Coordination - The patient had normal movements in the hands with no ataxia or dysmetria.  Tremor was absent.  Gait and Station - deferred   ASSESSMENT/PLAN [name] is a 83 y.o. female with history of AF on eliquis, HTN, HLD, DB and TIA presenting with recurrent L-sided numbness and weakness. She did not receive IV t-PA.   Stroke:  Small right PLIC infarct most likely secondary to known recently diagnosed atrial  fibrillation on eliquis and ASA  Resultant  Deficit resolved  Code Stroke CT no acute stroke. Atrophy. small vessel disease. Atherosclerosis. Aspects 10.    CTA H&N  No ELVO. Atherosclerosis w/o sign stenosis.   MRI  R small PLIC infarct  2D Echo  01/05/17 EF 55-60%, mild LVH, MR, TR  LDL 63  HgbA1c pending  Eliquis for VTE prophylaxis  Diet regular Room service appropriate? Yes; Fluid consistency: Thin  aspirin 81 mg daily and eliquis 5mg  bid prior to admission, now on Eliquis (apixaban) daily and ASA 81mg  daily. Continue eliquis and ASA on discharge.   Patient counseled to be compliant with her antithrombotic medications  Ongoing aggressive stroke risk factor management  Therapy recommendations:  pending   Disposition:  pending   Atrial Fibrillation  Home anticoagulation:  Eliquis (apixaban) daily continued in the hospital  Just started on eliquis on 01/05/17  Also on ASA 81 at home  Continue eliquis and ASA at discharge    Hypertension  Stable Permissive hypertension (OK if < 180/105) but gradually normalize  in 5-7 days Long-term BP goal normotensive  Hyperlipidemia  Home meds:  zocor 40  Changed to lipitor 20 in hospital  LDL 63, goal < 70  Continue statin at discharge   Other Stroke Risk Factors  Advanced age  Hx stroke/TIA  01/05/17 R brain TIA, L sided weakness, MRI neg, CTA h/n neg - put on eliquis after found afib  12/23/16 R brain TIA (L sided weakness s/p fall)  Hospital day # 0  Neurology will sign off. Please call with questions. Pt will follow up with stroke clinic at [facility] in about 6 weeks. Thanks for the consult.  Rosalin Hawking, MD PhD Stroke Neurology 01/18/2017 3:32 PM   To contact Stroke Continuity provider, please refer to [email or web address] After hours, contact General Neurology

## 2017-01-18 NOTE — Evaluation (Signed)
Physical Therapy Evaluation Patient Details Name: Christy Sutton MRN: [id number] DOB: 1926-08-17 Today's Date: 01/18/2017   History of Present Illness  83 y.o. F with PMHx significant for afib on Eliquis, woke up at 4am with L sided weakness and gait disturbance, pt went back to bed then was seen by her daughter at 730 am who noticed the left sided weakness and decided to call 911 and bring pt to ED, in ED pt is back to baseline, CT/CTA showed no acute changes; MRI shows acute nonhemorrhagic 7 mm white matter infarcts involving the posterior limb of the right internal capsule  Clinical Impression  Pt presented sitting OOB in recliner chair, awake and willing to participate in therapy session. Prior to admission, pt reported that she ambulated with use of RW and was independent with ADLs. Pt ambulated in hallway with RW and min guard to min A for safety and stability. Pt would continue to benefit from skilled physical therapy services at this time while admitted and after d/c to address the below listed limitations in order to improve overall safety and independence with functional mobility.     Follow Up Recommendations Home health PT;Supervision/Assistance - 24 hour    Equipment Recommendations  None recommended by PT    Recommendations for Other Services       Precautions / Restrictions Precautions Precautions: Fall Restrictions Weight Bearing Restrictions: No      Mobility  Bed Mobility Overal bed mobility: Needs Assistance Bed Mobility: Supine to Sit     Supine to sit: Supervision     General bed mobility comments: pt sitting OOB in recliner chair when therapist entered room  Transfers Overall transfer level: Needs assistance Equipment used: Rolling walker (2 wheeled) Transfers: Sit to/from Stand Sit to Stand: Min guard         General transfer comment: increased time, vc'ing for bilateral hand placement and min guard for safety  Ambulation/Gait Ambulation/Gait  assistance: Min guard;Min assist Ambulation Distance (Feet): 50 Feet Assistive device: Rolling walker (2 wheeled) Gait Pattern/deviations: Step-through pattern;Decreased stride length;Trunk flexed Gait velocity: WFL Gait velocity interpretation: at or above normal speed for age/gender General Gait Details: pt with modest instability with ambulation but no overt LOB, close min guard for safety with RW and occasional min A to maintain a safe distance from [employer] Mobility    Modified Rankin (Stroke Patients Only) Modified Rankin (Stroke Patients Only) Pre-Morbid Rankin Score: Moderately severe disability Modified Rankin: Moderately severe disability     Balance Overall balance assessment: Needs assistance Sitting-balance support: No upper extremity supported;Feet supported Sitting balance-Leahy Scale: Fair     Standing balance support: During functional activity;Single extremity supported Standing balance-Leahy Scale: Poor Standing balance comment: pt reliant on at least one UE support on RW                             Pertinent Vitals/Pain Pain Assessment: No/denies pain    Home Living Family/patient expects to be discharged to:: Private residence Living Arrangements: Other relatives (grandson) Available Help at Discharge: Family;Available PRN/intermittently Type of Home: House Home Access: Stairs to enter Entrance Stairs-Rails: Right Entrance Stairs-Number of Steps: 5 Home Layout: One level Home Equipment: Clinical cytogeneticist - 2 wheels Additional Comments: walker    Prior Function Level of Independence: Independent with assistive device(s)         Comments: pt ambulates with use  of RW     Hand Dominance   Dominant Hand: Right    Extremity/Trunk Assessment   Upper Extremity Assessment Upper Extremity Assessment: Defer to OT evaluation LUE Deficits / Details: LUE weakness (L>R)    Lower Extremity  Assessment Lower Extremity Assessment: Generalized weakness;RLE deficits/detail;LLE deficits/detail RLE Deficits / Details: sensation grossly intact. MMT revealed 3+/5 for hip flexion, knee flexion, knee extension and ankle DF RLE Coordination: decreased gross motor LLE Deficits / Details: sensation grossly intact. MMT revealed 3+/5 for hip flexion, knee flexion, knee extension and ankle DF LLE Coordination: decreased gross motor       Communication   Communication: HOH  Cognition Arousal/Alertness: Awake/alert Behavior During Therapy: WFL for tasks assessed/performed Overall Cognitive Status: Impaired/Different from baseline Area of Impairment: Attention;Memory;Following commands;Safety/judgement                   Current Attention Level: Sustained Memory: Decreased short-term memory Following Commands: Follows one step commands inconsistently;Follows one step commands with increased time Safety/Judgement: Decreased awareness of deficits;Decreased awareness of safety     General Comments: Pt very easily distracted, requires Max verbal cues to stay on task; Pt reports no differences in cognition from baseline       General Comments      Exercises     Assessment/Plan    PT Assessment Patient needs continued PT services  PT Problem List Decreased strength;Decreased balance;Decreased mobility;Decreased coordination;Decreased cognition;Decreased knowledge of use of DME;Decreased safety awareness       PT Treatment Interventions DME instruction;Stair training;Gait training;Functional mobility training;Therapeutic activities;Therapeutic exercise;Balance training;Neuromuscular re-education;Cognitive remediation;Patient/family education    PT Goals (Current goals can be found in the Care Plan section)  Acute Rehab PT Goals Patient Stated Goal: return home PT Goal Formulation: With patient Time For Goal Achievement: 02/01/17 Potential to Achieve Goals: Good    Frequency  Min 4X/week   Barriers to discharge        Co-evaluation               AM-PAC PT "6 Clicks" Daily Activity  Outcome Measure Difficulty turning over in bed (including adjusting bedclothes, sheets and blankets)?: A Little Difficulty moving from lying on back to sitting on the side of the bed? : A Little Difficulty sitting down on and standing up from a chair with arms (e.g., wheelchair, bedside commode, etc,.)?: A Little Help needed moving to and from a bed to chair (including a wheelchair)?: A Little Help needed walking in hospital room?: A Little Help needed climbing 3-5 steps with a railing? : A Lot 6 Click Score: 17    End of Session Equipment Utilized During Treatment: Gait belt Activity Tolerance: Patient tolerated treatment well Patient left: in chair;with call bell/phone within reach;with chair alarm set;Other (comment) (RN and nurse tech present in room) Nurse Communication: Mobility status PT Visit Diagnosis: Unsteadiness on feet (R26.81);Other abnormalities of gait and mobility (R26.89);Other symptoms and signs involving the nervous system (R29.898)    Time: 5366-4403 PT Time Calculation (min) (ACUTE ONLY): 18 min   Charges:   PT Evaluation $PT Eval Moderate Complexity: 1 Procedure     PT G Codes:        Sherie Don, PT, DPT [place] 01/18/2017, 4:30 PM

## 2017-01-18 NOTE — Progress Notes (Signed)
PROGRESS NOTE    Christy Sutton  EVO:[id number] DOB: [birth year] DOA: 01/17/2017 PCP: System, Pcp Not In   Brief Narrative: 83 year old female with history of diabetes, hyperlipidemia, hypertension, TIAs, atrial fibrillation on chronic anticoagulation presented with right-sided weakness and numbness. Found to have acute ischemic stroke. Neurology eval is ongoing.  Assessment & Plan:  # Acute ischemic stroke involving posterior limb of right internal capsule: -CT angiogram of head and neck with no critical blood vessel stenosis. -Patient had echo done on 4/27 at [facility] showed EF of 55-60% and normal left ventricular systolic function. Defer to neurologist if patient needs another echocardiogram vs TEE. -LDL 63, A1c pending -Continue eliquis, lipitor -PT, OT evaluation and treatment -Follow-up neurologist  #Paroxysmal atrial fibrillation: Monitor heart rate. On eliquis for systemic anticoagulation. Recommended outpatient follow-up with cardiologist. Holding atenolol because of acute stroke  #Essential hypertension: Monitor blood pressure. Lisinopril on hold.  #Type 2 diabetes: Follow-up A1c. Continue current insulin regimen. Monitor blood sugar level.   Active Problems:   Stroke [facility])   TIA (transient ischemic attack)   Dysmetria   Essential hypertension   Diabetes mellitus with complication (HCC)  atrial fibrillation ([place])   Ischemic stroke ([place])  DVT prophylaxis:eliquis Code Status:DNR Family Communication: Discussed with the patient's son at bedside Disposition Plan: Likely discharge home with home care versus rehabilitation in 1-2 days    Consultants:   Neurologist  Procedures: CTA, MRI Antimicrobials: None  Subjective: Patient was seen and examined at bedside. Denied headache, dizziness, nausea, vomiting, chest pain, shortness of breath.  Objective: Vitals:   01/18/17 0200 01/18/17 0400 01/18/17 1130 01/18/17 1355  BP: 137/64 (!) 141/49 (!) 118/59 117/66  Pulse: (!)  48 60 (!) 57 67  Resp: 16 16 17 16   Temp:  97.8 F (36.6 C) 98.4 F (36.9 C) 98 F (36.7 C)  TempSrc:  Oral Oral Oral  SpO2: 96% 96% 95% 96%    Intake/Output Summary (Last 24 hours) at 01/18/17 1502 Last data filed at 01/17/17 1600  Gross per 24 hour  Intake              240 ml  Output                0 ml  Net              240 ml   There were no vitals filed for this visit.  Examination:  General exam: Appears calm and comfortable  Respiratory system: Clear to auscultation. Respiratory effort normal. No wheezing or crackle Cardiovascular system: S1 & S2 heard, RRR.  No pedal edema. Gastrointestinal system: Abdomen is nondistended, soft and nontender. Normal bowel sounds heard. Central nervous system: Alert and oriented. No focal neurological deficits. Extremities: Symmetric 5 x 5 power. Skin: No rashes, lesions or ulcers Psychiatry: Judgement and insight appear normal. Mood & affect appropriate.     Data Reviewed: I have personally reviewed following labs and imaging studies  CBC:  Recent Labs Lab 01/17/17 1058 01/17/17 1105  WBC 5.4  --   NEUTROABS 4.0  --   HGB 12.5 12.2  HCT 36.7 36.0  MCV 89.7  --   PLT 126*  --    Basic Metabolic Panel:  Recent Labs Lab 01/17/17 1058 01/17/17 1105  NA 139 141  K 3.8 3.9  CL 107 105  CO2 22  --   GLUCOSE 126* 124*  BUN 9 10  CREATININE 0.78 0.70  CALCIUM 9.1  --    GFR: CrCl cannot  be calculated (Unknown ideal weight.). Liver Function Tests:  Recent Labs Lab 01/17/17 1058  AST 25  ALT 13*  ALKPHOS 38  BILITOT 1.0  PROT 6.2*  ALBUMIN 3.9   No results for input(s): LIPASE, AMYLASE in the last 168 hours. No results for input(s): AMMONIA in the last 168 hours. Coagulation Profile:  Recent Labs Lab 01/17/17 1058  INR 3.25   Cardiac Enzymes: No results for input(s): CKTOTAL, CKMB, CKMBINDEX, TROPONINI in the last 168 hours. BNP (last 3 results) No results for input(s): PROBNP in the last 8760  hours. HbA1C: No results for input(s): HGBA1C in the last 72 hours. CBG:  Recent Labs Lab 01/17/17 1159 01/17/17 1653 01/17/17 2104 01/18/17 1126  GLUCAP 98 162* 95 103*   Lipid Profile:  Recent Labs  01/18/17 0756  CHOL 146  HDL 68  LDLCALC 63  TRIG 75  CHOLHDL 2.1   Thyroid Function Tests: No results for input(s): TSH, T4TOTAL, FREET4, T3FREE, THYROIDAB in the last 72 hours. Anemia Panel: No results for input(s): VITAMINB12, FOLATE, FERRITIN, TIBC, IRON, RETICCTPCT in the last 72 hours. Sepsis Labs: No results for input(s): PROCALCITON, LATICACIDVEN in the last 168 hours.  No results found for this or any previous visit (from the past 240 hour(s)).       Radiology Studies: Ct Angio Head W Or Wo Contrast  Result Date: 01/17/2017 CLINICAL DATA:  Acute onset of left-sided weakness beginning last night. EXAM: CT ANGIOGRAPHY HEAD AND NECK TECHNIQUE: Multidetector CT imaging of the head and neck was performed using the standard protocol during bolus administration of intravenous contrast. Multiplanar CT image reconstructions and MIPs were obtained to evaluate the vascular anatomy. Carotid stenosis measurements (when applicable) are obtained utilizing NASCET criteria, using the distal internal carotid diameter as the denominator. CONTRAST:  50 mL Isovue 370 COMPARISON:  CT head without contrast from the same day. CTA head and neck 01/05/2017. FINDINGS: CTA NECK FINDINGS Aortic arch: A 3 vessel arch configuration is present. Atherosclerotic calcifications involving the origin of left common carotid artery and the left subclavian artery are stable. There is no significant stenosis. Right carotid system: The right common carotid artery is within normal limits. Slight atherosclerotic irregularity is present at the right carotid bifurcation without a significant stenosis. The cervical right internal carotid artery is mildly tortuous without a focal stenosis. Left carotid system: The left  common carotid artery is within normal limits. Atherosclerotic changes are present at the left carotid bifurcation without a significant stenosis. The cervical left internal carotid artery is normal. Vertebral arteries: The vertebral arteries originate from the subclavian arteries bilaterally. The right vertebral artery is slightly dominant to the left. There is no focal stenosis or vascular injury to either vertebral artery in the neck. Skeleton: Mild spondylosis of the cervical spine is again seen. Other neck: The soft tissues the neck are unremarkable. Upper chest: Pleural-parenchymal scarring is again noted the lung apices. The lungs are otherwise clear. Previously noted edema has improved. Peripheral reticular changes may reflect early interstitial disease. Review of the MIP images confirms the above findings CTA HEAD FINDINGS Anterior circulation: Atherosclerotic calcifications are again seen in the cavernous internal carotid arteries bilaterally without a significant stenosis and sub. There is no aneurysm. The ICA termini are within normal limits bilaterally. The A1 and M1 segments are normal. The anterior communicating artery is patent. The MCA bifurcations are within normal limits. ACA and MCA branch vessels are unremarkable. Posterior circulation: The right vertebral artery is the dominant vessel. PICA  origins are visualized and normal. The basilar artery is within normal limits. Both posterior cerebral arteries originate from the basilar tip. The PCA branch vessels are within normal limits. Venous sinuses: The dural sinuses are patent. The transverse sinuses are codominant. The straight sinus and deep cerebral veins are intact. Cortical veins are unremarkable. Anatomic variants: None Delayed phase: The postcontrast images demonstrate no pathologic enhancement. Basal ganglia calcifications are stable. Review of the MIP images confirms the above findings IMPRESSION: 1. No evidence for emerging large vessel  occlusion. 2. Atherosclerotic changes at the aortic arch, bilateral carotid bifurcations, and cavernous internal carotid arteries without significant focal stenosis. 3. Normal variant circle of Willis without significant proximal stenosis, aneurysm, or branch vessel occlusion. 4. Mild multilevel spondylosis of the cervical spine. 5. Question early interstitial lung changes. These results were called by telephone at the time of interpretation on 01/17/2017 at 11:32 am to Dr. Arnaldo Natal , who verbally acknowledged these results. Electronically Signed   By: San Morelle M.D.   On: 01/17/2017 11:55   Ct Angio Neck W And/or Wo Contrast  Result Date: 01/17/2017 CLINICAL DATA:  Acute onset of left-sided weakness beginning last night. EXAM: CT ANGIOGRAPHY HEAD AND NECK TECHNIQUE: Multidetector CT imaging of the head and neck was performed using the standard protocol during bolus administration of intravenous contrast. Multiplanar CT image reconstructions and MIPs were obtained to evaluate the vascular anatomy. Carotid stenosis measurements (when applicable) are obtained utilizing NASCET criteria, using the distal internal carotid diameter as the denominator. CONTRAST:  50 mL Isovue 370 COMPARISON:  CT head without contrast from the same day. CTA head and neck 01/05/2017. FINDINGS: CTA NECK FINDINGS Aortic arch: A 3 vessel arch configuration is present. Atherosclerotic calcifications involving the origin of left common carotid artery and the left subclavian artery are stable. There is no significant stenosis. Right carotid system: The right common carotid artery is within normal limits. Slight atherosclerotic irregularity is present at the right carotid bifurcation without a significant stenosis. The cervical right internal carotid artery is mildly tortuous without a focal stenosis. Left carotid system: The left common carotid artery is within normal limits. Atherosclerotic changes are present at the left carotid  bifurcation without a significant stenosis. The cervical left internal carotid artery is normal. Vertebral arteries: The vertebral arteries originate from the subclavian arteries bilaterally. The right vertebral artery is slightly dominant to the left. There is no focal stenosis or vascular injury to either vertebral artery in the neck. Skeleton: Mild spondylosis of the cervical spine is again seen. Other neck: The soft tissues the neck are unremarkable. Upper chest: Pleural-parenchymal scarring is again noted the lung apices. The lungs are otherwise clear. Previously noted edema has improved. Peripheral reticular changes may reflect early interstitial disease. Review of the MIP images confirms the above findings CTA HEAD FINDINGS Anterior circulation: Atherosclerotic calcifications are again seen in the cavernous internal carotid arteries bilaterally without a significant stenosis and sub. There is no aneurysm. The ICA termini are within normal limits bilaterally. The A1 and M1 segments are normal. The anterior communicating artery is patent. The MCA bifurcations are within normal limits. ACA and MCA branch vessels are unremarkable. Posterior circulation: The right vertebral artery is the dominant vessel. PICA origins are visualized and normal. The basilar artery is within normal limits. Both posterior cerebral arteries originate from the basilar tip. The PCA branch vessels are within normal limits. Venous sinuses: The dural sinuses are patent. The transverse sinuses are codominant. The straight sinus and  deep cerebral veins are intact. Cortical veins are unremarkable. Anatomic variants: None Delayed phase: The postcontrast images demonstrate no pathologic enhancement. Basal ganglia calcifications are stable. Review of the MIP images confirms the above findings IMPRESSION: 1. No evidence for emerging large vessel occlusion. 2. Atherosclerotic changes at the aortic arch, bilateral carotid bifurcations, and cavernous  internal carotid arteries without significant focal stenosis. 3. Normal variant circle of Willis without significant proximal stenosis, aneurysm, or branch vessel occlusion. 4. Mild multilevel spondylosis of the cervical spine. 5. Question early interstitial lung changes. These results were called by telephone at the time of interpretation on 01/17/2017 at 11:32 am to Dr. Arnaldo Natal , who verbally acknowledged these results. Electronically Signed   By: San Morelle M.D.   On: 01/17/2017 11:55   Mr Brain Wo Contrast  Result Date: 01/17/2017 CLINICAL DATA:  TIA versus stroke. Episodes of left-sided weakness since discharge from hospital 01/06/2017. EXAM: MRI HEAD WITHOUT CONTRAST TECHNIQUE: Multiplanar, multiecho pulse sequences of the brain and surrounding structures were obtained without intravenous contrast. COMPARISON:  MRI of the brain 01/05/2017. CTA of the head and neck from the same day. FINDINGS: Brain: A focal area of restricted diffusion is present along the posterior limb of the right internal capsule, and just lateral to the area of calcification. No other acute or focal infarct is present. Associated T2 changes are present. The calcifications are noted without evidence for hemorrhage. The ventricles are proportionate to the degree of atrophy. Minimal white matter disease is present otherwise. The internal auditory canals are within normal limits. The brainstem and cerebellum are normal. Vascular: Flow is present in the major intracranial arteries. Skull and upper cervical spine: The skullbase is within normal limits. The craniocervical junction demonstrates soft tissue posterior to the dens likely associated with rheumatoid disease. A focal disc protrusion effaces the ventral CSF at C3-4. Marrow signal is within normal limits. Sinuses/Orbits: Chronic opacification of the right sphenoid sinus is again noted. The paranasal sinuses are otherwise clear. There is some fluid in the mastoid air  cells bilaterally. No obstructing nasopharyngeal lesion is present. IMPRESSION: 1. Acute nonhemorrhagic 7 mm white matter infarcts involving the posterior limb of the right internal capsule, just lateral to the area of chronic calcification. 2. Otherwise stable mild generalized atrophy without other significant white matter disease. 3. Chronic opacification of the right sphenoid sinus. 4. Bilateral mastoid effusions without obstructing nasopharyngeal lesion. 5. Degenerative changes within the upper cervical spine. Electronically Signed   By: San Morelle M.D.   On: 01/17/2017 16:20   Ct Head Code Stroke W/o Cm  Result Date: 01/17/2017 CLINICAL DATA:  Code stroke. Acute onset of left-sided deficits. Code stroke. Left-sided weakness beginning last night. EXAM: CT HEAD WITHOUT CONTRAST TECHNIQUE: Contiguous axial images were obtained from the base of the skull through the vertex without intravenous contrast. COMPARISON:  CT of the head 01/05/2017.  MRI brain 01/05/2017. FINDINGS: Brain: Bilateral basal ganglia calcifications are again noted, right greater than left. The basal ganglia are otherwise intact. No acute infarct, hemorrhage, or mass lesion is present. Mild white matter changes are stable. The ventricles are of normal size. No significant extra-axial fluid collection is present. The brainstem and cerebellum are within normal limits. Vascular: Atherosclerotic calcifications are present in the cavernous internal carotid arteries bilaterally without a hyperdense vessel. Skull: The calvarium is intact. No focal lytic or blastic lesions are present. Sinuses/Orbits: The paranasal sinuses and mastoid air cells are clear. Bilateral lens replacements are present. The globes and  orbits are within normal limits. ASPECTS [facility] Stroke Program Early CT Score) - Ganglionic level infarction (caudate, lentiform nuclei, internal capsule, insula, M1-M3 cortex): 7/7 - Supraganglionic infarction (M4-M6 cortex): 3/3  Total score (0-10 with 10 being normal): 10/10 IMPRESSION: 1. No acute intracranial abnormality. 2. Stable atrophy and mild white matter disease, likely within normal limits for age. 3. Atherosclerosis without a hyperdense vessel. 4. ASPECTS is 10/10 These results were called by telephone at the time of interpretation on 01/17/2017 at 11:28 am to Dr. Paschal Dopp , who verbally acknowledged these results. Electronically Signed   By: San Morelle M.D.   On: 01/17/2017 11:28        Scheduled Meds: . apixaban  5 mg Oral BID  . atorvastatin  20 mg Oral q1800  . insulin aspart  0-5 Units Subcutaneous QHS  . insulin aspart  0-9 Units Subcutaneous TID WC  . midazolam  1 mg Intravenous Once   Continuous Infusions:   LOS: 0 days    Olive Zmuda Tanna Furry, MD [facility] Pager [phone]  If 7PM-7AM, please contact night-coverage [email or web address] Password [email or web address] 01/18/2017, 3:02 PM

## 2017-01-18 NOTE — Progress Notes (Signed)
Patient slept well last pm.  Wakes easily to voice.  No neurological changes noted.  No complaints of pain or discomfort noted.  Cardiac rhythm remained unchanged.  No changes in assessment noted.  All questions and concerns addressed.  Will continue to monitor.

## 2017-01-19 DIAGNOSIS — I63411 Cerebral infarction due to embolism of right middle cerebral artery: Secondary | ICD-10-CM

## 2017-01-19 LAB — GLUCOSE, CAPILLARY
GLUCOSE-CAPILLARY: 96 mg/dL (ref 65–99)
Glucose-Capillary: 113 mg/dL — ABNORMAL HIGH (ref 65–99)

## 2017-01-19 LAB — HEMOGLOBIN A1C
HEMOGLOBIN A1C: 6 % — AB (ref 4.8–5.6)
Mean Plasma Glucose: 126 mg/dL

## 2017-01-19 MED ORDER — ATORVASTATIN CALCIUM 20 MG PO TABS: 20.0000 mg | ORAL_TABLET | Freq: Every day | ORAL | 0 refills | Status: DC

## 2017-01-19 NOTE — Progress Notes (Signed)
RN discussed discharge instructions with patient and family. Family and patient verbalizes understanding of eliquis, lipitor, asa, may begin antihypertensives tomorrow. Family was given script and d/c instructions. Aware she has to take lipitor and eliquis tonight. Neuro assessment unchanged. Will continue to monitor

## 2017-01-19 NOTE — Progress Notes (Signed)
Physical Therapy Treatment Patient Details Name: Cerita Rabelo MRN: [id number] DOB: 01-24-1926 Today's Date: 01/19/2017    History of Present Illness 83 y.o. F with PMHx significant for afib on Eliquis, woke up at 4am with L sided weakness and gait disturbance, pt went back to bed then was seen by her daughter at 730 am who noticed the left sided weakness and decided to call 911 and bring pt to ED, in ED pt is back to baseline, CT/CTA showed no acute changes; MRI shows acute nonhemorrhagic 7 mm white matter infarcts involving the posterior limb of the right internal capsule    PT Comments    Pt making good progress with mobility.  PT will continue to follow pt acutely to ensure a safe d/c home.   Follow Up Recommendations  Home health PT;Supervision/Assistance - 24 hour     Equipment Recommendations  None recommended by PT    Recommendations for Other Services       Precautions / Restrictions Precautions Precautions: Fall Restrictions Weight Bearing Restrictions: No    Mobility  Bed Mobility               General bed mobility comments: pt sitting OOB in recliner chair when therapist entered room  Transfers Overall transfer level: Needs assistance Equipment used: Rolling walker (2 wheeled) Transfers: Sit to/from Stand Sit to Stand: Supervision         General transfer comment: increased time, good technique, supervision for safety  Ambulation/Gait Ambulation/Gait assistance: Min guard Ambulation Distance (Feet): 200 Feet Assistive device: Rolling walker (2 wheeled) Gait Pattern/deviations: Step-through pattern;Decreased stride length;Trunk flexed Gait velocity: WFL Gait velocity interpretation: at or above normal speed for age/gender General Gait Details: pt with mild instability with ambulation but no overt LOB, close min guard for safety with RW and occasional min A to maintain a safe distance from [employer] Mobility     Modified Rankin (Stroke Patients Only) Modified Rankin (Stroke Patients Only) Pre-Morbid Rankin Score: Moderately severe disability Modified Rankin: Moderately severe disability     Balance Overall balance assessment: Needs assistance Sitting-balance support: No upper extremity supported;Feet supported Sitting balance-Leahy Scale: Fair     Standing balance support: During functional activity;Single extremity supported Standing balance-Leahy Scale: Poor Standing balance comment: pt reliant on at least one UE support on RW                            Cognition Arousal/Alertness: Awake/alert Behavior During Therapy: [facility] for tasks assessed/performed Overall Cognitive Status: Impaired/Different from baseline Area of Impairment: Memory;Safety/judgement;Following commands                     Memory: Decreased short-term memory Following Commands: Follows one step commands with increased time Safety/Judgement: Decreased awareness of deficits;Decreased awareness of safety            Exercises      General Comments        Pertinent Vitals/Pain Pain Assessment: No/denies pain    Home Living                      Prior Function            PT Goals (current goals can now be found in the care plan section) Acute Rehab PT Goals PT Goal Formulation: With patient Time For Goal Achievement: 02/01/17 Potential to  Achieve Goals: Good Progress towards PT goals: Progressing toward goals    Frequency    Min 4X/week      PT Plan Current plan remains appropriate    Co-evaluation              AM-PAC PT "6 Clicks" Daily Activity  Outcome Measure  Difficulty turning over in bed (including adjusting bedclothes, sheets and blankets)?: A Little Difficulty moving from lying on back to sitting on the side of the bed? : A Little Difficulty sitting down on and standing up from a chair with arms (e.g., wheelchair, bedside commode, etc,.)?: A  Little Help needed moving to and from a bed to chair (including a wheelchair)?: A Little Help needed walking in hospital room?: A Little Help needed climbing 3-5 steps with a railing? : A Lot 6 Click Score: 17    End of Session Equipment Utilized During Treatment: Gait belt Activity Tolerance: Patient tolerated treatment well Patient left: in chair;with call bell/phone within reach;with family/visitor present Nurse Communication: Mobility status PT Visit Diagnosis: Unsteadiness on feet (R26.81);Other abnormalities of gait and mobility (R26.89);Other symptoms and signs involving the nervous system (R29.898)     Time: 1147-1200 PT Time Calculation (min) (ACUTE ONLY): 13 min  Charges:  $Gait Training: 8-22 mins                    G Codes:       [place], [place], [place] 01/19/2017, 12:56 PM

## 2017-01-19 NOTE — Discharge Instructions (Signed)

## 2017-01-19 NOTE — Progress Notes (Signed)
[facility]  Patient Status: Active (receiving services up to time of hospitalization)  [facility] is providing the following services: RN, PT, OT and ST  If patient discharges after hours, please call [phone].   Christy Sutton 01/19/2017, 9:14 AM

## 2017-01-19 NOTE — Discharge Summary (Addendum)
Physician Discharge Summary  Christy Sutton OZD:[id number] DOB: [birth year] DOA: 01/17/2017  PCP: System, Pcp Not In  Admit date: 01/17/2017 Discharge date: 01/19/2017  Admitted From:home Disposition:home  Recommendations for Outpatient Follow-up:  1. Follow up with PCP in 1-2 weeks 2. Please obtain BMP/CBC in one week   Home Health:yes Equipment/Devices:walker Discharge Condition:stable CODE STATUS:dnr Diet recommendation:carb modified heart healthy diet  Brief/Interim Summary: 83 year old female with history of diabetes, hyperlipidemia, hypertension, TIAs, atrial fibrillation on chronic anticoagulation presented with right-sided weakness and numbness. Found to have acute ischemic stroke.  # Acute ischemic stroke involving posterior limb of right internal capsule: -CT angiogram of head and neck with no critical blood vessel stenosis. -Patient had echo done on 4/27 at [facility] showed EF of 55-60% and normal left ventricular systolic function.  -LDL 63, A1c 6 -Continue eliquis, ASA and lipitor on discharge -Home care services ordered on discharge. Evaluated by PT OT in the hospital. Patient's muscle strength and speech improved. Also evaluated by neurologist recommended outpatient follow-up.  #Paroxysmal atrial fibrillation: Monitor heart rate. On eliquis for systemic anticoagulation. Recommended outpatient follow-up with cardiologist. Continue atenolol   #Essential hypertension: Monitor blood pressure. Resume home medication on discharge.  #Type 2 diabetes:  A1c 6. Continue current insulin regimen. Monitor blood sugar level.  Patient is clinically improved. Evaluated for home today. Evaluated by neurologist recommended to discharge with current medication with outpatient follow-up. Home care services ordered. At this time patient is medically stable to discharge home with outpatient follow-up.  I discussed with the patient's son regarding follow up with pt's cardiologist in 2-3 weeks as  outpatient. He verbalized understanding.  Discharge Diagnoses:  Active Problems:   Stroke [facility])   TIA (transient ischemic attack)   Dysmetria   Essential hypertension   Diabetes mellitus with complication ([place])   Permanent atrial fibrillation ([place])   Ischemic stroke [facility])    Discharge Instructions  Discharge Instructions    Ambulatory referral to Neurology    Complete by:  As directed    Follow up with stroke clinic Cecille Rubin preferred, if not available, then consider Caesar Chestnut, [facility] or Jaynee Eagles whoever is available) at [facility] in about 6-8 weeks. Thanks.   Call MD for:  difficulty breathing, headache or visual disturbances    Complete by:  As directed    Call MD for:  extreme fatigue    Complete by:  As directed    Call MD for:  hives    Complete by:  As directed    Call MD for:  persistant dizziness or light-headedness    Complete by:  As directed    Call MD for:  persistant nausea and vomiting    Complete by:  As directed    Call MD for:  severe uncontrolled pain    Complete by:  As directed    Call MD for:  temperature >100.4    Complete by:  As directed    Diet - low sodium heart healthy    Complete by:  As directed    Diet Carb Modified    Complete by:  As directed    Increase activity slowly    Complete by:  As directed      Allergies as of 01/19/2017      Reactions   Penicillins Hives      Medication List    TAKE these medications   apixaban 5 MG Tabs tablet Commonly known as:  ELIQUIS Take 5 mg by mouth 2 (two) times daily.  aspirin 81 MG chewable tablet Chew 81 mg by mouth at bedtime.   atenolol 25 MG tablet Commonly known as:  TENORMIN Take 25 mg by mouth daily.   atorvastatin 20 MG tablet Commonly known as:  LIPITOR Take 1 tablet (20 mg total) by mouth daily at 6 PM.   lisinopril 5 MG tablet Commonly known as:  PRINIVIL,ZESTRIL Take 5 mg by mouth daily.   meclizine 25 MG tablet Commonly known as:  ANTIVERT Take 25 mg by mouth 2  (two) times daily as needed for dizziness.   metFORMIN 500 MG 24 hr tablet Commonly known as:  GLUCOPHAGE-XR TAKE 500 MG BY MOUTH ONCE DAILY WITH BREAKFAST      Follow-up Information    Dennie Bible, NP. Schedule an appointment as soon as possible for a visit in 6 week(s).   Specialty:  Family Medicine Contact information: [address] [place] [address] [phone]          Allergies  Allergen Reactions  . Penicillins Hives    Consultations: Neurologist  Procedures/Studies: MRI CT  Subjective: Patient was seen and examined at bedside. She is sitting on chair. She wants to go home. Denied headache, dizziness, nausea, vomiting, chest pain, shortness of breath.  Discharge Exam: Vitals:   01/19/17 0500 01/19/17 0924  BP: (!) 120/55 115/61  Pulse: (!) 59 67  Resp: 16 16  Temp: 98.2 F (36.8 C) 98.7 F (37.1 C)   Vitals:   01/18/17 2138 01/19/17 0123 01/19/17 0500 01/19/17 0924  BP: 120/60 (!) 115/58 (!) 120/55 115/61  Pulse: 62 65 (!) 59 67  Resp: 15 16 16 16   Temp: 98.2 F (36.8 C) 97.9 F (36.6 C) 98.2 F (36.8 C) 98.7 F (37.1 C)  TempSrc: Oral Oral Oral Oral  SpO2: 97% 97% 95% 96%  Weight: 67.6 kg (149 lb 0.5 oz)     Height: 5\' 6"  (1.676 m)       General: Pt is alert, awake, not in acute distress Cardiovascular: RRR, S1/S2 +, no rubs, no gallops Respiratory: CTA bilaterally, no wheezing, no rhonchi Abdominal: Soft, NT, ND, bowel sounds + Extremities: no edema, no cyanosis    The results of significant diagnostics from this hospitalization (including imaging, microbiology, ancillary and laboratory) are listed below for reference.     Microbiology: No results found for this or any previous visit (from the past 240 hour(s)).   Labs: BNP (last 3 results) No results for input(s): BNP in the last 8760 hours. Basic Metabolic Panel:  Recent Labs Lab 01/17/17 1058 01/17/17 1105  NA 139 141  K 3.8 3.9  CL 107 105   CO2 22  --   GLUCOSE 126* 124*  BUN 9 10  CREATININE 0.78 0.70  CALCIUM 9.1  --    Liver Function Tests:  Recent Labs Lab 01/17/17 1058  AST 25  ALT 13*  ALKPHOS 38  BILITOT 1.0  PROT 6.2*  ALBUMIN 3.9   No results for input(s): LIPASE, AMYLASE in the last 168 hours. No results for input(s): AMMONIA in the last 168 hours. CBC:  Recent Labs Lab 01/17/17 1058 01/17/17 1105  WBC 5.4  --   NEUTROABS 4.0  --   HGB 12.5 12.2  HCT 36.7 36.0  MCV 89.7  --   PLT 126*  --    Cardiac Enzymes: No results for input(s): CKTOTAL, CKMB, CKMBINDEX, TROPONINI in the last 168 hours. BNP: Invalid input(s): POCBNP CBG:  Recent Labs Lab 01/17/17 2104 01/18/17  1126 01/18/17 1554 01/18/17 2139 01/19/17 0619  GLUCAP 95 103* 144* 104* 96   D-Dimer No results for input(s): DDIMER in the last 72 hours. Hgb A1c  Recent Labs  01/18/17 0756  HGBA1C 6.0*   Lipid Profile  Recent Labs  01/18/17 0756  CHOL 146  HDL 68  LDLCALC 63  TRIG 75  CHOLHDL 2.1   Thyroid function studies No results for input(s): TSH, T4TOTAL, T3FREE, THYROIDAB in the last 72 hours.  Invalid input(s): FREET3 Anemia work up No results for input(s): VITAMINB12, FOLATE, FERRITIN, TIBC, IRON, RETICCTPCT in the last 72 hours. Urinalysis No results found for: COLORURINE, APPEARANCEUR, LABSPEC, [place], GLUCOSEU, HGBUR, BILIRUBINUR, KETONESUR, PROTEINUR, UROBILINOGEN, NITRITE, LEUKOCYTESUR Sepsis Labs Invalid input(s): PROCALCITONIN,  WBC,  LACTICIDVEN Microbiology No results found for this or any previous visit (from the past 240 hour(s)).   Time coordinating discharge: 26 minutes  SIGNED:   Rosita Fire, MD  [facility] 01/19/2017, 10:30 AM  If 7PM-7AM, please contact night-coverage [email or web address] Password [email or web address]

## 2017-01-19 NOTE — Care Management Note (Signed)
Case Management Note  Patient Details  Name: Christy Sutton MRN: [id number] Date of Birth: 08/07/1926  Subjective/Objective:                    Action/Plan: Pt discharging home with [facility] services. Patient was active with [facility] for [facility] RN/PT/OT and ST prior to admission. CM met with the patient and her family and they would like to continue with [facility]. Santiago Glad with [facility] notified of the resumption of care.  Family to provide supervision and transportation home.   Expected Discharge Date:  01/19/17               Expected Discharge Plan:  [place]  In-House Referral:     Discharge planning Services  CM Consult  Post Acute Care Choice:    Choice offered to:     DME Arranged:    DME Agency:     HH Arranged:    [place] Agency:     Status of Service:  Completed, signed off  If discussed at [employer], dates discussed:    Additional Comments:  Pollie Friar, RN 01/19/2017, 11:19 AM

## 2017-01-22 DIAGNOSIS — Z0189 Encounter for other specified special examinations: Secondary | ICD-10-CM | POA: Diagnosis not present

## 2017-01-22 DIAGNOSIS — I4891 Unspecified atrial fibrillation: Secondary | ICD-10-CM | POA: Diagnosis not present

## 2017-01-22 DIAGNOSIS — Z8673 Personal history of transient ischemic attack (TIA), and cerebral infarction without residual deficits: Secondary | ICD-10-CM | POA: Diagnosis not present

## 2017-01-22 DIAGNOSIS — E119 Type 2 diabetes mellitus without complications: Secondary | ICD-10-CM | POA: Diagnosis not present

## 2017-01-24 DIAGNOSIS — H9190 Unspecified hearing loss, unspecified ear: Secondary | ICD-10-CM | POA: Diagnosis not present

## 2017-01-24 DIAGNOSIS — I4891 Unspecified atrial fibrillation: Secondary | ICD-10-CM | POA: Diagnosis not present

## 2017-01-24 DIAGNOSIS — I10 Essential (primary) hypertension: Secondary | ICD-10-CM | POA: Diagnosis not present

## 2017-01-24 DIAGNOSIS — I69354 Hemiplegia and hemiparesis following cerebral infarction affecting left non-dominant side: Secondary | ICD-10-CM | POA: Diagnosis not present

## 2017-01-24 DIAGNOSIS — E119 Type 2 diabetes mellitus without complications: Secondary | ICD-10-CM | POA: Diagnosis not present

## 2017-01-24 DIAGNOSIS — Z7984 Long term (current) use of oral hypoglycemic drugs: Secondary | ICD-10-CM | POA: Diagnosis not present

## 2017-01-27 DIAGNOSIS — I4891 Unspecified atrial fibrillation: Secondary | ICD-10-CM | POA: Diagnosis not present

## 2017-01-27 DIAGNOSIS — Z7984 Long term (current) use of oral hypoglycemic drugs: Secondary | ICD-10-CM | POA: Diagnosis not present

## 2017-01-27 DIAGNOSIS — H9190 Unspecified hearing loss, unspecified ear: Secondary | ICD-10-CM | POA: Diagnosis not present

## 2017-01-27 DIAGNOSIS — I69354 Hemiplegia and hemiparesis following cerebral infarction affecting left non-dominant side: Secondary | ICD-10-CM | POA: Diagnosis not present

## 2017-01-27 DIAGNOSIS — I10 Essential (primary) hypertension: Secondary | ICD-10-CM | POA: Diagnosis not present

## 2017-01-27 DIAGNOSIS — E119 Type 2 diabetes mellitus without complications: Secondary | ICD-10-CM | POA: Diagnosis not present

## 2017-01-28 NOTE — H&P (Signed)
OFFICE VISIT NOTES COPIED TO EPIC FOR DOCUMENTATION  . History of Present Illness Gwinda Maine FNP-C; Feb 02, 2017 5:20 PM) Patient words: NP EVAL for afib.  The patient is a 83 year old female who presents for a Follow-up for Atrial fibrillation. Patient presents for evaluation of atrial fibrillation. She was presently admitted to [facility] with left sided weakness and found to have acute ischemic CVA. She has chronic atrial fibrillation. Patient was admitted on 01/17/2017 with acute ischemic CVA involving the posterior limb of the right internal capsule. She did not recieve TPA as she had taking Eliquis that day. She did not have any residual defecits. She had previously had CVA the week before and was admitted to [facility] at that time. During that admission at [facility] she was found to be in new onset atrial fibrillation and this was started on Eliquis and ASA for anticoagulation. She underwent echocardiogram on 01/05/2017 was essentially normal. She is remained rate controlled on atenolol. It was felt recurrence of CVA likely related to new onset of atrial fibrillation and and advised to follow-up with cardiology outpatient. She now presents for hospital follow-up.  Patient's son called her office this morning stating that she continues to have symptoms of left-sided weakness on and off doing range from 15-30 minutes. He asked the patient could be seen sooner than previously given appointment. We have brought her in for evaluation given her symptoms. Her children are at bedside and state that she continues to have left-sided weakness and looses complete function of her left side. Episodes occur at random and resolve on their own. She does not lose ability to speak. She is to schedule follow-up with neurology; however, has not done so at this time.  Denies chest pain, PND, orthopnea, palpitations, edema, syncopal episodes, or symptoms of claudication.   Problem  List/Past Medical Franky Macho Reader; 02/02/2017 3:14 PM) Paroxysmal A-fib (I48.0)  Controlled type 2 diabetes mellitus without complication, without long-term current use of insulin (E11.9)  Benign essential hypertension (I10)  Hyperlipidemia, mixed (E78.2)  Hypothyroidism (E03.9)   Allergies (Charavina Reader; 2017-02-02 3:15 PM) Penicillins  Hives.  Family History Franky Macho Reader; 2017-02-02 3:18 PM) Mother  Deceased. at age 4 from unknown causes; no heart attacks or strokes, no known cardiovascular conditions Sister 1  In stable health. 1 1/2 yr older; 1-2 strokes, mini strokes, no heart attacks, no other known cardiovascular conditions  Social History Franky Macho Reader; 02/02/17 3:21 PM) Marital status  Widowed. Number of Children  2. Living Situation  Lives with caregiver. grandson lives with her Non Drinker/No Alcohol Use  Current tobacco use  Former smoker.  Past Surgical History Franky Macho Reader; 2017/02/02 3:24 PM) Appendectomy [1959]: Salpingectomy; Bilateral [1961]:  Medication History (Charavina Reader; Feb 02, 2017 3:29 PM) Simvastatin (40MG  Tablet, 1 Oral daily) Active. MetFORMIN HCl ER (500MG  Tablet ER 24HR, 1 Oral daily) Active. Atorvastatin Calcium (20MG  Tablet, 1 Oral daily) Active. Atenolol (25MG  Tablet, 1 Oral daily) Active. Aspirin (81MG  Tablet DR, 1 Oral daily) Active. Eliquis (5MG  Tablet, 1 Oral daily) Active. Lisinopril (5MG  Tablet, 1 Oral daily) Active. Meclizine HCl (25MG  Tablet, 1 Oral as needed) Active. Medications Reconciled (verbally with pt; medication present)  Diagnostic Studies History Franky Macho Reader; 2017/02/02 3:24 PM) None [2017-02-02]:    Review of Systems Gwinda Maine FNP-C; 02-Feb-2017 5:40 PM) General Not Present- Appetite Loss and Weight Gain. Respiratory Not Present- Chronic Cough and Wakes up from Sleep Wheezing or Short of Breath. Cardiovascular Not Present- Chest Pain, Difficulty Breathing Lying  Down, Difficulty Breathing On Exertion, Edema and Palpitations. Gastrointestinal Not Present- Black, Tarry Stool and Difficulty Swallowing. Musculoskeletal Not Present- Decreased Range of Motion and Muscle Atrophy. Neurological Present- Numbness (episodes on left side) and Weakness In Extremities (episodes of left sided weakness and paralysis). Not Present- Attention Deficit and Difficulty Speaking. Psychiatric Not Present- Personality Changes and Suicidal Ideation. Endocrine Not Present- Cold Intolerance and Heat Intolerance. Hematology Not Present- Abnormal Bleeding. All other systems negative  Vitals Franky Macho Reader; 01/22/2017 3:35 PM) 01/22/2017 3:09 PM Weight: 135.19 lb Height: 63.5in Body Surface Area: 1.65 m Body Mass Index: 23.57 kg/m  Pulse: 64 (Regular)  P.OX: 97% (Room air) BP: 112/64 (Sitting, Left Arm, Standard)       Physical Exam Gwinda Maine FNP-C; 01/22/2017 4:09 PM) General Mental Status-Alert. General Appearance-Cooperative and Appears stated age. Build & Nutrition-Moderately built.  Head and Neck Thyroid Gland Characteristics - normal size and consistency and no palpable nodules.  Chest and Lung Exam Chest and lung exam reveals -quiet, even and easy respiratory effort with no use of accessory muscles, non-tender and on auscultation, normal breath sounds, no adventitious sounds.  Cardiovascular Cardiovascular examination reveals -normal heart sounds, regular rate and rhythm with no murmurs, carotid auscultation reveals no bruits, abdominal aorta auscultation reveals no bruits and no prominent pulsation, femoral artery auscultation bilaterally reveals normal pulses, no bruits, no thrills, normal pedal pulses bilaterally and no digital clubbing, cyanosis, edema, increased warmth or tenderness.  Abdomen Palpation/Percussion Palpation and Percussion of the abdomen reveal - Non Tender and No hepatosplenomegaly.  Neurologic Neurologic  evaluation reveals -alert and oriented x 3 with no impairment of recent or remote memory. Motor-Grossly intact without any focal deficits.  Musculoskeletal Global Assessment Left Lower Extremity - no deformities, masses or tenderness, no known fractures. Right Lower Extremity - no deformities, masses or tenderness, no known fractures.    Assessment & Plan Gwinda Maine FNP-C; 01/22/2017 5:38 PM) New onset atrial fibrillation (I48.91) Impression: EKG 01/22/2017: Atrial fibrillation rate controlled at 76 bpm, normal axis, diffuse nonspecific T-wave inversion, cannot exclude ischemia. Current Plans Complete electrocardiogram (93000) History of CVA (cerebrovascular accident) without residual deficits (Z86.73) Story: MRI 01/15/2017: Acute nonhemorrhagic 7 mm white matter infarcts involving the posterior limb of the right internal capsule, just lateral to the area of chronic calcification. Laboratory examination (Z01.89) Impression: 11/16/2016: Platelet count 147, CBC otherwise normal. Creatinine 0.83, potassium 4.7, CMP otherwise normal. Cholesterol 184, triglycerides 104, HDL 83, LDL 80. Controlled type 2 diabetes mellitus without complication, without long-term current use of insulin (E11.9) Benign essential hypertension (I10) Hyperlipidemia, mixed (E78.2) Current Plans Mechanism of underlying disease process and action of medications discussed with the patient. I discussed primary/secondary prevention and also dietary counceling was done. Patient presents for hospital follow-up for recent events of acute ischemic CVA likely related to new onset atrial fibrillation. She was recently started on Eliquis when admitted to [facility]. She then had recurrent CVA the following week. Echocardiogram was essentially normal. Her children are present at bedside and continued reports symptoms that they feel may be recurrent TIAs. Unsure of etiology of episodes of left-sided weakness and  have advised them to start recording episodes for further assessment. Advise her to keep follow-up appointment with neurology. I discussed with patient and her children recurrent CVA could be related to residual thrombus still present as she just started Eliquis, I have advised as she's been on Eliquis for a few weeks now and symptoms should continue to improve. We'll further evaluate for  residual thrombus or for other possible causes of continued symptoms such as aortic atheroma with TEE. Schedule for TEE to better evaluate the structural abnormality. I have explained risks, benefits,alternatives to TEE, including but not limited to rare esophageal perforation, bleeding, aspiration pneumonia. Patient and children verbalize understanding and wish to proceed. She remains rate controlled with atenolol. I have advised her to continue with Eliquis and ASA for now. She is found to be both on atorvastatin and simvastatin. Will discontinue atorvastatin and continue with simvastatin. I will have her follow up after testing for further recommendations and re-evaluation.  *I have discussed this case with Dr. Einar Gip and he personally examined the patient and participated in formulating the plan.*  CC: Dr. Leanna Battles  Discontinued Atorvastatin Calcium 20MG . Signed electronically by Gwinda Maine, FNP-C (01/22/2017 5:41 PM)

## 2017-01-30 ENCOUNTER — Ambulatory Visit (HOSPITAL_COMMUNITY)
Admission: RE | Admit: 2017-01-30 | Discharge: 2017-01-30 | Disposition: A | Discharge status: Home / Self Care | Payer: Medicare Other | Source: Ambulatory Visit | Attending: Cardiology | Admitting: Cardiology

## 2017-01-30 ENCOUNTER — Encounter (HOSPITAL_COMMUNITY): Payer: Self-pay | Admitting: *Deleted

## 2017-01-30 ENCOUNTER — Encounter (HOSPITAL_COMMUNITY)
Admission: RE | Disposition: A | Discharge status: Home / Self Care | Payer: Self-pay | Source: Ambulatory Visit | Attending: Cardiology

## 2017-01-30 DIAGNOSIS — Z7984 Long term (current) use of oral hypoglycemic drugs: Secondary | ICD-10-CM | POA: Diagnosis not present

## 2017-01-30 DIAGNOSIS — E039 Hypothyroidism, unspecified: Secondary | ICD-10-CM | POA: Insufficient documentation

## 2017-01-30 DIAGNOSIS — I4891 Unspecified atrial fibrillation: Secondary | ICD-10-CM | POA: Diagnosis not present

## 2017-01-30 DIAGNOSIS — Z8673 Personal history of transient ischemic attack (TIA), and cerebral infarction without residual deficits: Secondary | ICD-10-CM | POA: Insufficient documentation

## 2017-01-30 DIAGNOSIS — E119 Type 2 diabetes mellitus without complications: Secondary | ICD-10-CM | POA: Insufficient documentation

## 2017-01-30 DIAGNOSIS — Z7902 Long term (current) use of antithrombotics/antiplatelets: Secondary | ICD-10-CM | POA: Insufficient documentation

## 2017-01-30 DIAGNOSIS — I69354 Hemiplegia and hemiparesis following cerebral infarction affecting left non-dominant side: Secondary | ICD-10-CM | POA: Diagnosis not present

## 2017-01-30 DIAGNOSIS — Z87891 Personal history of nicotine dependence: Secondary | ICD-10-CM | POA: Diagnosis not present

## 2017-01-30 DIAGNOSIS — Z880 Allergy status to penicillin: Secondary | ICD-10-CM | POA: Diagnosis not present

## 2017-01-30 DIAGNOSIS — I480 Paroxysmal atrial fibrillation: Secondary | ICD-10-CM | POA: Diagnosis not present

## 2017-01-30 DIAGNOSIS — Z7982 Long term (current) use of aspirin: Secondary | ICD-10-CM | POA: Insufficient documentation

## 2017-01-30 DIAGNOSIS — E782 Mixed hyperlipidemia: Secondary | ICD-10-CM | POA: Insufficient documentation

## 2017-01-30 DIAGNOSIS — I10 Essential (primary) hypertension: Secondary | ICD-10-CM | POA: Diagnosis not present

## 2017-01-30 DIAGNOSIS — Z79899 Other long term (current) drug therapy: Secondary | ICD-10-CM | POA: Diagnosis not present

## 2017-01-30 DIAGNOSIS — I482 Chronic atrial fibrillation: Secondary | ICD-10-CM | POA: Diagnosis present

## 2017-01-30 DIAGNOSIS — H9190 Unspecified hearing loss, unspecified ear: Secondary | ICD-10-CM | POA: Diagnosis not present

## 2017-01-30 DIAGNOSIS — I639 Cerebral infarction, unspecified: Secondary | ICD-10-CM | POA: Diagnosis present

## 2017-01-30 HISTORY — PX: TEE WITHOUT CARDIOVERSION: SHX5443

## 2017-01-30 SURGERY — ECHOCARDIOGRAM, TRANSESOPHAGEAL
Anesthesia: Moderate Sedation

## 2017-01-30 MED ORDER — MIDAZOLAM HCL 5 MG/ML IJ SOLN
INTRAMUSCULAR | Status: AC
  Filled 2017-01-30: qty 2

## 2017-01-30 MED ORDER — SODIUM CHLORIDE 0.9 % IV SOLN
INTRAVENOUS | Status: DC
  Administered 2017-01-30: 10:00:00 via INTRAVENOUS

## 2017-01-30 MED ORDER — FENTANYL CITRATE (PF) 100 MCG/2ML IJ SOLN
INTRAMUSCULAR | Status: DC | PRN
  Administered 2017-01-30: 25 ug via INTRAVENOUS

## 2017-01-30 MED ORDER — FENTANYL CITRATE (PF) 100 MCG/2ML IJ SOLN
INTRAMUSCULAR | Status: AC
  Filled 2017-01-30: qty 2

## 2017-01-30 MED ORDER — MIDAZOLAM HCL 10 MG/2ML IJ SOLN
INTRAMUSCULAR | Status: DC | PRN
  Administered 2017-01-30: 2 mg via INTRAVENOUS
  Administered 2017-01-30: 1 mg via INTRAVENOUS

## 2017-01-30 NOTE — CV Procedure (Signed)
TEE: Under moderate sedation, TEE was performed without complications: LV: Normal. Normal EF. RV: Normal LA: Moderately enlarged. Left atrial appendage: Reduced function. Large. Moderate spontaneous smoke present. Cannot exclude presence of thrombus in the LAA apex.  Inter atrial septum is intact without defect. Double contrast study negative for atrial level shunting. No late appearance of bubbles either. RA: Normal  MV: Normal Trace MR. TV: Normal Trace TR AV: Normal.  Trace AI  PV: Normal. Trace PI.  Thoracic and ascending aorta: Mild atheromatous changes noted.   Conscious sedation protocol was followed, I personally administered conscious sedation and monitored the patient. Patient received 3 milligrams of Versed and fentanyl 25 mcg . Patient tolerated the procedure well and there was no complication from conscious sedation. Time administered was 7 minutes.

## 2017-01-30 NOTE — Progress Notes (Signed)
  Echocardiogram Echocardiogram Transesophageal has been performed.  Tresa Res 01/30/2017, 4:24 PM

## 2017-01-30 NOTE — Interval H&P Note (Signed)
History and Physical Interval Note:  01/30/2017 11:28 AM  Christy Sutton  has presented today for surgery, with the diagnosis of AFIB, HISTORY OF CDA   The various methods of treatment have been discussed with the patient and family. After consideration of risks, benefits and other options for treatment, the patient has consented to  Procedure(s): TRANSESOPHAGEAL ECHOCARDIOGRAM (TEE) (N/A) as a surgical intervention .  The patient's history has been reviewed, patient examined, no change in status, stable for surgery.  I have reviewed the patient's chart and labs.  Questions were answered to the patient's satisfaction.     Adrian Prows

## 2017-01-30 NOTE — Discharge Instructions (Signed)

## 2017-02-04 DIAGNOSIS — I10 Essential (primary) hypertension: Secondary | ICD-10-CM | POA: Diagnosis not present

## 2017-02-04 DIAGNOSIS — G8190 Hemiplegia, unspecified affecting unspecified side: Secondary | ICD-10-CM | POA: Diagnosis not present

## 2017-02-04 DIAGNOSIS — E1151 Type 2 diabetes mellitus with diabetic peripheral angiopathy without gangrene: Secondary | ICD-10-CM | POA: Diagnosis not present

## 2017-02-04 DIAGNOSIS — E784 Other hyperlipidemia: Secondary | ICD-10-CM | POA: Diagnosis not present

## 2017-02-04 DIAGNOSIS — Z6825 Body mass index (BMI) 25.0-25.9, adult: Secondary | ICD-10-CM | POA: Diagnosis not present

## 2017-02-04 DIAGNOSIS — H9193 Unspecified hearing loss, bilateral: Secondary | ICD-10-CM | POA: Diagnosis not present

## 2017-02-04 DIAGNOSIS — E038 Other specified hypothyroidism: Secondary | ICD-10-CM | POA: Diagnosis not present

## 2017-02-04 DIAGNOSIS — I480 Paroxysmal atrial fibrillation: Secondary | ICD-10-CM | POA: Diagnosis not present

## 2017-02-06 DIAGNOSIS — I4891 Unspecified atrial fibrillation: Secondary | ICD-10-CM | POA: Diagnosis not present

## 2017-02-06 DIAGNOSIS — Z0189 Encounter for other specified special examinations: Secondary | ICD-10-CM | POA: Diagnosis not present

## 2017-02-06 DIAGNOSIS — Z8673 Personal history of transient ischemic attack (TIA), and cerebral infarction without residual deficits: Secondary | ICD-10-CM | POA: Diagnosis not present

## 2017-02-06 DIAGNOSIS — E119 Type 2 diabetes mellitus without complications: Secondary | ICD-10-CM | POA: Diagnosis not present

## 2017-03-07 ENCOUNTER — Encounter (HOSPITAL_COMMUNITY): Payer: Self-pay | Admitting: Emergency Medicine

## 2017-03-07 ENCOUNTER — Emergency Department (HOSPITAL_COMMUNITY): Payer: Medicare Other

## 2017-03-07 ENCOUNTER — Inpatient Hospital Stay (HOSPITAL_COMMUNITY)
Admission: EM | Admit: 2017-03-07 | Discharge: 2017-03-09 | DRG: 065 | Disposition: A | Discharge status: Rehabilitation Facility | Payer: Medicare Other | Attending: Internal Medicine | Admitting: Internal Medicine

## 2017-03-07 ENCOUNTER — Inpatient Hospital Stay (HOSPITAL_COMMUNITY): Payer: Medicare Other

## 2017-03-07 DIAGNOSIS — I63119 Cerebral infarction due to embolism of unspecified vertebral artery: Secondary | ICD-10-CM | POA: Diagnosis not present

## 2017-03-07 DIAGNOSIS — G8194 Hemiplegia, unspecified affecting left nondominant side: Secondary | ICD-10-CM | POA: Diagnosis not present

## 2017-03-07 DIAGNOSIS — R270 Ataxia, unspecified: Secondary | ICD-10-CM | POA: Diagnosis present

## 2017-03-07 DIAGNOSIS — I63411 Cerebral infarction due to embolism of right middle cerebral artery: Secondary | ICD-10-CM | POA: Diagnosis not present

## 2017-03-07 DIAGNOSIS — H9190 Unspecified hearing loss, unspecified ear: Secondary | ICD-10-CM | POA: Diagnosis present

## 2017-03-07 DIAGNOSIS — I482 Chronic atrial fibrillation: Secondary | ICD-10-CM | POA: Diagnosis present

## 2017-03-07 DIAGNOSIS — D62 Acute posthemorrhagic anemia: Secondary | ICD-10-CM

## 2017-03-07 DIAGNOSIS — E785 Hyperlipidemia, unspecified: Secondary | ICD-10-CM | POA: Diagnosis present

## 2017-03-07 DIAGNOSIS — R29704 NIHSS score 4: Secondary | ICD-10-CM | POA: Diagnosis present

## 2017-03-07 DIAGNOSIS — Z8673 Personal history of transient ischemic attack (TIA), and cerebral infarction without residual deficits: Secondary | ICD-10-CM | POA: Diagnosis not present

## 2017-03-07 DIAGNOSIS — Z7984 Long term (current) use of oral hypoglycemic drugs: Secondary | ICD-10-CM | POA: Diagnosis not present

## 2017-03-07 DIAGNOSIS — R531 Weakness: Secondary | ICD-10-CM | POA: Diagnosis not present

## 2017-03-07 DIAGNOSIS — E876 Hypokalemia: Secondary | ICD-10-CM | POA: Diagnosis not present

## 2017-03-07 DIAGNOSIS — Z90710 Acquired absence of both cervix and uterus: Secondary | ICD-10-CM | POA: Diagnosis not present

## 2017-03-07 DIAGNOSIS — Z880 Allergy status to penicillin: Secondary | ICD-10-CM | POA: Diagnosis not present

## 2017-03-07 DIAGNOSIS — R471 Dysarthria and anarthria: Secondary | ICD-10-CM | POA: Diagnosis present

## 2017-03-07 DIAGNOSIS — Z79899 Other long term (current) drug therapy: Secondary | ICD-10-CM

## 2017-03-07 DIAGNOSIS — I6340 Cerebral infarction due to embolism of unspecified cerebral artery: Principal | ICD-10-CM | POA: Diagnosis present

## 2017-03-07 DIAGNOSIS — Z7901 Long term (current) use of anticoagulants: Secondary | ICD-10-CM | POA: Diagnosis not present

## 2017-03-07 DIAGNOSIS — R29810 Facial weakness: Secondary | ICD-10-CM | POA: Diagnosis present

## 2017-03-07 DIAGNOSIS — I639 Cerebral infarction, unspecified: Secondary | ICD-10-CM | POA: Diagnosis not present

## 2017-03-07 DIAGNOSIS — E118 Type 2 diabetes mellitus with unspecified complications: Secondary | ICD-10-CM | POA: Diagnosis present

## 2017-03-07 DIAGNOSIS — I6310 Cerebral infarction due to embolism of unspecified precerebral artery: Secondary | ICD-10-CM | POA: Diagnosis not present

## 2017-03-07 DIAGNOSIS — E119 Type 2 diabetes mellitus without complications: Secondary | ICD-10-CM | POA: Diagnosis not present

## 2017-03-07 DIAGNOSIS — E46 Unspecified protein-calorie malnutrition: Secondary | ICD-10-CM | POA: Diagnosis not present

## 2017-03-07 DIAGNOSIS — I6349 Cerebral infarction due to embolism of other cerebral artery: Secondary | ICD-10-CM | POA: Diagnosis not present

## 2017-03-07 DIAGNOSIS — Z66 Do not resuscitate: Secondary | ICD-10-CM | POA: Diagnosis present

## 2017-03-07 DIAGNOSIS — I10 Essential (primary) hypertension: Secondary | ICD-10-CM | POA: Diagnosis present

## 2017-03-07 DIAGNOSIS — Z8249 Family history of ischemic heart disease and other diseases of the circulatory system: Secondary | ICD-10-CM

## 2017-03-07 DIAGNOSIS — M6281 Muscle weakness (generalized): Secondary | ICD-10-CM | POA: Diagnosis not present

## 2017-03-07 DIAGNOSIS — Z7982 Long term (current) use of aspirin: Secondary | ICD-10-CM

## 2017-03-07 DIAGNOSIS — D649 Anemia, unspecified: Secondary | ICD-10-CM | POA: Diagnosis not present

## 2017-03-07 DIAGNOSIS — I69354 Hemiplegia and hemiparesis following cerebral infarction affecting left non-dominant side: Secondary | ICD-10-CM | POA: Diagnosis not present

## 2017-03-07 DIAGNOSIS — R29818 Other symptoms and signs involving the nervous system: Secondary | ICD-10-CM | POA: Diagnosis not present

## 2017-03-07 DIAGNOSIS — I360 Nonrheumatic tricuspid (valve) stenosis: Secondary | ICD-10-CM | POA: Diagnosis not present

## 2017-03-07 DIAGNOSIS — R404 Transient alteration of awareness: Secondary | ICD-10-CM | POA: Diagnosis not present

## 2017-03-07 LAB — DIFFERENTIAL
BASOS ABS: 0 10*3/uL (ref 0.0–0.1)
Basophils Relative: 0 %
EOS ABS: 0.4 10*3/uL (ref 0.0–0.7)
Eosinophils Relative: 6 %
Lymphocytes Relative: 11 %
Lymphs Abs: 0.8 10*3/uL (ref 0.7–4.0)
MONO ABS: 0.6 10*3/uL (ref 0.1–1.0)
MONOS PCT: 8 %
NEUTROS ABS: 5.1 10*3/uL (ref 1.7–7.7)
Neutrophils Relative %: 75 %

## 2017-03-07 LAB — COMPREHENSIVE METABOLIC PANEL
ALBUMIN: 3.3 g/dL — AB (ref 3.5–5.0)
ALK PHOS: 41 U/L (ref 38–126)
ALT: 14 U/L (ref 14–54)
ANION GAP: 5 (ref 5–15)
AST: 26 U/L (ref 15–41)
BILIRUBIN TOTAL: 0.4 mg/dL (ref 0.3–1.2)
BUN: 7 mg/dL (ref 6–20)
CALCIUM: 9.1 mg/dL (ref 8.9–10.3)
CO2: 26 mmol/L (ref 22–32)
CREATININE: 0.74 mg/dL (ref 0.44–1.00)
Chloride: 109 mmol/L (ref 101–111)
GFR calc Af Amer: >60 mL/min (ref >60)
GFR calc non Af Amer: >60 mL/min (ref >60)
GLUCOSE: 133 mg/dL — AB (ref 65–99)
Potassium: 3.7 mmol/L (ref 3.5–5.1)
Sodium: 140 mmol/L (ref 135–145)
TOTAL PROTEIN: 5.8 g/dL — AB (ref 6.5–8.1)

## 2017-03-07 LAB — CBG MONITORING, ED: GLUCOSE-CAPILLARY: 154 mg/dL — AB (ref 65–99)

## 2017-03-07 LAB — APTT: APTT: 39 s — AB (ref 24–36)

## 2017-03-07 LAB — I-STAT CHEM 8, ED
BUN: 9 mg/dL (ref 6–20)
CALCIUM ION: 1.14 mmol/L — AB (ref 1.15–1.40)
CHLORIDE: 107 mmol/L (ref 101–111)
CREATININE: 0.7 mg/dL (ref 0.44–1.00)
GLUCOSE: 130 mg/dL — AB (ref 65–99)
HCT: 30 % — ABNORMAL LOW (ref 36.0–46.0)
Hemoglobin: 10.2 g/dL — ABNORMAL LOW (ref 12.0–15.0)
Potassium: 3.7 mmol/L (ref 3.5–5.1)
Sodium: 142 mmol/L (ref 135–145)
TCO2: 26 mmol/L (ref 0–100)

## 2017-03-07 LAB — I-STAT TROPONIN, ED: Troponin i, poc: 0 ng/mL (ref 0.00–0.08)

## 2017-03-07 LAB — CBC
HEMATOCRIT: 33.1 % — AB (ref 36.0–46.0)
Hemoglobin: 11.2 g/dL — ABNORMAL LOW (ref 12.0–15.0)
MCH: 30.3 pg (ref 26.0–34.0)
MCHC: 33.8 g/dL (ref 30.0–36.0)
MCV: 89.5 fL (ref 78.0–100.0)
PLATELETS: 157 10*3/uL (ref 150–400)
RBC: 3.7 MIL/uL — ABNORMAL LOW (ref 3.87–5.11)
RDW: 13 % (ref 11.5–15.5)
WBC: 6.9 10*3/uL (ref 4.0–10.5)

## 2017-03-07 LAB — PROTIME-INR
INR: 1.31
PROTHROMBIN TIME: 16.4 s — AB (ref 11.4–15.2)

## 2017-03-07 LAB — GLUCOSE, CAPILLARY: GLUCOSE-CAPILLARY: 129 mg/dL — AB (ref 65–99)

## 2017-03-07 MED ORDER — MECLIZINE HCL 12.5 MG PO TABS: 25.0000 mg | ORAL_TABLET | Freq: Two times a day (BID) | ORAL | Status: DC | PRN

## 2017-03-07 MED ORDER — HYDRALAZINE HCL 20 MG/ML IJ SOLN: 5.0000 mg | Freq: Four times a day (QID) | INTRAMUSCULAR | Status: DC | PRN

## 2017-03-07 MED ORDER — LORAZEPAM 2 MG/ML IJ SOLN
0.5000 mg | Freq: Once | INTRAMUSCULAR | Status: AC
  Administered 2017-03-07: 0.5 mg via INTRAVENOUS

## 2017-03-07 MED ORDER — SODIUM CHLORIDE 0.9 % IV SOLN
INTRAVENOUS | Status: DC
  Administered 2017-03-07: 10 mL/h via INTRAVENOUS
  Administered 2017-03-07: 21:00:00 via INTRAVENOUS

## 2017-03-07 MED ORDER — ACETAMINOPHEN 325 MG PO TABS: 650.0000 mg | ORAL_TABLET | ORAL | Status: DC | PRN

## 2017-03-07 MED ORDER — STROKE: EARLY STAGES OF RECOVERY BOOK
Freq: Once | Status: AC
  Administered 2017-03-07: 21:00:00
  Filled 2017-03-07: qty 1

## 2017-03-07 MED ORDER — INSULIN ASPART 100 UNIT/ML ~~LOC~~ SOLN
0.0000 [IU] | Freq: Three times a day (TID) | SUBCUTANEOUS | Status: DC
  Administered 2017-03-09: 1 [IU] via SUBCUTANEOUS

## 2017-03-07 MED ORDER — ATORVASTATIN CALCIUM 20 MG PO TABS
20.0000 mg | ORAL_TABLET | Freq: Every day | ORAL | Status: DC
  Administered 2017-03-08: 20 mg via ORAL
  Filled 2017-03-07: qty 1
  Filled 2017-03-07 (×2): qty 2
  Filled 2017-03-07 (×2): qty 1

## 2017-03-07 MED ORDER — ASPIRIN 81 MG PO CHEW
81.0000 mg | CHEWABLE_TABLET | Freq: Every day | ORAL | Status: DC
  Administered 2017-03-07 – 2017-03-08 (×2): 81 mg via ORAL
  Filled 2017-03-07 (×2): qty 1

## 2017-03-07 MED ORDER — ACETAMINOPHEN 650 MG RE SUPP: 650.0000 mg | RECTAL | Status: DC | PRN

## 2017-03-07 MED ORDER — ACETAMINOPHEN 160 MG/5ML PO SOLN
650.0000 mg | ORAL | Status: DC | PRN
Start: 2017-03-07 — End: 2017-03-09

## 2017-03-07 MED ORDER — LORAZEPAM 2 MG/ML IJ SOLN
INTRAMUSCULAR | Status: AC
  Filled 2017-03-07: qty 1

## 2017-03-07 MED ORDER — SENNOSIDES-DOCUSATE SODIUM 8.6-50 MG PO TABS: 1.0000 | ORAL_TABLET | Freq: Every evening | ORAL | Status: DC | PRN

## 2017-03-07 NOTE — Progress Notes (Signed)
Pt admitted from ED with stroke symptoms, alert and oriented , denies any pain, pt settled in bed, call light and family at bedside, tele monitor put and verified on pt, pt reassured, will however continue to monitor. Obasogie-Asidi, Licet Dunphy Efe

## 2017-03-07 NOTE — ED Notes (Signed)
Provider at bedside

## 2017-03-07 NOTE — ED Triage Notes (Signed)
Arrived via EMS patient lives alone woke up and walking around and at 0900 developed left upper and lower extremity weakness and abnormal gait. Called family member at 50 who called EMS. Arrived at hospital alert answering and following commands appropriate with left upper and lower ataxia and history of hard of  Hearing.

## 2017-03-07 NOTE — ED Notes (Signed)
Pt returned from MRI to [facility] in [place]. Will have transported to floor. Denies complaints

## 2017-03-07 NOTE — ED Notes (Signed)
Family requested medication to help with MRI. Hold Ativan for MRI.

## 2017-03-07 NOTE — ED Notes (Signed)
Checked patient blood sugar it was 154 notified RN Greg of blood sugar patient is resting with family at beside

## 2017-03-07 NOTE — Consult Note (Addendum)
Reason for Consult:Code Stroke Referring Physician: Dr Carolynne Edouard HPI:  Christy Sutton is an 83 y.o. female. Cool woke up this morning at her baseline. She has 2 prior history of strokes in April as well as May 2018 from atrial fibrillation despite being on eliquis. The patient apparently ate breakfast this morning and was fine but at around 9 AM noticed sudden onset of left-sided weakness and incoordination. She did not call for help immediately and waited to see if symptoms improve. Later on as she did not feel better EMS were called and code stroke was activated and Route. Upon arrival patient was met by the stroke team and is noted as having left arm clumsiness and left leg weakness. CT scan of the head was obtained emergently which showed no acute abnormality in: Calcific changes are noted in the right parietal white matter. Patient's NIH stroke scale on admission was felt to be 3 by RN but by my documentation it was 4 Patient states she has taken her dose of eliquis this morning. Review of patient's medical records revealed that she had a right posterior limb internal capsule infarct adjacent to the calcific area in the right parietal white matter during the last admission a month ago. Eliquis was continued. She made good physical recovery and was able to lift by home with close supervision by her family.  LSN : 9 am 03/07/17 TPA given no pt is on eliquis NIHSS 4  Past Medical History:  Diagnosis Date  . Atrial fibrillation ([place])   . Diabetes mellitus without complication ([place])   . Hyperlipidemia   . Hypertension   . TIA (transient ischemic attack)     Past Surgical History:  Procedure Laterality Date  . ABDOMINAL HYSTERECTOMY    . TEE WITHOUT CARDIOVERSION N/A 01/30/2017   Procedure: TRANSESOPHAGEAL ECHOCARDIOGRAM (TEE);  Surgeon: Adrian Prows, MD;  [facility] END[facility]COPY;  Service: Cardiovascular;  Laterality: N/A;  . TUBAL LIGATION      Family History  Problem Relation Age of Onset  .  Heart attack Father     Social History:  reports that she has never smoked. She has never used smokeless tobacco. She reports that she does not drink alcohol or use drugs.  Allergies:  Allergies  Allergen Reactions  . Penicillins Hives    Medications: I have reviewed the patient's current medications.  Results for orders placed or performed during the hospital encounter of 03/07/17 (from the past 48 hour(s))  Protime-INR     Status: Abnormal   Collection Time: 03/07/17  1:44 PM  Result Value Ref Range   Prothrombin Time 16.4 (H) 11.4 - 15.2 seconds   INR 1.31   APTT     Status: Abnormal   Collection Time: 03/07/17  1:44 PM  Result Value Ref Range   aPTT 39 (H) 24 - 36 seconds    Comment:        IF BASELINE aPTT IS ELEVATED, SUGGEST PATIENT RISK ASSESSMENT BE USED TO DETERMINE APPROPRIATE ANTICOAGULANT THERAPY.   CBC     Status: Abnormal   Collection Time: 03/07/17  1:44 PM  Result Value Ref Range   WBC 6.9 4.0 - 10.5 K/uL   RBC 3.70 (L) 3.87 - 5.11 MIL/uL   Hemoglobin 11.2 (L) 12.0 - 15.0 g/dL   HCT 33.1 (L) 36.0 - 46.0 %   MCV 89.5 78.0 - 100.0 fL   MCH 30.3 26.0 - 34.0 pg   MCHC 33.8 30.0 - 36.0 g/dL   RDW 13.0 11.5 -  15.5 %   Platelets 157 150 - 400 K/uL  Differential     Status: None   Collection Time: 03/07/17  1:44 PM  Result Value Ref Range   Neutrophils Relative % 75 %   Neutro Abs 5.1 1.7 - 7.7 K/uL   Lymphocytes Relative 11 %   Lymphs Abs 0.8 0.7 - 4.0 K/uL   Monocytes Relative 8 %   Monocytes Absolute 0.6 0.1 - 1.0 K/uL   Eosinophils Relative 6 %   Eosinophils Absolute 0.4 0.0 - 0.7 K/uL   Basophils Relative 0 %   Basophils Absolute 0.0 0.0 - 0.1 K/uL  I-stat troponin, ED     Status: None   Collection Time: 03/07/17  1:48 PM  Result Value Ref Range   Troponin i, poc 0.00 0.00 - 0.08 ng/mL   Comment 3            Comment: Due to the release kinetics of cTnI, a negative result within the first hours of the onset of symptoms does not rule  out myocardial infarction with certainty. If myocardial infarction is still suspected, repeat the test at appropriate intervals.   I-Stat Chem 8, ED     Status: Abnormal   Collection Time: 03/07/17  1:50 PM  Result Value Ref Range   Sodium 142 135 - 145 mmol/L   Potassium 3.7 3.5 - 5.1 mmol/L   Chloride 107 101 - 111 mmol/L   BUN 9 6 - 20 mg/dL   Creatinine, Ser 0.70 0.44 - 1.00 mg/dL   Glucose, Bld 130 (H) 65 - 99 mg/dL   Calcium, Ion 1.14 (L) 1.15 - 1.40 mmol/L   TCO2 26 0 - 100 mmol/L   Hemoglobin 10.2 (L) 12.0 - 15.0 g/dL   HCT 30.0 (L) 36.0 - 46.0 %  CBG monitoring, ED     Status: Abnormal   Collection Time: 03/07/17  2:10 PM  Result Value Ref Range   Glucose-Capillary 154 (H) 65 - 99 mg/dL    Ct Head Code Stroke W/o Cm  Result Date: 03/07/2017 CLINICAL DATA:  Code stroke.  Left-sided weakness.  Recent stroke EXAM: CT HEAD WITHOUT CONTRAST TECHNIQUE: Contiguous axial images were obtained from the base of the skull through the vertex without intravenous contrast. COMPARISON:  MRI 01/17/2017, CT 01/05/2017 FINDINGS: Brain: Moderate atrophy. Physiologic calcification in the globus pallidus bilaterally. Asymmetric calcification right posterior and lateral thalamus unchanged from prior studies and appears chronic. Recent MRI demonstrated a small acute infarct in the posterior limb internal capsule. Negative for acute infarct, hemorrhage, or mass lesion. Vascular: Negative for hyperdense vessel Skull: Negative for skull fracture. Erosion of the dens with posterior pannus formation suggesting rheumatoid arthritis. Sinuses/Orbits: Air-fluid levels in the sphenoid sinus. Mucosal edema in the ethmoid sinuses. Bilateral lens replacement. Other: None ASPECTS ([place] Stroke Program Early CT Score) - Ganglionic level infarction (caudate, lentiform nuclei, internal capsule, insula, M1-M3 cortex): 7 - Supraganglionic infarction (M4-M6 cortex): 3 Total score (0-10 with 10 being normal): 10 IMPRESSION:  1. No acute intracranial abnormality 2. ASPECTS is 10 Electronically Signed   By: Franchot Gallo M.D.   On: 03/07/2017 14:06       ROS Blood pressure (!) 140/54, pulse 77, temperature 98.3 F (36.8 C), resp. rate 18, height 5' 4"  (1.626 m), weight 143 lb (64.9 kg), SpO2 100 %. Physical Exam frail elderly Caucasian lady currently not in distress. . Afebrile. Head is nontraumatic. Neck is supple without bruit.    Cardiac exam no murmur or gallop.  Lungs are clear to auscultation. Distal pulses are well felt. Neurological Exam :  Awake alert oriented 3 with normal speech and language function. No dysarthria or aphasia. She follows two-step commands well. Extraocular movements are full range without nystagmus. She blinks to threat bilaterally. Face is symmetric. Tongue is midline. Motor system exam revealed no upper extremity drift but significant ataxia and left upper extremity. Left lower extremity drift with ligating the ground. Impaired coordination on the left. Touch pinprick sensation seems preserved bilaterally. Plantars are downgoing. Deep tendon results in a symmetric. Gait was not tested.  -NIH Stroke scale score 4 Baseline modified Rankin score 3 Assessment/Plan: 83 year old lady with sudden onset of left leg weakness and left hand incoordination likely due to Brain stem infarct etiology likely embolic secondary to atrial fibrillation despite being on anticoagulation with eliquis. Patient is not a candidate for TPA due to being on eliquis and her neurological deficits are not suggestive of large vessel occlusion.  Plan admit to the medical hospitalist service. Check MRI scan the brain to confirm infarct. Will not order a CT angiogram or echocardiogram studies as she had them done recently. Will consider changing eliquis 2 Pradaxa due to different mechanism of action and patient having had several strokes on eliquis now. Physical occupational therapy and rehabilitation consults. I had a long  discussion with the bedside with the patient, as her son and daughter and answered questions about her care. Discussed with Dr. Gilford Raid Greater than 50% time during this 60 minute consultation visit was spent on counseling and coordination of care about her recurrent stroke, atrial fibrillation, treatment discussion and answering questions  SETHI,PRAMOD 03/07/2017, 2:14 PM    Note: This document was prepared with digital dictation and possible smart phrase technology. Any transcriptional errors that result from this process are unintentional.

## 2017-03-07 NOTE — ED Provider Notes (Signed)
[facility] DEPT Provider Note   CSN: [id number] Arrival date & time: 03/07/17  1341   An emergency department physician performed an initial assessment on this suspected stroke patient at 1342 (nanavati).  History   Chief Complaint Chief Complaint  Patient presents with  . Code Stroke    HPI Christy Sutton is a 83 y.o. female.  Patient was in her usual state of health until around 9am.  She started having LUE and LLE weakness.  She reports associated numbness of LUE and LLE. Her family members note she was crawling around on the floor because of weakness.  At baseline, patient is independent in walking and ADLs.  She denies visual disturbance, speech difficulty, facial droop, difficulty swallowing.   She is unsure of balance because she cannot walk 2/2 weakness.  She denies headache, confusion, CP, SOB, nausea, vomiting, dizziness.  She reports compliance with medications.  She was admitted in May for acute CVA.  She is anticoagulated on Eliquis.    Patient is well known to Dr Leonie Man, Neurologist.  He has evaluated patient and notes that this is the second CVA on Eliquis.         Past Medical History:  Diagnosis Date  . Atrial fibrillation ([place])   . Diabetes mellitus without complication ([place])   . Hyperlipidemia   . Hypertension   . TIA (transient ischemic attack)     Patient Active Problem List   Diagnosis Date Noted  . Cerebral embolism with cerebral infarction 03/07/2017  . Left-sided weakness 03/07/2017  . Stroke ([place]) 01/17/2017  . TIA (transient ischemic attack) 01/17/2017  . Dysmetria 01/17/2017  . Essential hypertension 01/17/2017  . Diabetes mellitus with complication ([place]) 56/38/7564  . Permanent atrial fibrillation ([place]) 01/17/2017  . Ischemic stroke [facility])     Past Surgical History:  Procedure Laterality Date  . ABDOMINAL HYSTERECTOMY    . TEE WITHOUT CARDIOVERSION N/A 01/30/2017   Procedure: TRANSESOPHAGEAL ECHOCARDIOGRAM (TEE);  Surgeon: Adrian Prows, MD;   Location: [facility];  Service: Cardiovascular;  Laterality: N/A;  . TUBAL LIGATION      OB History    No data available       Home Medications    Prior to Admission medications   Medication Sig Start Date End Date Taking? Authorizing Provider  aspirin 81 MG chewable tablet Chew 81 mg by mouth at bedtime.     [provider]  atenolol (TENORMIN) 25 MG tablet Take 25 mg by mouth daily.  06/26/16   [provider]  atorvastatin (LIPITOR) 20 MG tablet Take 1 tablet (20 mg total) by mouth daily at 6 PM. 01/19/17   Rosita Fire, MD  lisinopril (PRINIVIL,ZESTRIL) 5 MG tablet Take 5 mg by mouth daily.  07/14/16   [provider]  meclizine (ANTIVERT) 25 MG tablet Take 25 mg by mouth 2 (two) times daily as needed for dizziness.    [provider]  metFORMIN (GLUCOPHAGE-XR) 500 MG 24 hr tablet TAKE 500 MG BY MOUTH ONCE DAILY WITH BREAKFAST 01/25/16   [provider]    Family History Family History  Problem Relation Age of Onset  . Heart attack Father     Social History Social History  Substance Use Topics  . Smoking status: Never Smoker  . Smokeless tobacco: Never Used  . Alcohol use No     Allergies   Penicillins   Review of Systems Review of Systems  Constitutional: Negative for diaphoresis and fatigue.  HENT: Positive for hearing loss (chronic).  Negative for tinnitus, trouble swallowing and voice change.   Respiratory: Negative for cough, chest tightness and shortness of breath.   Cardiovascular: Negative for chest pain, palpitations and leg swelling.  Gastrointestinal: Negative for nausea and vomiting.  Musculoskeletal: Positive for gait problem.  Neurological: Positive for weakness and numbness. Negative for dizziness, facial asymmetry, speech difficulty, light-headedness and headaches.  Psychiatric/Behavioral: The patient is nervous/anxious (w/ MRI).      Physical Exam Updated Vital Signs BP (!) 153/69 (BP  Location: Left Arm)   Pulse 76   Temp 98.3 F (36.8 C)   Resp 19   Ht 5\' 4"  (1.626 m)   Wt 64.9 kg (143 lb)   SpO2 100%   BMI 24.55 kg/m   Physical Exam  Constitutional: She is oriented to person, place, and time. She appears well-developed and well-nourished. No distress.  Elderly female.  Family at bedside  HENT:  Head: Normocephalic and atraumatic.  Eyes: Conjunctivae and EOM are normal. Pupils are equal, round, and reactive to light.  Findings consistent with cataract surgery b/l  Neck: Normal range of motion. Neck supple.  Cardiovascular: Normal rate, regular rhythm, normal heart sounds and intact distal pulses.   Pulmonary/Chest: Effort normal and breath sounds normal. No respiratory distress.  Abdominal: Soft. Bowel sounds are normal. She exhibits no distension. There is no tenderness.  Musculoskeletal: She exhibits no edema.       Left shoulder: She exhibits decreased range of motion.       Left elbow: She exhibits decreased range of motion.       Left wrist: She exhibits decreased range of motion.       Left hip: She exhibits decreased range of motion.       Left knee: She exhibits decreased range of motion.       Left ankle: She exhibits decreased range of motion.  She has 2+/5 UE and LE strength; 5/5 RUE and RLE strength  Neurological: She is alert and oriented to person, place, and time. No cranial nerve deficit (she had chronic decreased hearing bilaterally) or sensory deficit. Coordination (heel to shin test abnormal on left, finger to nose test abnormal on left) abnormal.  Skin: Skin is warm. Capillary refill takes less than 2 seconds. She is not diaphoretic.  Psychiatric: She has a normal mood and affect. Her behavior is normal. Judgment and thought content normal.    ED Treatments / Results  Labs (all labs ordered are listed, but only abnormal results are displayed) Labs Reviewed  PROTIME-INR - Abnormal; Notable for the following:       Result Value    Prothrombin Time 16.4 (*)    All other components within normal limits  APTT - Abnormal; Notable for the following:    aPTT 39 (*)    All other components within normal limits  CBC - Abnormal; Notable for the following:    RBC 3.70 (*)    Hemoglobin 11.2 (*)    HCT 33.1 (*)    All other components within normal limits  COMPREHENSIVE METABOLIC PANEL - Abnormal; Notable for the following:    Glucose, Bld 133 (*)    Total Protein 5.8 (*)    Albumin 3.3 (*)    All other components within normal limits  CBG MONITORING, ED - Abnormal; Notable for the following:    Glucose-Capillary 154 (*)    All other components within normal limits  I-STAT CHEM 8, ED - Abnormal; Notable for the following:    Glucose, Bld  130 (*)    Calcium, Ion 1.14 (*)    Hemoglobin 10.2 (*)    HCT 30.0 (*)    All other components within normal limits  DIFFERENTIAL  I-STAT TROPOININ, ED    EKG  EKG Interpretation  Date/Time:  Wednesday March 07 2017 14:05:39 EDT Ventricular Rate:  82 PR Interval:    QRS Duration: 86 QT Interval:  385 QTC Calculation: 450 R Axis:   45 Text Interpretation:  Atrial fibrillation Abnormal T, consider ischemia, diffuse leads No significant change since last tracing Confirmed by Isla Pence [phone]) on 03/07/2017 2:18:44 PM Also confirmed by Isla Pence [phone]), editor Drema Pry [phone])  on 03/07/2017 2:19:48 PM       Radiology Ct Head Code Stroke W/o Cm  Result Date: 03/07/2017 CLINICAL DATA:  Code stroke.  Left-sided weakness.  Recent stroke EXAM: CT HEAD WITHOUT CONTRAST TECHNIQUE: Contiguous axial images were obtained from the base of the skull through the vertex without intravenous contrast. COMPARISON:  MRI 01/17/2017, CT 01/05/2017 FINDINGS: Brain: Moderate atrophy. Physiologic calcification in the globus pallidus bilaterally. Asymmetric calcification right posterior and lateral thalamus unchanged from prior studies and appears chronic. Recent MRI  demonstrated a small acute infarct in the posterior limb internal capsule. Negative for acute infarct, hemorrhage, or mass lesion. Vascular: Negative for hyperdense vessel Skull: Negative for skull fracture. Erosion of the dens with posterior pannus formation suggesting rheumatoid arthritis. Sinuses/Orbits: Air-fluid levels in the sphenoid sinus. Mucosal edema in the ethmoid sinuses. Bilateral lens replacement. Other: None ASPECTS ([place] Stroke Program Early CT Score) - Ganglionic level infarction (caudate, lentiform nuclei, internal capsule, insula, M1-M3 cortex): 7 - Supraganglionic infarction (M4-M6 cortex): 3 Total score (0-10 with 10 being normal): 10 IMPRESSION: 1. No acute intracranial abnormality 2. ASPECTS is 10 Electronically Signed   By: Franchot Gallo M.D.   On: 03/07/2017 14:06    Procedures Procedures (including critical care time)  Medications Ordered in ED Medications  atorvastatin (LIPITOR) tablet 20 mg (not administered)  meclizine (ANTIVERT) tablet 25 mg (not administered)  aspirin chewable tablet 81 mg (not administered)   stroke: mapping our early stages of recovery book (not administered)  0.9 %  sodium chloride infusion (not administered)  acetaminophen (TYLENOL) tablet 650 mg (not administered)    Or  acetaminophen (TYLENOL) solution 650 mg (not administered)    Or  acetaminophen (TYLENOL) suppository 650 mg (not administered)  senna-docusate (Senokot-S) tablet 1 tablet (not administered)  insulin aspart (novoLOG) injection 0-9 Units (not administered)     Initial Impression / Assessment and Plan / ED Course  I have reviewed the triage vital signs and the nursing notes.  Pertinent labs & imaging results that were available during my care of the patient were reviewed by me and considered in my medical decision making (see chart for details).    1410: Patient's care discussed with Dr Leonie Man, Neuro.  He confirms new CVA.  No TPA.  CT head without evidence of bleed  or acute abnormalities.  Neuro to follow and will consider alternative to Eliquis given 2nd CVA through Eliquis.  He would like MRI brain but limited work up, given workup performed in 01/2017 for acute stroke.  1430: Discussed care with hospitalist, who agrees to admit.  Final Clinical Impressions(s) / ED Diagnoses   Final diagnoses:  Cerebrovascular accident (CVA), unspecified mechanism ([place])  Left-sided weakness   Christy Sutton is a 83 y.o. female that presents with LUE and LLE weakness.  Code stroke called on presentation.  CT  head with no evidence of acute bleed/ acute processes.  Neurology has evaluated and there is concern for recurrent stroke on Eliquis.  Dr Leonie Man recommends admission for observation and MRI brain.  Her physical exam demonstrates LUE and LLE weakness.  She is unable to perform UE and LE cerebellar testing on the Left due to weakness.  Neurologic exam otherwise unremarkable.  Care was discussed with [facility], who will admit for further evaluation and management of CVA.  New Prescriptions New Prescriptions   No medications on file     Janora Norlander, DO 03/07/17 1441    Isla Pence, MD 03/07/17 [phone]

## 2017-03-07 NOTE — H&P (Signed)
History and Physical    Christy Sutton DGU:[id number] DOB: 1926/03/09 DOA: 03/07/2017  PCP: System, Pcp Not In Patient coming from: home  Chief Complaint: left sided weakness  HPI: Christy Sutton is a very pleasant very HOH 83 y.o. female with medical history significant stroke, diabetes, A. fib, hypertension workup this morning noting worsening left-sided weakness. She's had 2 prior strokes in April and May of this year and A. fib. Initial evaluation concerning for a third stroke or extension of previous stroke. [facility] asked to admit.  Information is obtained from the chart and the patient and the family is at the bedside.. She states she woke up this morning at breakfast and was fine. Then around 9 AM she noticed sudden onset left-sided weakness and incoordination. She did not cough perhaps related to serious symptoms would resolve. Recently she did begin to feel better EMS was called and a code stroke was activated. In the emergency department stroke team was present. CT of the head was obtained showed no acute abnormality. She denies headache visual disturbances numbness tingling of extremities. She denies difficulty chewing or swallowing. She denies this pain palpitation shortness of breath. She denies dysuria hematuria frequency or urgency.    ED Course: Evaluated by the stroke team who recommended medical admission and repeat MRI  Review of Systems: As per HPI otherwise all other systems reviewed and are negative.   Ambulatory Status:lives at home with 24/7 assistance  Past Medical History:  Diagnosis Date  . Atrial fibrillation ([place])   . Diabetes mellitus without complication ([place])   . Hyperlipidemia   . Hypertension   . TIA (transient ischemic attack)     Past Surgical History:  Procedure Laterality Date  . ABDOMINAL HYSTERECTOMY    . TEE WITHOUT CARDIOVERSION N/A 01/30/2017   Procedure: TRANSESOPHAGEAL ECHOCARDIOGRAM (TEE);  Surgeon: Adrian Prows, MD;  Location: [facility];   Service: Cardiovascular;  Laterality: N/A;  . TUBAL LIGATION      Social History   Social History  . Marital status: Single    Spouse name: N/A  . Number of children: N/A  . Years of education: N/A   Occupational History  . Not on file.   Social History Main Topics  . Smoking status: Never Smoker  . Smokeless tobacco: Never Used  . Alcohol use No  . Drug use: No  . Sexual activity: Not on file   Other Topics Concern  . Not on file   Social History Narrative  . No narrative on file  lives at home with caregivers. Family close by  Allergies  Allergen Reactions  . Penicillins Hives    Family History  Problem Relation Age of Onset  . Heart attack Father     Prior to Admission medications   Medication Sig Start Date End Date Taking? Authorizing Provider  aspirin 81 MG chewable tablet Chew 81 mg by mouth at bedtime.     [provider]  atenolol (TENORMIN) 25 MG tablet Take 25 mg by mouth daily.  06/26/16   [provider]  atorvastatin (LIPITOR) 20 MG tablet Take 1 tablet (20 mg total) by mouth daily at 6 PM. 01/19/17   Rosita Fire, MD  lisinopril (PRINIVIL,ZESTRIL) 5 MG tablet Take 5 mg by mouth daily.  07/14/16   [provider]  meclizine (ANTIVERT) 25 MG tablet Take 25 mg by mouth 2 (two) times daily as needed for dizziness.    [provider]  metFORMIN (GLUCOPHAGE-XR) 500 MG 24 hr tablet TAKE  500 MG BY MOUTH ONCE DAILY WITH BREAKFAST 01/25/16   [provider]    Physical Exam: Vitals:   03/07/17 1409 03/07/17 1415  BP: (!) 140/54 (!) 153/69  Pulse: 77 76  Resp: 18 19  Temp: 98.3 F (36.8 C)   SpO2: 100% 100%  Weight: 64.9 kg (143 lb)   Height: 5\' 4"  (1.626 m)      General:  Appears calm and comfortable, slightly pale very hard of hearing Eyes:  PERRL, EOMI, normal lids, iris ENT:  grossly normal hearing, lips & tongue, his membranes of her mouth are moist and pink Neck:  no LAD, masses or  thyromegaly Cardiovascular:  Irregularly irregular, no m/r/g. No LE edema. Pulses present and palpable Respiratory:  CTA bilaterally, no w/r/r. Normal respiratory effort. Abdomen:  soft, ntnd, positive bowel sounds throughout no guarding or rebounding Skin:  no rash or induration seen on limited exam Musculoskeletal:  grossly normal tone BUE/BLE, good ROM, no bony abnormality Psychiatric:  grossly normal mood and affect, speech fluent and appropriate, AOx3 Neurologic:  Alert and oriented 3 speech clear facial symmetry tongue midline. Right grip 5 out of 5 left grip 4 out of 5. Right lower extremity strength 5 out of 5 left lower extremity strength 1/5 sensation intact  Labs on Admission: I have personally reviewed following labs and imaging studies  CBC:  Recent Labs Lab 03/07/17 1344 03/07/17 1350  WBC 6.9  --   NEUTROABS 5.1  --   HGB 11.2* 10.2*  HCT 33.1* 30.0*  MCV 89.5  --   PLT 157  --    Basic Metabolic Panel:  Recent Labs Lab 03/07/17 1344 03/07/17 1350  NA 140 142  K 3.7 3.7  CL 109 107  CO2 26  --   GLUCOSE 133* 130*  BUN 7 9  CREATININE 0.74 0.70  CALCIUM 9.1  --    GFR: Estimated Creatinine Clearance: 41.2 mL/min (by C-G formula based on SCr of 0.7 mg/dL). Liver Function Tests:  Recent Labs Lab 03/07/17 1344  AST 26  ALT 14  ALKPHOS 41  BILITOT 0.4  PROT 5.8*  ALBUMIN 3.3*   No results for input(s): LIPASE, AMYLASE in the last 168 hours. No results for input(s): AMMONIA in the last 168 hours. Coagulation Profile:  Recent Labs Lab 03/07/17 1344  INR 1.31   Cardiac Enzymes: No results for input(s): CKTOTAL, CKMB, CKMBINDEX, TROPONINI in the last 168 hours. BNP (last 3 results) No results for input(s): PROBNP in the last 8760 hours. HbA1C: No results for input(s): HGBA1C in the last 72 hours. CBG:  Recent Labs Lab 03/07/17 1410  GLUCAP 154*   Lipid Profile: No results for input(s): CHOL, HDL, LDLCALC, TRIG, CHOLHDL, LDLDIRECT in  the last 72 hours. Thyroid Function Tests: No results for input(s): TSH, T4TOTAL, FREET4, T3FREE, THYROIDAB in the last 72 hours. Anemia Panel: No results for input(s): VITAMINB12, FOLATE, FERRITIN, TIBC, IRON, RETICCTPCT in the last 72 hours. Urine analysis: No results found for: COLORURINE, APPEARANCEUR, LABSPEC, PHURINE, GLUCOSEU, HGBUR, BILIRUBINUR, KETONESUR, PROTEINUR, UROBILINOGEN, NITRITE, LEUKOCYTESUR  Creatinine Clearance: Estimated Creatinine Clearance: 41.2 mL/min (by C-G formula based on SCr of 0.7 mg/dL).  Sepsis Labs: @LABRCNTIP (procalcitonin:4,lacticidven:4) )No results found for this or any previous visit (from the past 240 hour(s)).   Radiological Exams on Admission: Ct Head Code Stroke W/o Cm  Result Date: 03/07/2017 CLINICAL DATA:  Code stroke.  Left-sided weakness.  Recent stroke EXAM: CT HEAD WITHOUT CONTRAST TECHNIQUE: Contiguous axial images were obtained from the  base of the skull through the vertex without intravenous contrast. COMPARISON:  MRI 01/17/2017, CT 01/05/2017 FINDINGS: Brain: Moderate atrophy. Physiologic calcification in the globus pallidus bilaterally. Asymmetric calcification right posterior and lateral thalamus unchanged from prior studies and appears chronic. Recent MRI demonstrated a small acute infarct in the posterior limb internal capsule. Negative for acute infarct, hemorrhage, or mass lesion. Vascular: Negative for hyperdense vessel Skull: Negative for skull fracture. Erosion of the dens with posterior pannus formation suggesting rheumatoid arthritis. Sinuses/Orbits: Air-fluid levels in the sphenoid sinus. Mucosal edema in the ethmoid sinuses. Bilateral lens replacement. Other: None ASPECTS ([place] Stroke Program Early CT Score) - Ganglionic level infarction (caudate, lentiform nuclei, internal capsule, insula, M1-M3 cortex): 7 - Supraganglionic infarction (M4-M6 cortex): 3 Total score (0-10 with 10 being normal): 10 IMPRESSION: 1. No acute  intracranial abnormality 2. ASPECTS is 10 Electronically Signed   By: Franchot Gallo M.D.   On: 03/07/2017 14:06    EKG: Independently reviewed. Atrial fibrillation Abnormal T, consider ischemia, diffuse leads No significant change since last tracing  Assessment/Plan Principal Problem:   Left-sided weakness Active Problems:   Essential hypertension   Diabetes mellitus with complication (HCC)   Permanent atrial fibrillation (HCC)   Cerebral embolism with cerebral infarction   #1. Left-sided weakness. Sudden onset in the setting of recent strokes one in April and one in May of this year. Code stroke called. Stroke team evaluated in the emergency department. She was outside the window TPA. CT without acute abnormality. Neuro opined likely due to brainstem infarct etiology likely embolic secondary to A. Fib. Recommended MRI/MRA and medicine admission. -Admit to telemetry -Bedside swallow eval -Modified diet once he passes -MRI/MRA per neuro recommendation -Physical therapy, occupational therapy -No echo carotid Dopplers A1c or lipid panel given that she recently had those done -anti platelet per neuro  #2. A. Fib. Rate controlled. On Eliquis. chadvasc score 4. Home medications include atenolol -Hold atenolol for now -anti- coagulation per neuro given that she's had 2 strokes while taking eliquis -see neuro note  3. Hypertension. Fair control in the emergency department. Home meds include lisinopril, metoprolol -Hold lisinopril and metoprolol for now -Monitor -hydralazine prn  #4. Diabetes. On oral agents only. Serum glucose 130. Recent low been A1c 6.0 -Hold oral agents for now -Carb modified diet -Sliding scale for optimal control     DVT prophylaxis: scd Code Status: dnr  Family Communication: daughter at bedside  Disposition Plan: to be determined  Consults called: sethi neuro Admission status: inpateint    Radene Gunning MD [facility]  If 7PM-7AM, please  contact night-coverage [email or web address] Password [email or web address]  03/07/2017, 2:50 PM

## 2017-03-07 NOTE — Code Documentation (Signed)
83yo female arriving to [facility] via [place] at 1341.  Patient from home where she woke up this morning at her baseline.  Patient with h/o stroke x 2 in April and May 2018 and atrial fibrillation on Eliquis.  Patient's daughter reports patient takes medications as directed.  Patient ate breakfast this morning and later noticed around 0900 that she had left sided weakness and numbness.  She did not seek help at that time d/t waiting to see if symptoms would improve.  Patient lives at home alone.  EMS later called and activated a code stroke for left arm and leg weakness and difficulty walking.  Stroke team at the bedside on patient arrival.  Labs drawn and patient cleared for CT by Dr. Kathrynn Humble.  Patient to CT with team.  CT completed.  Dr. Leonie Man to the bedside.  NIHSS 3, see documentation for details and code stroke times.  Patient with LUE ataxia and LLE weakness on exam.  Patient is outside the window and contraindicated for treatment with tPA d/t taking Eliquis.  Patient to be admitted.  Bedside handoff with ED RN Marya Amsler.

## 2017-03-08 DIAGNOSIS — I10 Essential (primary) hypertension: Secondary | ICD-10-CM

## 2017-03-08 DIAGNOSIS — E118 Type 2 diabetes mellitus with unspecified complications: Secondary | ICD-10-CM

## 2017-03-08 DIAGNOSIS — R531 Weakness: Secondary | ICD-10-CM

## 2017-03-08 DIAGNOSIS — E119 Type 2 diabetes mellitus without complications: Secondary | ICD-10-CM

## 2017-03-08 DIAGNOSIS — Z8673 Personal history of transient ischemic attack (TIA), and cerebral infarction without residual deficits: Secondary | ICD-10-CM

## 2017-03-08 DIAGNOSIS — D62 Acute posthemorrhagic anemia: Secondary | ICD-10-CM

## 2017-03-08 DIAGNOSIS — I482 Chronic atrial fibrillation: Secondary | ICD-10-CM

## 2017-03-08 DIAGNOSIS — I63411 Cerebral infarction due to embolism of right middle cerebral artery: Secondary | ICD-10-CM

## 2017-03-08 LAB — GLUCOSE, CAPILLARY
Glucose-Capillary: 111 mg/dL — ABNORMAL HIGH (ref 65–99)
Glucose-Capillary: 119 mg/dL — ABNORMAL HIGH (ref 65–99)
Glucose-Capillary: 107 mg/dL — ABNORMAL HIGH (ref 65–99)
Glucose-Capillary: 119 mg/dL — ABNORMAL HIGH (ref 65–99)

## 2017-03-08 MED ORDER — ATENOLOL 25 MG PO TABS
25.0000 mg | ORAL_TABLET | Freq: Every day | ORAL | Status: DC
  Administered 2017-03-08 – 2017-03-09 (×2): 25 mg via ORAL
  Filled 2017-03-08 (×2): qty 1

## 2017-03-08 MED ORDER — DABIGATRAN ETEXILATE MESYLATE 150 MG PO CAPS
150.0000 mg | ORAL_CAPSULE | Freq: Two times a day (BID) | ORAL | Status: DC
  Administered 2017-03-08 – 2017-03-09 (×3): 150 mg via ORAL
  Filled 2017-03-08 (×3): qty 1

## 2017-03-08 NOTE — Progress Notes (Signed)
ANTICOAGULATION CONSULT NOTE - Initial Consult  Pharmacy Consult for Pradaxa Indication: atrial fibrillation/stroke  Allergies  Allergen Reactions  . Penicillins Hives    Has patient had a PCN reaction causing immediate rash, facial/tongue/throat swelling, SOB or lightheadedness with hypotension: Yes Has patient had a PCN reaction causing severe rash involving mucus membranes or skin necrosis: Unknown Has patient had a PCN reaction that required hospitalization: Unknown Has patient had a PCN reaction occurring within the last 10 years: No If all of the above answers are "NO", then may proceed with Cephalosporin use.     Patient Measurements: Height: 5\' 4"  (162.6 cm) Weight: 143 lb (64.9 kg) IBW/kg (Calculated) : 54.7  Vital Signs: Temp: 98.3 F (36.8 C) (06/28 0925) Temp Source: Oral (06/28 0925) BP: 157/56 (06/28 0925) Pulse Rate: 86 (06/28 0925)  Labs:  Recent Labs  03/07/17 1344 03/07/17 1350  HGB 11.2* 10.2*  HCT 33.1* 30.0*  PLT 157  --   APTT 39*  --   LABPROT 16.4*  --   INR 1.31  --   CREATININE 0.74 0.70    Estimated Creatinine Clearance: 41.2 mL/min (by C-G formula based on SCr of 0.7 mg/dL).   Medical History: Past Medical History:  Diagnosis Date  . Atrial fibrillation ([place])   . Diabetes mellitus without complication ([place])   . Hyperlipidemia   . Hypertension   . TIA (transient ischemic attack)    Assessment: 89 YOF on eliquis 5mg  BID PTA for afib and 2 previous strokes, admitted today with recurrent stroke. Neurology recommended changing eliquis to pradaxa, since pt had multiple strokes while on eliquis. Last eliquis dose 6/27 AM Scr 0.7, est. crcl ~ 40 ml/min. hgb 11.2, pltc 157K  Goal of Therapy:   Monitor platelets by anticoagulation protocol: Yes   Plan:  Pradaxa 150 mg po BID Monitor renal function and CBC  Maryanna Shape, PharmD, BCPS  Clinical Pharmacist  Pager: [phone]   03/08/2017,2:35 PM

## 2017-03-08 NOTE — Progress Notes (Addendum)
PROGRESS NOTE    Christy Sutton   XBM:[id number]  DOB: 06-10-1926  DOA: 03/07/2017 PCP: System, Pcp Not In   Brief Narrative:  Christy Sutton  83 y.o. female with medical history significant stroke, diabetes, A. fib, hypertension, poor hearing presenting to the hospital with worsening left-sided weakness and is found to have an ischemic infarct. She's had 2 prior strokes in April and May of this year. The second occurred while on Eliquis.   Subjective: Feels weak in the left arm and leg. Wants to go home today. No other complaints.   Assessment & Plan:   Principal Problem:   Left-sided arm and leg weakness/  Cerebral embolism with cerebral infarction  - MRI head >> RIGHT lateral thalamus and posterior limb internal capsule, adjacent to the area of previous ischemia on the Jan 17, 2017 scan.  - 2nd infarct on Eliquis- Neurology recommending switching to Pradaxa - PT/OT eval >> CIR consult recommended  Active Problems:   Essential hypertension - Lisinopril and Atenolol held overnight due to CVA    Diabetes mellitus with complication ([place]) - SSI    Permanent atrial fibrillation (HCC) - Atenolol on hold for permissive HTN- will resume tomorrow AM  DVT prophylaxis: Pradaxa Code Status: DNR Family Communication:  Disposition Plan: CIR vs Rehab facility Consultants:   neurology Procedures:    Antimicrobials:  Anti-infectives    None       Objective: Vitals:   03/08/17 0400 03/08/17 0600 03/08/17 0925 03/08/17 1520  BP: (!) 147/60 (!) 160/84 (!) 157/56 (!) 146/77  Pulse: 87 (!) 105 86 79  Resp: 18 20 20 20   Temp:  98.8 F (37.1 C) 98.3 F (36.8 C) 98.8 F (37.1 C)  TempSrc:  Oral Oral Oral  SpO2: 96% 92% 96% 95%  Weight:      Height:        Intake/Output Summary (Last 24 hours) at 03/08/17 1713 Last data filed at 03/08/17 0328  Gross per 24 hour  Intake           126.33 ml  Output              250 ml  Net          -123.67 ml   Filed Weights   03/07/17 1409    Weight: 64.9 kg (143 lb)    Examination: General exam: Appears comfortable  HEENT: PERRLA, oral mucosa moist, no sclera icterus or thrush Respiratory system: Clear to auscultation. Respiratory effort normal. Cardiovascular system: S1 & S2 heard, RRR.  No murmurs  Gastrointestinal system: Abdomen soft, non-tender, nondistended. Normal bowel sound. No organomegaly Central nervous system: Alert and oriented. Ataxia when attempting to touch nose with left hand. Weakness of left leg 3/5 Extremities: No cyanosis, clubbing or edema Skin: No rashes or ulcers Psychiatry:  Mood & affect appropriate.     Data Reviewed: I have personally reviewed following labs and imaging studies  CBC:  Recent Labs Lab 03/07/17 1344 03/07/17 1350  WBC 6.9  --   NEUTROABS 5.1  --   HGB 11.2* 10.2*  HCT 33.1* 30.0*  MCV 89.5  --   PLT 157  --    Basic Metabolic Panel:  Recent Labs Lab 03/07/17 1344 03/07/17 1350  NA 140 142  K 3.7 3.7  CL 109 107  CO2 26  --   GLUCOSE 133* 130*  BUN 7 9  CREATININE 0.74 0.70  CALCIUM 9.1  --    GFR: Estimated Creatinine Clearance: 41.2 mL/min (by  C-G formula based on SCr of 0.7 mg/dL). Liver Function Tests:  Recent Labs Lab 03/07/17 1344  AST 26  ALT 14  ALKPHOS 41  BILITOT 0.4  PROT 5.8*  ALBUMIN 3.3*   No results for input(s): LIPASE, AMYLASE in the last 168 hours. No results for input(s): AMMONIA in the last 168 hours. Coagulation Profile:  Recent Labs Lab 03/07/17 1344  INR 1.31   Cardiac Enzymes: No results for input(s): CKTOTAL, CKMB, CKMBINDEX, TROPONINI in the last 168 hours. BNP (last 3 results) No results for input(s): PROBNP in the last 8760 hours. HbA1C: No results for input(s): HGBA1C in the last 72 hours. CBG:  Recent Labs Lab 03/07/17 1410 03/07/17 2228 03/08/17 0638 03/08/17 1130 03/08/17 1632  GLUCAP 154* 129* 111* 119* 107*   Lipid Profile: No results for input(s): CHOL, HDL, LDLCALC, TRIG, CHOLHDL,  LDLDIRECT in the last 72 hours. Thyroid Function Tests: No results for input(s): TSH, T4TOTAL, FREET4, T3FREE, THYROIDAB in the last 72 hours. Anemia Panel: No results for input(s): VITAMINB12, FOLATE, FERRITIN, TIBC, IRON, RETICCTPCT in the last 72 hours. Urine analysis: No results found for: COLORURINE, APPEARANCEUR, LABSPEC, PHURINE, GLUCOSEU, HGBUR, BILIRUBINUR, KETONESUR, PROTEINUR, UROBILINOGEN, NITRITE, LEUKOCYTESUR Sepsis Labs: @LABRCNTIP (procalcitonin:4,lacticidven:4) )No results found for this or any previous visit (from the past 240 hour(s)).       Radiology Studies: [name] Contrast  Result Date: 03/07/2017 CLINICAL DATA:  Worsening LEFT-sided weakness, beginning earlier today. EXAM: MRI HEAD WITHOUT CONTRAST MRA HEAD WITHOUT CONTRAST TECHNIQUE: Multiplanar, multiecho pulse sequences of the brain and surrounding structures were obtained without intravenous contrast. Angiographic images of the head were obtained using MRA technique without contrast. COMPARISON:  CT head 03/07/2017.  MR head 01/17/2017. FINDINGS: MRI HEAD FINDINGS Brain: Ovoid 1 cm area of restricted diffusion, corresponding low ADC, affects the posterior limb internal capsule and thalamus on the RIGHT, consistent with acute infarction. This is slightly lateral to the acute infarct demonstrated in May. There is extension cephalad to the periventricular white matter. No hemorrhage, mass lesion, or extra-axial fluid. Global atrophy with hydrocephalus ex vacuo. Mild white matter disease. Mineralization of not only the basal ganglia but the lateral thalamus and adjacent white matter, representing an area of chronic ischemia. Vascular: Normal flow voids. Skull and upper cervical spine: Normal marrow signal. Sinuses/Orbits: Negative. Other: None. MRA HEAD FINDINGS There is slight non stenotic irregularity of the cavernous and supraclinoid carotid arteries, without measurable stenosis. There is wide patency of basilar  artery, and both vertebral arteries. No proximal stenosis of the anterior or middle cerebral arteries. No proximal PCA stenosis. Moderate irregularity in the RIGHT greater than LEFT P2 and P3 segments. No MCA branch irregularity or occlusion. No saccular aneurysm. IMPRESSION: New area of acute cerebral infarction involving the RIGHT lateral thalamus and posterior limb internal capsule, adjacent to the area of previous ischemia on the Jan 17, 2017 scan. Chronic intracranial changes as described. No proximal vascular stenosis or occlusion. Electronically Signed   By: Staci Righter M.D.   On: 03/07/2017 19:00   Mr Brain Wo Contrast  Result Date: 03/07/2017 CLINICAL DATA:  Worsening LEFT-sided weakness, beginning earlier today. EXAM: MRI HEAD WITHOUT CONTRAST MRA HEAD WITHOUT CONTRAST TECHNIQUE: Multiplanar, multiecho pulse sequences of the brain and surrounding structures were obtained without intravenous contrast. Angiographic images of the head were obtained using MRA technique without contrast. COMPARISON:  CT head 03/07/2017.  MR head 01/17/2017. FINDINGS: MRI HEAD FINDINGS Brain: Ovoid 1 cm area of restricted diffusion, corresponding low ADC, affects  the posterior limb internal capsule and thalamus on the RIGHT, consistent with acute infarction. This is slightly lateral to the acute infarct demonstrated in May. There is extension cephalad to the periventricular white matter. No hemorrhage, mass lesion, or extra-axial fluid. Global atrophy with hydrocephalus ex vacuo. Mild white matter disease. Mineralization of not only the basal ganglia but the lateral thalamus and adjacent white matter, representing an area of chronic ischemia. Vascular: Normal flow voids. Skull and upper cervical spine: Normal marrow signal. Sinuses/Orbits: Negative. Other: None. MRA HEAD FINDINGS There is slight non stenotic irregularity of the cavernous and supraclinoid carotid arteries, without measurable stenosis. There is wide patency  of basilar artery, and both vertebral arteries. No proximal stenosis of the anterior or middle cerebral arteries. No proximal PCA stenosis. Moderate irregularity in the RIGHT greater than LEFT P2 and P3 segments. No MCA branch irregularity or occlusion. No saccular aneurysm. IMPRESSION: New area of acute cerebral infarction involving the RIGHT lateral thalamus and posterior limb internal capsule, adjacent to the area of previous ischemia on the Jan 17, 2017 scan. Chronic intracranial changes as described. No proximal vascular stenosis or occlusion. Electronically Signed   By: Staci Righter M.D.   On: 03/07/2017 19:00   Ct Head Code Stroke W/o Cm  Result Date: 03/07/2017 CLINICAL DATA:  Code stroke.  Left-sided weakness.  Recent stroke EXAM: CT HEAD WITHOUT CONTRAST TECHNIQUE: Contiguous axial images were obtained from the base of the skull through the vertex without intravenous contrast. COMPARISON:  MRI 01/17/2017, CT 01/05/2017 FINDINGS: Brain: Moderate atrophy. Physiologic calcification in the globus pallidus bilaterally. Asymmetric calcification right posterior and lateral thalamus unchanged from prior studies and appears chronic. Recent MRI demonstrated a small acute infarct in the posterior limb internal capsule. Negative for acute infarct, hemorrhage, or mass lesion. Vascular: Negative for hyperdense vessel Skull: Negative for skull fracture. Erosion of the dens with posterior pannus formation suggesting rheumatoid arthritis. Sinuses/Orbits: Air-fluid levels in the sphenoid sinus. Mucosal edema in the ethmoid sinuses. Bilateral lens replacement. Other: None ASPECTS ([place] Stroke Program Early CT Score) - Ganglionic level infarction (caudate, lentiform nuclei, internal capsule, insula, M1-M3 cortex): 7 - Supraganglionic infarction (M4-M6 cortex): 3 Total score (0-10 with 10 being normal): 10 IMPRESSION: 1. No acute intracranial abnormality 2. ASPECTS is 10 Electronically Signed   By: Franchot Gallo M.D.    On: 03/07/2017 14:06      Scheduled Meds: . aspirin  81 mg Oral QHS  . atorvastatin  20 mg Oral q1800  . dabigatran  150 mg Oral Q12H  . insulin aspart  0-9 Units Subcutaneous TID WC   Continuous Infusions: . sodium chloride 10 mL/hr at 03/07/17 2101     LOS: 1 day    Time spent in minutes: 35    Debbe Odea, MD [facility] Pager: [email or web address] Password [facility] 03/08/2017, 5:13 PM

## 2017-03-08 NOTE — Discharge Instructions (Signed)

## 2017-03-08 NOTE — Evaluation (Signed)
Physical Therapy Evaluation Patient Details Name: Christy Sutton MRN: [id number] DOB: [birth year] Today's Date: 03/08/2017   History of Present Illness  Pt is a very pleasant very HOH 83 y.o. female with medical history significant stroke, diabetes, A. fib, hypertension workup this morning noting worsening left-sided weakness. She's had 2 prior strokes in April and May of this year and A. Fib. CT of the head was obtained showed no acute abnormality. MRI revealed acute cerebral infarction involving the RIGHT lateral thalamus and posterior limb internal capsule.  Clinical Impression  Pt with significant left sided weakness, decreased safety awareness and significant assist needed to walk a short distance with RW in her room.  She would benefit from intensive post acute rehab prior to returning home.    PT to follow acutely for deficits listed below.     Follow Up Recommendations CIR    Equipment Recommendations  Rolling walker with 5" wheels;Other (comment) (left platform RW vs hand splint for RW)    Recommendations for Other Services Rehab consult     Precautions / Restrictions Precautions Precautions: Fall Restrictions Weight Bearing Restrictions: No      Mobility  Bed Mobility               General bed mobility comments: pt sitting OOB in recliner chair when therapist entered room  Transfers Overall transfer level: Needs assistance Equipment used: Rolling walker (2 wheeled) Transfers: Sit to/from Stand Sit to Stand: +2 physical assistance;+2 safety/equipment;Mod assist         General transfer comment: block LLE, and assist to support trunk during transitions, verbal cues for safe hand placement  Ambulation/Gait Ambulation/Gait assistance: +2 physical assistance;Mod assist Ambulation Distance (Feet): 5 Feet Assistive device: Rolling walker (2 wheeled) Gait Pattern/deviations: Step-to pattern;Decreased step length - left;Decreased stance time - left;Decreased  dorsiflexion - left;Staggering left Gait velocity: decreased Gait velocity interpretation: Below normal speed for age/gender General Gait Details: Pt with significant left leg weakness, assist to both stabilize and progress left leg with not great grip control of the left hand on the RW, second person attempting to support trunk and weight shift pt to the right.  Pt trying to pick up the walker to get it over the IV pole despite cues to keep the walker on the ground (not sure if she heard me) without awareness that this might not be safe.  Chair pulled behind for safety.        Modified Rankin (Stroke Patients Only) Modified Rankin (Stroke Patients Only) Pre-Morbid Rankin Score: No significant disability Modified Rankin: Moderately severe disability     Balance Overall balance assessment: Needs assistance Sitting-balance support: Single extremity supported;Feet supported Sitting balance-Leahy Scale: Poor     Standing balance support: Bilateral upper extremity supported;During functional activity Standing balance-Leahy Scale: Poor Standing balance comment: Two person heavy mod assist to stand and for gait with RW for support.                              Pertinent Vitals/Pain Pain Assessment: No/denies pain    Home Living Family/patient expects to be discharged to:: Private residence Living Arrangements: Children Available Help at Discharge: Family;Neighbor;Available 24 hours/day Type of Home: House Home Access: Stairs to enter Entrance Stairs-Rails: Right Entrance Stairs-Number of Steps: 6 Home Layout: One level Home Equipment: Clinical cytogeneticist - 2 wheels      Prior Function Level of Independence: Independent with assistive device(s)  Comments: pt ambulates with use of RW     Hand Dominance   Dominant Hand: Right    Extremity/Trunk Assessment   Upper Extremity Assessment Upper Extremity Assessment: Defer to OT evaluation LUE Deficits /  Details: sensation grossly intact; grasp is 3/5, able to extend digits with increased time, elbow flexion 3/5, shoulder flexion to 75 degrees but unable to maintain LUE Coordination: decreased fine motor;decreased gross motor    Lower Extremity Assessment Lower Extremity Assessment: LLE deficits/detail LLE Deficits / Details: left leg with 0/5 ankle DF, 3/5 ankle PF, kne extension 3-/5, hip flexion 3-/5 LLE Coordination: decreased fine motor;decreased gross motor    Cervical / Trunk Assessment Cervical / Trunk Assessment: Normal (slightly rounded head and shoulders)  Communication   Communication: HOH  Cognition Arousal/Alertness: Awake/alert Behavior During Therapy: Impulsive Overall Cognitive Status: Impaired/Different from baseline Area of Impairment: Following commands;Safety/judgement;Problem solving;Attention;Awareness                   Current Attention Level: Selective Memory: Decreased short-term memory Following Commands: Follows one step commands with increased time Safety/Judgement: Decreased awareness of deficits;Decreased awareness of safety Awareness: Emergent Problem Solving: Requires verbal cues;Requires tactile cues General Comments: Pt very easily distracted, tangential in conversation, attempts to pick up RW during ambulation (no safety awarenes) and require verbal and tactile cues. Pt also perseverated on mowing her grass and being out in the yard. Her hearing deficit also does not help our ability to communicate with her effectively and her impulsiveness.       General Comments General comments (skin integrity, edema, etc.): Granddaughter came in at the end of session and confirmed home information        Assessment/Plan    PT Assessment Patient needs continued PT services  PT Problem List Decreased strength;Decreased activity tolerance;Decreased balance;Decreased mobility;Decreased coordination;Decreased cognition;Decreased safety  awareness;Decreased knowledge of use of DME;Decreased knowledge of precautions       PT Treatment Interventions DME instruction;Gait training;Stair training;Functional mobility training;Therapeutic activities;Therapeutic exercise;Balance training;Neuromuscular re-education;Cognitive remediation;Patient/family education    PT Goals (Current goals can be found in the Care Plan section)  Acute Rehab PT Goals Patient Stated Goal: get back to mowing the lawn PT Goal Formulation: With patient Time For Goal Achievement: 03/22/17 Potential to Achieve Goals: Good    Frequency Min 4X/week   Barriers to discharge        Co-evaluation PT/OT/SLP Co-Evaluation/Treatment: Yes Reason for Co-Treatment: For patient/therapist safety;Complexity of the patient's impairments (multi-system involvement);To address functional/ADL transfers PT goals addressed during session: Mobility/safety with mobility;Balance;Strengthening/ROM;Proper use of DME OT goals addressed during session: ADL's and self-care       AM-PAC PT "6 Clicks" Daily Activity  Outcome Measure Difficulty turning over in bed (including adjusting bedclothes, sheets and blankets)?: Total Difficulty moving from lying on back to sitting on the side of the bed? : Total Difficulty sitting down on and standing up from a chair with arms (e.g., wheelchair, bedside commode, etc,.)?: Total Help needed moving to and from a bed to chair (including a wheelchair)?: A Lot Help needed walking in hospital room?: A Lot Help needed climbing 3-5 steps with a railing? : Total 6 Click Score: 8    End of Session Equipment Utilized During Treatment: Gait belt Activity Tolerance: Patient tolerated treatment well Patient left: in chair;with call bell/phone within reach;with chair alarm set   PT Visit Diagnosis: Difficulty in walking, not elsewhere classified (R26.2);Hemiplegia and hemiparesis Hemiplegia - Right/Left: Left Hemiplegia - dominant/non-dominant:  Non-dominant Hemiplegia - caused  by: Cerebral infarction    Time: 1460-4799 PT Time Calculation (min) (ACUTE ONLY): 19 min   Charges:      Wells Guiles B. Everado Pillsbury, PT, DPT [phone]    PT Evaluation $PT Eval Moderate Complexity: 1 Procedure     03/08/2017, 4:59 PM

## 2017-03-08 NOTE — Progress Notes (Signed)
Rehab Admissions Coordinator Note:  Patient was screened by Cleatrice Burke for appropriateness for an Inpatient Acute Rehab Consult per OT recommendations.  At this time, we are recommending Inpatient Rehab consult.  Cleatrice Burke 03/08/2017, 2:59 PM  I can be reached at [phone].

## 2017-03-08 NOTE — Consult Note (Signed)
Physical Medicine and Rehabilitation Consult   Reason for Consult: Recurrent stroke with left sided weakness affecting mobility and self care tasks Referring Physician: Dr. Wynelle Cleveland.    HPI: Christy Sutton is a 83 y.o. female with history of DM diet controlled, HTN,  CVA 4/18 and 5/18, A fib on eliquis who was admitted on 03/07/2017 with worsening of left sided weakness with numbness, left facial droop and concerns of stroke extension. MRI brain reviewed, showing right CVA.  Per report, restricted diffusion in right posterior limb internal capsule and thalamus consistent with acute infarct adjacent to area of prior ischemia and no stenosis or occlusion.  Dr. Leonie Man felt that stroke embolic likely due to A fib and recommended changing Eliquis to Pradaxa. Patient with resultant left sided weakness, cognitive deficits with no safety awareness and hard to redirect. CIR recommended for for follow up therapy.     Review of Systems  HENT: Positive for hearing loss. Negative for tinnitus.   Eyes: Negative for blurred vision and double vision.  Respiratory: Negative for cough and shortness of breath.   Cardiovascular: Negative for chest pain and palpitations.  Gastrointestinal: Negative for abdominal pain and heartburn.  Genitourinary: Negative for dysuria.  Musculoskeletal: Positive for joint pain (bilateral knee with instability).  Neurological: Positive for focal weakness and weakness. Negative for dizziness, tingling and headaches.  Psychiatric/Behavioral: Negative for memory loss.  All other systems reviewed and are negative.   Past Medical History:  Diagnosis Date  . Atrial fibrillation ([place])   . Diabetes mellitus without complication ([place])   . Hyperlipidemia   . Hypertension   . TIA (transient ischemic attack)     Past Surgical History:  Procedure Laterality Date  . ABDOMINAL HYSTERECTOMY    . TEE WITHOUT CARDIOVERSION N/A 01/30/2017   Procedure: TRANSESOPHAGEAL ECHOCARDIOGRAM  (TEE);  Surgeon: Adrian Prows, MD;  Location: [facility] ENDOSCOPY;  Service: Cardiovascular;  Laterality: N/A;  . TUBAL LIGATION      Family History  Problem Relation Age of Onset  . Heart attack Father     Social History:  Reports that grandson lives with her and she was independent PTA. Was back to doing yardwork. Family lives in neighborhood and check in on her. She reports that she has never smoked. She has never used smokeless tobacco. She reports that she does not drink alcohol or use drugs.    Allergies  Allergen Reactions  . Penicillins Hives    Has patient had a PCN reaction causing immediate rash, facial/tongue/throat swelling, SOB or lightheadedness with hypotension: Yes Has patient had a PCN reaction causing severe rash involving mucus membranes or skin necrosis: Unknown Has patient had a PCN reaction that required hospitalization: Unknown Has patient had a PCN reaction occurring within the last 10 years: No If all of the above answers are "NO", then may proceed with Cephalosporin use.     Medications Prior to Admission  Medication Sig Dispense Refill  . aspirin 81 MG chewable tablet Chew 81 mg by mouth every evening.     Marland Kitchen atenolol (TENORMIN) 25 MG tablet Take 25 mg by mouth daily.     Marland Kitchen CRANBERRY PO Take 1 tablet by mouth daily. **CHEW**    . ELIQUIS 5 MG TABS tablet Take 5 mg by mouth 2 (two) times daily.    Marland Kitchen lisinopril (PRINIVIL,ZESTRIL) 5 MG tablet Take 5 mg by mouth daily.     . meclizine (ANTIVERT) 25 MG tablet Take 25 mg by mouth 2 (two) times  daily as needed for dizziness.    . metFORMIN (GLUCOPHAGE) 500 MG tablet Take 500 mg by mouth daily after breakfast.    . simvastatin (ZOCOR) 40 MG tablet Take 40 mg by mouth daily.    Marland Kitchen atorvastatin (LIPITOR) 20 MG tablet Take 1 tablet (20 mg total) by mouth daily at 6 PM. (Patient not taking: Reported on 03/07/2017) 30 tablet 0    Home: Home Living Family/patient expects to be discharged to:: Private residence Living  Arrangements: Children Available Help at Discharge: Family, Industrial/product designer, Available 24 hours/day Type of Home: House Home Access: Stairs to enter Technical brewer of Steps: 6 Entrance Stairs-Rails: Right Home Layout: One level Bathroom Shower/Tub: Multimedia programmer: Standard Bathroom Accessibility: Yes Home Equipment: Civil engineer, contracting, Environmental consultant - 2 wheels  Functional History: Prior Function Level of Independence: Independent with assistive device(s) Comments: pt ambulates with use of RW Functional Status:  Mobility: Bed Mobility General bed mobility comments: pt sitting OOB in recliner chair when therapist entered room Transfers Overall transfer level: Needs assistance Equipment used: Rolling walker (2 wheeled) Transfers: Sit to/from Stand Sit to Stand: +2 physical assistance, +2 safety/equipment, Mod assist General transfer comment: block LLE, and assist to support trunk during boost      ADL: ADL Overall ADL's : Needs assistance/impaired Eating/Feeding: Minimal assistance, Sitting Eating/Feeding Details (indicate cue type and reason): Pt will; need assist for Bil tasks like cutting Grooming: Minimal assistance, Sitting Grooming Details (indicate cue type and reason): assist for bilateral hand tasks and sequencing multi-step Upper Body Bathing: Minimal assistance, Sitting Lower Body Bathing: Moderate assistance, Sitting/lateral leans Upper Body Dressing : Minimal assistance, Sitting Lower Body Dressing: Moderate assistance, +2 for physical assistance, +2 for safety/equipment, Sit to/from stand Toilet Transfer: Moderate assistance, +2 for physical assistance, +2 for safety/equipment, Stand-pivot, BSC, RW Toilet Transfer Details (indicate cue type and reason): vc for safety, assist with weight shift and stability of LLE during transfer Toileting- Clothing Manipulation and Hygiene: Maximal assistance, Sit to/from stand Functional mobility during ADLs: Moderate  assistance, +2 for physical assistance, +2 for safety/equipment, Rolling walker, Cueing for safety, Cueing for sequencing  Cognition: Cognition Overall Cognitive Status: Impaired/Different from baseline Orientation Level: Oriented X4 Cognition Arousal/Alertness: Awake/alert Behavior During Therapy: WFL for tasks assessed/performed Overall Cognitive Status: Impaired/Different from baseline Area of Impairment: Following commands, Safety/judgement, Problem solving, Attention Current Attention Level: Selective Memory: Decreased short-term memory Following Commands: Follows one step commands with increased time Safety/Judgement: Decreased awareness of deficits, Decreased awareness of safety Problem Solving: Requires verbal cues, Requires tactile cues General Comments: Pt very easily distracted, tangential in conversation, attempts to pick up RW during ambulation (no safety awarenes) and require verbal and tactile cues. Pt also perseverated on mowing her grass and being out in the yard.   Blood pressure (!) 146/77, pulse 79, temperature 98.8 F (37.1 C), temperature source Oral, resp. rate 20, height 5\' 4"  (1.626 m), weight 64.9 kg (143 lb), SpO2 95 %. Physical Exam  Nursing note and vitals reviewed. Constitutional: She is oriented to person, place, and time. She appears well-developed and well-nourished. No distress.  HENT:  Head: Normocephalic and atraumatic.  Eyes: Conjunctivae and EOM are normal. Pupils are equal, round, and reactive to light.  Neck: Normal range of motion. Neck supple.  Cardiovascular: Normal rate and regular rhythm.   Respiratory: Effort normal and breath sounds normal. No stridor. No respiratory distress.  GI: Soft. Bowel sounds are normal. She exhibits no distension.  Musculoskeletal: She exhibits no edema or tenderness.  Neurological: She is alert and oriented to person, place, and time.  HOH.  Minimal dysarthria.  Able to state date and Pres.  Able to follow  simple one and two step motor commands. Lacks insight/awareness of deficits.  Motor: RUE/RLE: 5/5 proximal to distal LUE: 4+/5 with apraxia LLE: HF 4/5, KE 3-/5, ADF/PF 2-/5 DTRs symmetric   Skin: Skin is warm and dry. She is not diaphoretic.  Psychiatric: She has a normal mood and affect. Her speech is normal and behavior is normal. Thought content normal. She expresses inappropriate judgment.    Results for orders placed or performed during the hospital encounter of 03/07/17 (from the past 24 hour(s))  Glucose, capillary     Status: Abnormal   Collection Time: 03/07/17 10:28 PM  Result Value Ref Range   Glucose-Capillary 129 (H) 65 - 99 mg/dL  Glucose, capillary     Status: Abnormal   Collection Time: 03/08/17  6:38 AM  Result Value Ref Range   Glucose-Capillary 111 (H) 65 - 99 mg/dL   Comment 1 Notify RN    Comment 2 Document in Chart   Glucose, capillary     Status: Abnormal   Collection Time: 03/08/17 11:30 AM  Result Value Ref Range   Glucose-Capillary 119 (H) 65 - 99 mg/dL   Comment 1 Notify RN    Comment 2 Document in Chart    Mr Jodene Nam Head Wo Contrast  Result Date: 03/07/2017 CLINICAL DATA:  Worsening LEFT-sided weakness, beginning earlier today. EXAM: MRI HEAD WITHOUT CONTRAST MRA HEAD WITHOUT CONTRAST TECHNIQUE: Multiplanar, multiecho pulse sequences of the brain and surrounding structures were obtained without intravenous contrast. Angiographic images of the head were obtained using MRA technique without contrast. COMPARISON:  CT head 03/07/2017.  MR head 01/17/2017. FINDINGS: MRI HEAD FINDINGS Brain: Ovoid 1 cm area of restricted diffusion, corresponding low ADC, affects the posterior limb internal capsule and thalamus on the RIGHT, consistent with acute infarction. This is slightly lateral to the acute infarct demonstrated in May. There is extension cephalad to the periventricular white matter. No hemorrhage, mass lesion, or extra-axial fluid. Global atrophy with  hydrocephalus ex vacuo. Mild white matter disease. Mineralization of not only the basal ganglia but the lateral thalamus and adjacent white matter, representing an area of chronic ischemia. Vascular: Normal flow voids. Skull and upper cervical spine: Normal marrow signal. Sinuses/Orbits: Negative. Other: None. MRA HEAD FINDINGS There is slight non stenotic irregularity of the cavernous and supraclinoid carotid arteries, without measurable stenosis. There is wide patency of basilar artery, and both vertebral arteries. No proximal stenosis of the anterior or middle cerebral arteries. No proximal PCA stenosis. Moderate irregularity in the RIGHT greater than LEFT P2 and P3 segments. No MCA branch irregularity or occlusion. No saccular aneurysm. IMPRESSION: New area of acute cerebral infarction involving the RIGHT lateral thalamus and posterior limb internal capsule, adjacent to the area of previous ischemia on the Jan 17, 2017 scan. Chronic intracranial changes as described. No proximal vascular stenosis or occlusion. Electronically Signed   By: Staci Righter M.D.   On: 03/07/2017 19:00   Mr Brain Wo Contrast  Result Date: 03/07/2017 CLINICAL DATA:  Worsening LEFT-sided weakness, beginning earlier today. EXAM: MRI HEAD WITHOUT CONTRAST MRA HEAD WITHOUT CONTRAST TECHNIQUE: Multiplanar, multiecho pulse sequences of the brain and surrounding structures were obtained without intravenous contrast. Angiographic images of the head were obtained using MRA technique without contrast. COMPARISON:  CT head 03/07/2017.  MR head 01/17/2017. FINDINGS: MRI HEAD FINDINGS Brain: Ovoid 1 cm  area of restricted diffusion, corresponding low ADC, affects the posterior limb internal capsule and thalamus on the RIGHT, consistent with acute infarction. This is slightly lateral to the acute infarct demonstrated in May. There is extension cephalad to the periventricular white matter. No hemorrhage, mass lesion, or extra-axial fluid. Global  atrophy with hydrocephalus ex vacuo. Mild white matter disease. Mineralization of not only the basal ganglia but the lateral thalamus and adjacent white matter, representing an area of chronic ischemia. Vascular: Normal flow voids. Skull and upper cervical spine: Normal marrow signal. Sinuses/Orbits: Negative. Other: None. MRA HEAD FINDINGS There is slight non stenotic irregularity of the cavernous and supraclinoid carotid arteries, without measurable stenosis. There is wide patency of basilar artery, and both vertebral arteries. No proximal stenosis of the anterior or middle cerebral arteries. No proximal PCA stenosis. Moderate irregularity in the RIGHT greater than LEFT P2 and P3 segments. No MCA branch irregularity or occlusion. No saccular aneurysm. IMPRESSION: New area of acute cerebral infarction involving the RIGHT lateral thalamus and posterior limb internal capsule, adjacent to the area of previous ischemia on the Jan 17, 2017 scan. Chronic intracranial changes as described. No proximal vascular stenosis or occlusion. Electronically Signed   By: Staci Righter M.D.   On: 03/07/2017 19:00   Ct Head Code Stroke W/o Cm  Result Date: 03/07/2017 CLINICAL DATA:  Code stroke.  Left-sided weakness.  Recent stroke EXAM: CT HEAD WITHOUT CONTRAST TECHNIQUE: Contiguous axial images were obtained from the base of the skull through the vertex without intravenous contrast. COMPARISON:  MRI 01/17/2017, CT 01/05/2017 FINDINGS: Brain: Moderate atrophy. Physiologic calcification in the globus pallidus bilaterally. Asymmetric calcification right posterior and lateral thalamus unchanged from prior studies and appears chronic. Recent MRI demonstrated a small acute infarct in the posterior limb internal capsule. Negative for acute infarct, hemorrhage, or mass lesion. Vascular: Negative for hyperdense vessel Skull: Negative for skull fracture. Erosion of the dens with posterior pannus formation suggesting rheumatoid arthritis.  Sinuses/Orbits: Air-fluid levels in the sphenoid sinus. Mucosal edema in the ethmoid sinuses. Bilateral lens replacement. Other: None ASPECTS ([place] Stroke Program Early CT Score) - Ganglionic level infarction (caudate, lentiform nuclei, internal capsule, insula, M1-M3 cortex): 7 - Supraganglionic infarction (M4-M6 cortex): 3 Total score (0-10 with 10 being normal): 10 IMPRESSION: 1. No acute intracranial abnormality 2. ASPECTS is 10 Electronically Signed   By: Franchot Gallo M.D.   On: 03/07/2017 14:06    Assessment/Plan: Diagnosis: Right CVA Labs and images independently reviewed.  Records reviewed and summated above. Stroke: Continue secondary stroke prophylaxis and Risk Factor Modification listed below:   Antiplatelet therapy:   Blood Pressure Management:  Continue current medication with prn's with permisive HTN per primary team Statin Agent:   Prediabetes management:   Left sided hemiparesis: fit for orthosis to prevent contractures  Motor recovery: Fluoxetine  1. Does the need for close, 24 hr/day medical supervision in concert with the patient's rehab needs make it unreasonable for this patient to be served in a less intensive setting? Yes  2. Co-Morbidities requiring supervision/potential complications: DM (Monitor in accordance with exercise and adjust meds as necessary), HTN (monitor and provide prns in accordance with increased physical exertion and pain), CVA 4/18 and 5/18, A fib (cont meds, monitor HR with increased mobility), ABLA (transfuse if necessary to ensure appropriate perfusion for increased activity tolerance) 3. Due to safety, disease management and patient education, does the patient require 24 hr/day rehab nursing? Yes 4. Does the patient require coordinated care of a physician,  rehab nurse, PT (1-2 hrs/day, 5 days/week) and OT (1-2 hrs/day, 5 days/week) to address physical and functional deficits in the context of the above medical diagnosis(es)? Yes Addressing  deficits in the following areas: balance, endurance, locomotion, strength, transferring, bathing, dressing, toileting and psychosocial support 5. Can the patient actively participate in an intensive therapy program of at least 3 hrs of therapy per day at least 5 days per week? Yes 6. The potential for patient to make measurable gains while on inpatient rehab is excellent 7. Anticipated functional outcomes upon discharge from inpatient rehab are supervision and min assist  with PT, supervision and min assist with OT, n/a with SLP. 8. Estimated rehab length of stay to reach the above functional goals is: 14-17 days. 9. Anticipated D/C setting: Home 10. Anticipated post D/C treatments: HH therapy and Home excercise program 11. Overall Rehab/Functional Prognosis: good  RECOMMENDATIONS: This patient's condition is appropriate for continued rehabilitative care in the following setting: CIR Patient has agreed to participate in recommended program. Yes Note that insurance prior authorization may be required for reimbursement for recommended care.  Comment: Rehab Admissions Coordinator to follow up.  Delice Lesch, MD, [address], [place] 03/08/2017

## 2017-03-08 NOTE — Evaluation (Signed)
Occupational Therapy Evaluation Patient Details Name: Christy Sutton MRN: [id number] DOB: Feb 21, 1926 Today's Date: 03/08/2017    History of Present Illness Pt is a very pleasant very HOH 83 y.o. female with medical history significant stroke, diabetes, A. fib, hypertension workup this morning noting worsening left-sided weakness. She's had 2 prior strokes in April and May of this year and A. Fib. CT of the head was obtained showed no acute abnormality. MRI revealed acute cerebral infarction involving the RIGHT lateral thalamus and posterior limb internal capsule.   Clinical Impression   PTA Pt independent in ADL and used RW for mobility. Pt very active and enjoys being out in the yard- mowing grass etc. Pt currently mod A for ADL and mod A +2 for mobility with chair follow for safety. Pt is very motivated and pleasant during session. Please see below for further information about LUE deficits. Pt will require skilled OT in the acute setting and CIR level therapy to maximize safety and independence in ADL/functional transfers to return to PLOF. Pt will be able to tolerate intense therapy and has lots of zest for life (very motivated). Next session to establish HEP for LUE, and continue functional ADL therapy.     Follow Up Recommendations  CIR;Supervision/Assistance - 24 hour    Equipment Recommendations  Other (comment) (defer to next venue)    Recommendations for Other Services Rehab consult     Precautions / Restrictions Precautions Precautions: Fall Restrictions Weight Bearing Restrictions: No      Mobility Bed Mobility               General bed mobility comments: pt sitting OOB in recliner chair when therapist entered room  Transfers Overall transfer level: Needs assistance Equipment used: Rolling walker (2 wheeled) Transfers: Sit to/from Stand Sit to Stand: +2 physical assistance;+2 safety/equipment;Mod assist         General transfer comment: block LLE, and assist  to support trunk during boost    Balance Overall balance assessment: Needs assistance Sitting-balance support: Single extremity supported;Feet supported Sitting balance-Leahy Scale: Poor     Standing balance support: Bilateral upper extremity supported;During functional activity Standing balance-Leahy Scale: Zero Standing balance comment: Pt reliant on BUE and external support from therapists to remain upright                           ADL either performed or assessed with clinical judgement   ADL Overall ADL's : Needs assistance/impaired Eating/Feeding: Minimal assistance;Sitting Eating/Feeding Details (indicate cue type and reason): Pt will; need assist for Bil tasks like cutting Grooming: Minimal assistance;Sitting Grooming Details (indicate cue type and reason): assist for bilateral hand tasks and sequencing multi-step Upper Body Bathing: Minimal assistance;Sitting   Lower Body Bathing: Moderate assistance;Sitting/lateral leans   Upper Body Dressing : Minimal assistance;Sitting   Lower Body Dressing: Moderate assistance;+2 for physical assistance;+2 for safety/equipment;Sit to/from stand   Toilet Transfer: Moderate assistance;+2 for physical assistance;+2 for safety/equipment;Stand-pivot;BSC;RW Toilet Transfer Details (indicate cue type and reason): vc for safety, assist with weight shift and stability of LLE during transfer Toileting- Clothing Manipulation and Hygiene: Maximal assistance;Sit to/from stand       Functional mobility during ADLs: Moderate assistance;+2 for physical assistance;+2 for safety/equipment;Rolling walker;Cueing for safety;Cueing for sequencing       Vision Baseline Vision/History: No visual deficits Patient Visual Report: No change from baseline Vision Assessment?: No apparent visual deficits     Perception     Praxis  Pertinent Vitals/Pain Pain Assessment: No/denies pain     Hand Dominance Right   Extremity/Trunk  Assessment Upper Extremity Assessment Upper Extremity Assessment: LUE deficits/detail LUE Deficits / Details: sensation grossly intact; grasp is 3/5, able to extend digits with increased time, elbow flexion 3/5, shoulder flexion to 75 degrees but unable to maintain LUE Coordination: decreased fine motor;decreased gross motor   Lower Extremity Assessment Lower Extremity Assessment: Defer to PT evaluation   Cervical / Trunk Assessment Cervical / Trunk Assessment: Normal (slightly rounded head and shoulders)   Communication Communication Communication: HOH   Cognition Arousal/Alertness: Awake/alert Behavior During Therapy: WFL for tasks assessed/performed Overall Cognitive Status: Impaired/Different from baseline Area of Impairment: Following commands;Safety/judgement;Problem solving;Attention                   Current Attention Level: Selective Memory: Decreased short-term memory Following Commands: Follows one step commands with increased time Safety/Judgement: Decreased awareness of deficits;Decreased awareness of safety   Problem Solving: Requires verbal cues;Requires tactile cues General Comments: Pt very easily distracted, tangential in conversation, attempts to pick up RW during ambulation (no safety awarenes) and require verbal and tactile cues. Pt also perseverated on mowing her grass and being out in the yard.   General Comments  Granddaughter came in at the end of session and confirmed home information    Exercises     Shoulder Instructions      Home Living Family/patient expects to be discharged to:: Private residence Living Arrangements: Children Available Help at Discharge: Family;Neighbor;Available 24 hours/day Type of Home: House Home Access: Stairs to enter [employer] of Steps: 6 Entrance Stairs-Rails: Right Home Layout: One level     Bathroom Shower/Tub: Occupational psychologist: Standard Bathroom Accessibility: Yes How  Accessible: Accessible via walker Home Equipment: Shower seat;Walker - 2 wheels          Prior Functioning/Environment Level of Independence: Independent with assistive device(s)        Comments: pt ambulates with use of RW        OT Problem List: Decreased strength;Decreased range of motion;Decreased activity tolerance;Impaired balance (sitting and/or standing);Decreased coordination;Decreased cognition;Decreased safety awareness;Decreased knowledge of use of DME or AE;Impaired UE functional use      OT Treatment/Interventions: Self-care/ADL training;Therapeutic exercise;Neuromuscular education;DME and/or AE instruction;Therapeutic activities;Cognitive remediation/compensation;Patient/family education;Balance training    OT Goals(Current goals can be found in the care plan section) Acute Rehab OT Goals Patient Stated Goal: get back to mowing the lawn OT Goal Formulation: With patient Time For Goal Achievement: 03/22/17 Potential to Achieve Goals: Good ADL Goals Pt Will Perform Eating: with set-up;sitting Pt Will Perform Grooming: sitting;with set-up Pt Will Perform Upper Body Bathing: with supervision;sitting Pt Will Perform Lower Body Bathing: with supervision;sitting/lateral leans Pt Will Transfer to Toilet: with min guard assist;stand pivot transfer;bedside commode (with caregiver independent in assisting; with RW) Pt Will Perform Toileting - Clothing Manipulation and hygiene: with supervision;sit to/from stand Pt/caregiver will Perform Home Exercise Program: Left upper extremity;With written HEP provided;Independently  OT Frequency: Min 3X/week   Barriers to D/C:            Co-evaluation PT/OT/SLP Co-Evaluation/Treatment: Yes Reason for Co-Treatment: For patient/therapist safety;To address functional/ADL transfers   OT goals addressed during session: ADL's and self-care      AM-PAC PT "6 Clicks" Daily Activity     Outcome Measure Help from another person eating  meals?: A Little Help from another person taking care of personal grooming?: A Little Help from another person toileting,  which includes using toliet, bedpan, or urinal?: A Lot Help from another person bathing (including washing, rinsing, drying)?: A Lot Help from another person to put on and taking off regular upper body clothing?: A Lot Help from another person to put on and taking off regular lower body clothing?: A Lot 6 Click Score: 14   End of Session Equipment Utilized During Treatment: Gait belt;Rolling walker Nurse Communication: Mobility status  Activity Tolerance: Patient tolerated treatment well Patient left: in chair;with call bell/phone within reach;with chair alarm set;with family/visitor present  OT Visit Diagnosis: Other abnormalities of gait and mobility (R26.89);History of falling (Z91.81);Other symptoms and signs involving the nervous system (R29.898);Hemiplegia and hemiparesis;Other symptoms and signs involving cognitive function Hemiplegia - Right/Left: Left Hemiplegia - dominant/non-dominant: Non-Dominant Hemiplegia - caused by: Cerebral infarction                Time: 1328-1406 OT Time Calculation (min): 38 min Charges:  OT General Charges $OT Visit: 1 Procedure OT Evaluation $OT Eval Moderate Complexity: 1 Procedure OT Treatments $Self Care/Home Management : 8-22 mins G-Codes:     Hulda Humphrey OTR/L [place] 03/08/2017, 2:43 PM

## 2017-03-08 NOTE — Progress Notes (Signed)
STROKE TEAM PROGRESS NOTE   HISTORY OF PRESENT ILLNESS (per record) Christy Sutton is an 83 y.o. female who woke up the morning of 03/07/2017 at her baseline. She has history of 2 prior strokes in April as well as May 2018 from atrial fibrillation despite being on eliquis. The patient apparently ate breakfast 03/07/2017 morning and was fine but at around 9 AM noticed sudden onset of left-sided weakness and incoordination. She did not call for help immediately and waited to see if symptoms improve. Later on as she did not feel better, and EMS were called and code stroke was activated en route. Upon arrival patient was met by the stroke team and noted to have left arm clumsiness and left leg weakness. CT scan of the head was obtained emergently which showed new area of acute cerebral infarction involving the RIGHT lateral thalamus and posterior limb internal capsule, adjacent to the area of previous ischemia on the Jan 17, 2017 scan. Patient's NIH stroke scale on admission was felt to be 3 by RN but by Dr. Clydene Fake documentation it was 4.  Patient states she has taken her dose of eliquis the morning of 03/07/2017. Review of patient's medical records revealed that she had a right posterior limb internal capsule infarct adjacent to the calcific area in the right parietal white matter during the last admission a month ago. Eliquis was continued. She made good physical recovery and was able to life at home with close supervision by her family. LSN : 9 am 03/07/17 NIHSS 4  Patient was not administered IV t-PA secondary to anticoagulation with eliquis. She was admitted to General Neurology for further evaluation and treatment.   SUBJECTIVE (INTERVAL HISTORY) The patient is awake and alert. Her neurological deficits appear unchanged The patient to will be switched from Eliquis to Pradaxa given her stroke while on Eliquis.  OBJECTIVE Temp:  [98.1 F (36.7 C)-98.8 F (37.1 C)] 98.8 F (37.1 C) (06/28 1520) Pulse Rate:   [75-105] 79 (06/28 1520) Cardiac Rhythm: Atrial fibrillation (06/28 0700) Resp:  [16-20] 20 (06/28 1520) BP: (119-165)/(56-84) 146/77 (06/28 1520) SpO2:  [92 %-96 %] 95 % (06/28 1520)  CBC:  Recent Labs Lab 03/07/17 1344 03/07/17 1350  WBC 6.9  --   NEUTROABS 5.1  --   HGB 11.2* 10.2*  HCT 33.1* 30.0*  MCV 89.5  --   PLT 157  --     Basic Metabolic Panel:  Recent Labs Lab 03/07/17 1344 03/07/17 1350  NA 140 142  K 3.7 3.7  CL 109 107  CO2 26  --   GLUCOSE 133* 130*  BUN 7 9  CREATININE 0.74 0.70  CALCIUM 9.1  --     Lipid Panel:    Component Value Date/Time   CHOL 146 01/18/2017 0756   TRIG 75 01/18/2017 0756   HDL 68 01/18/2017 0756   CHOLHDL 2.1 01/18/2017 0756   VLDL 15 01/18/2017 0756   LDLCALC 63 01/18/2017 0756   HgbA1c:  Lab Results  Component Value Date   HGBA1C 6.0 (H) 01/18/2017   Urine Drug Screen: No results found for: LABOPIA, COCAINSCRNUR, LABBENZ, AMPHETMU, THCU, LABBARB  Alcohol Level No results found for: [facility]  IMAGING  Mr [facility] Contrast Mr Brain Wo Contrast 03/07/2017 IMPRESSION: New area of acute cerebral infarction involving the RIGHT lateral thalamus and posterior limb internal capsule, adjacent to the area of previous ischemia on the Jan 17, 2017 scan. Chronic intracranial changes as described. No proximal vascular stenosis or occlusion.  Ct Head Code Stroke W/o Cm 03/07/2017 IMPRESSION: 1. No acute intracranial abnormality 2. ASPECTS is 10      PHYSICAL EXAM frail elderly Caucasian lady currently not in distress. . Afebrile. Head is nontraumatic. Neck is supple without bruit.    Cardiac exam no murmur or gallop. Lungs are clear to auscultation. Distal pulses are well felt. Neurological Exam :  Awake alert oriented 3 with normal speech and language function. No dysarthria or aphasia. She follows two-step commands well. Extraocular movements are full range without nystagmus. She blinks to threat bilaterally. Face is  symmetric. Tongue is midline. Motor system exam revealed no upper extremity drift but significant ataxia and left upper extremity. Left lower extremity drift with ligating the ground. Impaired coordination on the left. Touch pinprick sensation seems preserved bilaterally. Plantars are downgoing. Deep tendon results in a symmetric. Gait was not tested.  -NIH Stroke scale score 4  ASSESSMENT/PLA [name] is a 83 y.o. female with history of atrial fibrillation, type II diabetes mellitus, hyperlipidemia, hypertension, and two previous strokes while on Eliquis presenting with  left-sided weakness and incoordination. She did not receive IV t-PA due to being on Eliquis for anticoagulation.   Stroke:  New area of acute cerebral infarction involving the RIGHT lateral thalamus and posterior limb internal capsule, adjacent to the area of previous ischemia, likely cardioembolic secondary to thrombus in appendage apex  Resultant  Left hemiparesis and ataxia  CT head: No stroke  MRI head: New area of acute cerebral infarction involving the RIGHT lateral thalamus and posterior limb internal capsule, adjacent to the area of previous ischemia on the Jan 17, 2017 scan.  MRA head : No proximal vascular stenosis or occlusion.  CT Angio Head/Neck (01/17/2017): No large vessel occlusion.  Diffuse atherosclerosis without significant focal stenosis.  2D Echo (01/17/2017): Likely thrombus in the appendage apex with severely reduced LV emptying velocities    LDL 63 (01/18/2017)  HgbA1c 6 (01/18/2017)  SCDs for VTE prophylaxis  Diet heart healthy/carb modified Room service appropriate? Yes; Fluid consistency: Thin  Eliquis (apixaban) daily prior to admission, now on aspirin 81 mg daily and Eliquis (apixaban) daily  Patient counseled to be compliant with her antithrombotic medications  Ongoing aggressive stroke risk factor management  Therapy recommendations:  CIR  Disposition:  Pending    Hypertension  Stable Permissive hypertension (OK if < 220/120) but gradually normalize in 5-7 days Long-term BP goal normotensive  Hyperlipidemia  Home meds: simvastatin 4m PO daily, now on atorvastatin 221mPO daily  LDL 63, goal < 70  Continue statin at discharge  Diabetes  HgbA1c 6, goal < 7.0  Controlled  Other Stroke Risk Factors  Advanced age  Overweight, Body mass index is 24.55 kg/m., recommend weight loss, diet and exercise as appropriate   Hx stroke/TIA  Other Active Problems  none  Hospital day # 1  I have personally examined this patient, reviewed notes, independently viewed imaging studies, participated in medical decision making and plan of care.ROS completed by me personally and pertinent positives fully documented  I have made any additions or clarifications directly to the above note. She has presented with right thalamic infarct secondary to atrial fibrillation despite NT correlation with a liquids. Recommend switch and liquids to protect the. Physical occupational therapy and rehabilitation consults. Discussed with Dr. RiWynelle Cleveland Greater than 50% time during this 25 minute visit was spent on counseling and coordination of care about her left hemiplegia and embolic strokes and discussion about treatment options for  stroke prevention. Antony Contras, MD Medical Director [place] Pager: [phone] 03/08/2017 9:23 PM   To contact Stroke Continuity provider, please refer to [email or web address] After hours, contact General Neurology

## 2017-03-09 ENCOUNTER — Inpatient Hospital Stay (HOSPITAL_COMMUNITY)
Admission: RE | Admit: 2017-03-09 | Discharge: 2017-03-31 | DRG: 057 | Disposition: A | Discharge status: Home Health | Payer: Medicare Other | Source: Intra-hospital | Attending: Physical Medicine & Rehabilitation | Admitting: Physical Medicine & Rehabilitation

## 2017-03-09 DIAGNOSIS — G8194 Hemiplegia, unspecified affecting left nondominant side: Secondary | ICD-10-CM | POA: Diagnosis not present

## 2017-03-09 DIAGNOSIS — E119 Type 2 diabetes mellitus without complications: Secondary | ICD-10-CM | POA: Diagnosis present

## 2017-03-09 DIAGNOSIS — Z79899 Other long term (current) drug therapy: Secondary | ICD-10-CM | POA: Diagnosis not present

## 2017-03-09 DIAGNOSIS — I63119 Cerebral infarction due to embolism of unspecified vertebral artery: Secondary | ICD-10-CM | POA: Diagnosis not present

## 2017-03-09 DIAGNOSIS — D649 Anemia, unspecified: Secondary | ICD-10-CM | POA: Diagnosis present

## 2017-03-09 DIAGNOSIS — Z7982 Long term (current) use of aspirin: Secondary | ICD-10-CM | POA: Diagnosis not present

## 2017-03-09 DIAGNOSIS — Z880 Allergy status to penicillin: Secondary | ICD-10-CM | POA: Diagnosis not present

## 2017-03-09 DIAGNOSIS — E46 Unspecified protein-calorie malnutrition: Secondary | ICD-10-CM | POA: Diagnosis present

## 2017-03-09 DIAGNOSIS — Z8673 Personal history of transient ischemic attack (TIA), and cerebral infarction without residual deficits: Secondary | ICD-10-CM

## 2017-03-09 DIAGNOSIS — Z90710 Acquired absence of both cervix and uterus: Secondary | ICD-10-CM

## 2017-03-09 DIAGNOSIS — R531 Weakness: Secondary | ICD-10-CM

## 2017-03-09 DIAGNOSIS — I69354 Hemiplegia and hemiparesis following cerebral infarction affecting left non-dominant side: Secondary | ICD-10-CM | POA: Diagnosis not present

## 2017-03-09 DIAGNOSIS — R4189 Other symptoms and signs involving cognitive functions and awareness: Secondary | ICD-10-CM | POA: Diagnosis present

## 2017-03-09 DIAGNOSIS — Z7984 Long term (current) use of oral hypoglycemic drugs: Secondary | ICD-10-CM

## 2017-03-09 DIAGNOSIS — I6310 Cerebral infarction due to embolism of unspecified precerebral artery: Secondary | ICD-10-CM

## 2017-03-09 DIAGNOSIS — I482 Chronic atrial fibrillation: Secondary | ICD-10-CM | POA: Diagnosis present

## 2017-03-09 DIAGNOSIS — I639 Cerebral infarction, unspecified: Secondary | ICD-10-CM | POA: Diagnosis present

## 2017-03-09 DIAGNOSIS — Z7901 Long term (current) use of anticoagulants: Secondary | ICD-10-CM | POA: Diagnosis not present

## 2017-03-09 DIAGNOSIS — I63411 Cerebral infarction due to embolism of right middle cerebral artery: Secondary | ICD-10-CM | POA: Diagnosis not present

## 2017-03-09 DIAGNOSIS — D62 Acute posthemorrhagic anemia: Secondary | ICD-10-CM

## 2017-03-09 DIAGNOSIS — E876 Hypokalemia: Secondary | ICD-10-CM | POA: Diagnosis present

## 2017-03-09 DIAGNOSIS — I6349 Cerebral infarction due to embolism of other cerebral artery: Secondary | ICD-10-CM | POA: Diagnosis not present

## 2017-03-09 DIAGNOSIS — I6340 Cerebral infarction due to embolism of unspecified cerebral artery: Secondary | ICD-10-CM | POA: Diagnosis not present

## 2017-03-09 DIAGNOSIS — J9811 Atelectasis: Secondary | ICD-10-CM | POA: Diagnosis not present

## 2017-03-09 DIAGNOSIS — I4891 Unspecified atrial fibrillation: Secondary | ICD-10-CM | POA: Diagnosis present

## 2017-03-09 DIAGNOSIS — I10 Essential (primary) hypertension: Secondary | ICD-10-CM | POA: Diagnosis present

## 2017-03-09 DIAGNOSIS — I360 Nonrheumatic tricuspid (valve) stenosis: Secondary | ICD-10-CM | POA: Diagnosis not present

## 2017-03-09 DIAGNOSIS — E8809 Other disorders of plasma-protein metabolism, not elsewhere classified: Secondary | ICD-10-CM

## 2017-03-09 LAB — BASIC METABOLIC PANEL
ANION GAP: 8 (ref 5–15)
BUN: 6 mg/dL (ref 6–20)
CALCIUM: 8.4 mg/dL — AB (ref 8.9–10.3)
CHLORIDE: 108 mmol/L (ref 101–111)
CO2: 24 mmol/L (ref 22–32)
Creatinine, Ser: 0.7 mg/dL (ref 0.44–1.00)
GFR calc non Af Amer: >60 mL/min (ref >60)
GLUCOSE: 98 mg/dL (ref 65–99)
POTASSIUM: 3.6 mmol/L (ref 3.5–5.1)
Sodium: 140 mmol/L (ref 135–145)

## 2017-03-09 LAB — GLUCOSE, CAPILLARY
GLUCOSE-CAPILLARY: 141 mg/dL — AB (ref 65–99)
Glucose-Capillary: 97 mg/dL (ref 65–99)

## 2017-03-09 MED ORDER — ATORVASTATIN CALCIUM 20 MG PO TABS
20.0000 mg | ORAL_TABLET | Freq: Every day | ORAL | Status: DC
  Administered 2017-03-09 – 2017-03-30 (×22): 20 mg via ORAL
  Filled 2017-03-09 (×22): qty 1

## 2017-03-09 MED ORDER — DABIGATRAN ETEXILATE MESYLATE 150 MG PO CAPS: 150.0000 mg | ORAL_CAPSULE | Freq: Two times a day (BID) | ORAL | Status: DC

## 2017-03-09 MED ORDER — ASPIRIN 81 MG PO CHEW
81.0000 mg | CHEWABLE_TABLET | Freq: Every day | ORAL | Status: DC
  Administered 2017-03-09 – 2017-03-30 (×22): 81 mg via ORAL
  Filled 2017-03-09 (×23): qty 1

## 2017-03-09 MED ORDER — ALUM & MAG HYDROXIDE-SIMETH 200-200-20 MG/5ML PO SUSP: 30.0000 mL | ORAL | Status: DC | PRN

## 2017-03-09 MED ORDER — ACETAMINOPHEN 325 MG PO TABS: 325.0000 mg | ORAL_TABLET | ORAL | Status: DC | PRN

## 2017-03-09 MED ORDER — INSULIN ASPART 100 UNIT/ML ~~LOC~~ SOLN
0.0000 [IU] | Freq: Two times a day (BID) | SUBCUTANEOUS | Status: DC
  Administered 2017-03-10 – 2017-03-11 (×3): 1 [IU] via SUBCUTANEOUS
  Administered 2017-03-12: 3 [IU] via SUBCUTANEOUS
  Administered 2017-03-14: 1 [IU] via SUBCUTANEOUS
  Administered 2017-03-15 – 2017-03-17 (×2): 2 [IU] via SUBCUTANEOUS
  Administered 2017-03-18: 1 [IU] via SUBCUTANEOUS
  Administered 2017-03-20 – 2017-03-25 (×2): 2 [IU] via SUBCUTANEOUS
  Administered 2017-03-25: 1 [IU] via SUBCUTANEOUS
  Administered 2017-03-26 – 2017-03-28 (×2): 2 [IU] via SUBCUTANEOUS
  Administered 2017-03-29: 1 [IU] via SUBCUTANEOUS

## 2017-03-09 MED ORDER — SENNOSIDES-DOCUSATE SODIUM 8.6-50 MG PO TABS
1.0000 | ORAL_TABLET | Freq: Every evening | ORAL | Status: DC | PRN
  Administered 2017-03-23: 1 via ORAL
  Filled 2017-03-09: qty 1

## 2017-03-09 MED ORDER — TRAZODONE HCL 50 MG PO TABS: 25.0000 mg | ORAL_TABLET | Freq: Every evening | ORAL | Status: DC | PRN

## 2017-03-09 MED ORDER — FLEET ENEMA 7-19 GM/118ML RE ENEM: 1.0000 | ENEMA | Freq: Once | RECTAL | Status: DC | PRN

## 2017-03-09 MED ORDER — DIPHENHYDRAMINE HCL 12.5 MG/5ML PO ELIX
12.5000 mg | ORAL_SOLUTION | Freq: Four times a day (QID) | ORAL | Status: DC | PRN
  Filled 2017-03-09: qty 10

## 2017-03-09 MED ORDER — DABIGATRAN ETEXILATE MESYLATE 150 MG PO CAPS
150.0000 mg | ORAL_CAPSULE | Freq: Two times a day (BID) | ORAL | Status: DC
  Administered 2017-03-09 – 2017-03-31 (×44): 150 mg via ORAL
  Filled 2017-03-09 (×44): qty 1

## 2017-03-09 MED ORDER — PROCHLORPERAZINE MALEATE 5 MG PO TABS: 5.0000 mg | ORAL_TABLET | Freq: Four times a day (QID) | ORAL | Status: DC | PRN

## 2017-03-09 MED ORDER — PROCHLORPERAZINE EDISYLATE 5 MG/ML IJ SOLN: 5.0000 mg | Freq: Four times a day (QID) | INTRAMUSCULAR | Status: DC | PRN

## 2017-03-09 MED ORDER — PROCHLORPERAZINE 25 MG RE SUPP: 12.5000 mg | Freq: Four times a day (QID) | RECTAL | Status: DC | PRN

## 2017-03-09 MED ORDER — ATENOLOL 25 MG PO TABS
25.0000 mg | ORAL_TABLET | Freq: Every day | ORAL | Status: DC
  Administered 2017-03-10 – 2017-03-31 (×22): 25 mg via ORAL
  Filled 2017-03-09 (×22): qty 1

## 2017-03-09 MED ORDER — POLYETHYLENE GLYCOL 3350 17 G PO PACK
17.0000 g | PACK | Freq: Every day | ORAL | Status: DC | PRN
  Administered 2017-03-23: 17 g via ORAL
  Filled 2017-03-09: qty 1

## 2017-03-09 MED ORDER — BISACODYL 10 MG RE SUPP: 10.0000 mg | Freq: Every day | RECTAL | Status: DC | PRN

## 2017-03-09 MED ORDER — MECLIZINE HCL 25 MG PO TABS
25.0000 mg | ORAL_TABLET | Freq: Two times a day (BID) | ORAL | Status: DC | PRN
  Administered 2017-03-27: 25 mg via ORAL
  Filled 2017-03-09: qty 1

## 2017-03-09 MED ORDER — GUAIFENESIN-DM 100-10 MG/5ML PO SYRP: 5.0000 mL | ORAL_SOLUTION | Freq: Four times a day (QID) | ORAL | Status: DC | PRN

## 2017-03-09 NOTE — Progress Notes (Signed)
Christy Arn, MD Physician Signed Physical Medicine and Rehabilitation  Consult Note Date of Service: 03/08/2017 4:14 PM  Related encounter: ED to Hosp-Admission (Current) from 03/07/2017 in [facility] All Collapse All   [] Hide copied text [] Hover for attribution information      Physical Medicine and Rehabilitation Consult   Reason for Consult: Recurrent stroke with left sided weakness affecting mobility and self care tasks Referring Physician: Dr. Wynelle Cleveland.    HPI: Christy Sutton is a 83 y.o. female with history of DM diet controlled, HTN,  CVA 4/18 and 5/18, A fib on eliquis who was admitted on 03/07/2017 with worsening of left sided weakness with numbness, left facial droop and concerns of stroke extension. MRI brain reviewed, showing right CVA.  Per report, restricted diffusion in right posterior limb internal capsule and thalamus consistent with acute infarct adjacent to area of prior ischemia and no stenosis or occlusion.  Dr. Leonie Man felt that stroke embolic likely due to A fib and recommended changing Eliquis to Pradaxa. Patient with resultant left sided weakness, cognitive deficits with no safety awareness and hard to redirect. CIR recommended for for follow up therapy.     Review of Systems  HENT: Positive for hearing loss. Negative for tinnitus.   Eyes: Negative for blurred vision and double vision.  Respiratory: Negative for cough and shortness of breath.   Cardiovascular: Negative for chest pain and palpitations.  Gastrointestinal: Negative for abdominal pain and heartburn.  Genitourinary: Negative for dysuria.  Musculoskeletal: Positive for joint pain (bilateral knee with instability).  Neurological: Positive for focal weakness and weakness. Negative for dizziness, tingling and headaches.  Psychiatric/Behavioral: Negative for memory loss.  All other systems reviewed and are negative.       Past Medical History:    Diagnosis Date  . Atrial fibrillation ([place])   . Diabetes mellitus without complication ([place])   . Hyperlipidemia   . Hypertension   . TIA (transient ischemic attack)          Past Surgical History:  Procedure Laterality Date  . ABDOMINAL HYSTERECTOMY    . TEE WITHOUT CARDIOVERSION N/A 01/30/2017   Procedure: TRANSESOPHAGEAL ECHOCARDIOGRAM (TEE);  Surgeon: Adrian Prows, MD;  Location[facility] ENDOSCOPY;  Service: Cardiovascular;  Laterality: N/A;  . TUBAL LIGATION           Family History  Problem Relation Age of Onset  . Heart attack Father     Social History:  Reports that grandson lives with her and she was independent PTA. Was back to doing yardwork. Family lives in neighborhood and check in on her. She reports that she has never smoked. She has never used smokeless tobacco. She reports that she does not drink alcohol or use drugs.         Allergies  Allergen Reactions  . Penicillins Hives    Has patient had a PCN reaction causing immediate rash, facial/tongue/throat swelling, SOB or lightheadedness with hypotension: Yes Has patient had a PCN reaction causing severe rash involving mucus membranes or skin necrosis: Unknown Has patient had a PCN reaction that required hospitalization: Unknown Has patient had a PCN reaction occurring within the last 10 years: No If all of the above answers are "NO", then may proceed with Cephalosporin use.           Medications Prior to Admission  Medication Sig Dispense Refill  . aspirin 81 MG chewable tablet Chew 81 mg by mouth every evening.     Marland Kitchen  atenolol (TENORMIN) 25 MG tablet Take 25 mg by mouth daily.     Marland Kitchen CRANBERRY PO Take 1 tablet by mouth daily. **CHEW**    . ELIQUIS 5 MG TABS tablet Take 5 mg by mouth 2 (two) times daily.    Marland Kitchen lisinopril (PRINIVIL,ZESTRIL) 5 MG tablet Take 5 mg by mouth daily.     . meclizine (ANTIVERT) 25 MG tablet Take 25 mg by mouth 2 (two) times daily as needed for dizziness.     . metFORMIN (GLUCOPHAGE) 500 MG tablet Take 500 mg by mouth daily after breakfast.    . simvastatin (ZOCOR) 40 MG tablet Take 40 mg by mouth daily.    Marland Kitchen atorvastatin (LIPITOR) 20 MG tablet Take 1 tablet (20 mg total) by mouth daily at 6 PM. (Patient not taking: Reported on 03/07/2017) 30 tablet 0    Home: Home Living Family/patient expects to be discharged to:: Private residence Living Arrangements: Children Available Help at Discharge: Family, Industrial/product designer, Available 24 hours/day Type of Home: House Home Access: Stairs to enter Technical brewer of Steps: 6 Entrance Stairs-Rails: Right Home Layout: One level Bathroom Shower/Tub: Multimedia programmer: Standard Bathroom Accessibility: Yes Home Equipment: Civil engineer, contracting, Environmental consultant - 2 wheels  Functional History: Prior Function Level of Independence: Independent with assistive device(s) Comments: pt ambulates with use of RW Functional Status:  Mobility: Bed Mobility General bed mobility comments: pt sitting OOB in recliner chair when therapist entered room Transfers Overall transfer level: Needs assistance Equipment used: Rolling walker (2 wheeled) Transfers: Sit to/from Stand Sit to Stand: +2 physical assistance, +2 safety/equipment, Mod assist General transfer comment: block LLE, and assist to support trunk during boost  ADL: ADL Overall ADL's : Needs assistance/impaired Eating/Feeding: Minimal assistance, Sitting Eating/Feeding Details (indicate cue type and reason): Pt will; need assist for Bil tasks like cutting Grooming: Minimal assistance, Sitting Grooming Details (indicate cue type and reason): assist for bilateral hand tasks and sequencing multi-step Upper Body Bathing: Minimal assistance, Sitting Lower Body Bathing: Moderate assistance, Sitting/lateral leans Upper Body Dressing : Minimal assistance, Sitting Lower Body Dressing: Moderate assistance, +2 for physical assistance, +2 for safety/equipment,  Sit to/from stand Toilet Transfer: Moderate assistance, +2 for physical assistance, +2 for safety/equipment, Stand-pivot, BSC, RW Toilet Transfer Details (indicate cue type and reason): vc for safety, assist with weight shift and stability of LLE during transfer Toileting- Clothing Manipulation and Hygiene: Maximal assistance, Sit to/from stand Functional mobility during ADLs: Moderate assistance, +2 for physical assistance, +2 for safety/equipment, Rolling walker, Cueing for safety, Cueing for sequencing  Cognition: Cognition Overall Cognitive Status: Impaired/Different from baseline Orientation Level: Oriented X4 Cognition Arousal/Alertness: Awake/alert Behavior During Therapy: WFL for tasks assessed/performed Overall Cognitive Status: Impaired/Different from baseline Area of Impairment: Following commands, Safety/judgement, Problem solving, Attention Current Attention Level: Selective Memory: Decreased short-term memory Following Commands: Follows one step commands with increased time Safety/Judgement: Decreased awareness of deficits, Decreased awareness of safety Problem Solving: Requires verbal cues, Requires tactile cues General Comments: Pt very easily distracted, tangential in conversation, attempts to pick up RW during ambulation (no safety awarenes) and require verbal and tactile cues. Pt also perseverated on mowing her grass and being out in the yard.   Blood pressure (!) 146/77, pulse 79, temperature 98.8 F (37.1 C), temperature source Oral, resp. rate 20, height 5\' 4"  (1.626 m), weight 64.9 kg (143 lb), SpO2 95 %. Physical Exam  Nursing note and vitals reviewed. Constitutional: She is oriented to person, place, and time. She appears well-developed and well-nourished. No  distress.  HENT:  Head: Normocephalic and atraumatic.  Eyes: Conjunctivae and EOM are normal. Pupils are equal, round, and reactive to light.  Neck: Normal range of motion. Neck supple.    Cardiovascular: Normal rate and regular rhythm.   Respiratory: Effort normal and breath sounds normal. No stridor. No respiratory distress.  GI: Soft. Bowel sounds are normal. She exhibits no distension.  Musculoskeletal: She exhibits no edema or tenderness.  Neurological: She is alert and oriented to person, place, and time.  HOH.  Minimal dysarthria.  Able to state date and Pres.  Able to follow simple one and two step motor commands. Lacks insight/awareness of deficits.  Motor: RUE/RLE: 5/5 proximal to distal LUE: 4+/5 with apraxia LLE: HF 4/5, KE 3-/5, ADF/PF 2-/5 DTRs symmetric   Skin: Skin is warm and dry. She is not diaphoretic.  Psychiatric: She has a normal mood and affect. Her speech is normal and behavior is normal. Thought content normal. She expresses inappropriate judgment.    Lab Results Last 24 Hours       Results for orders placed or performed during the hospital encounter of 03/07/17 (from the past 24 hour(s))  Glucose, capillary     Status: Abnormal   Collection Time: 03/07/17 10:28 PM  Result Value Ref Range   Glucose-Capillary 129 (H) 65 - 99 mg/dL  Glucose, capillary     Status: Abnormal   Collection Time: 03/08/17  6:38 AM  Result Value Ref Range   Glucose-Capillary 111 (H) 65 - 99 mg/dL   Comment 1 Notify RN    Comment 2 Document in Chart   Glucose, capillary     Status: Abnormal   Collection Time: 03/08/17 11:30 AM  Result Value Ref Range   Glucose-Capillary 119 (H) 65 - 99 mg/dL   Comment 1 Notify RN    Comment 2 Document in Chart       Imaging Results (Last 48 hours)  Mr Jodene Nam Head Wo Contrast  Result Date: 03/07/2017 CLINICAL DATA:  Worsening LEFT-sided weakness, beginning earlier today. EXAM: MRI HEAD WITHOUT CONTRAST MRA HEAD WITHOUT CONTRAST TECHNIQUE: Multiplanar, multiecho pulse sequences of the brain and surrounding structures were obtained without intravenous contrast. Angiographic images of the head were obtained using  MRA technique without contrast. COMPARISON:  CT head 03/07/2017.  MR head 01/17/2017. FINDINGS: MRI HEAD FINDINGS Brain: Ovoid 1 cm area of restricted diffusion, corresponding low ADC, affects the posterior limb internal capsule and thalamus on the RIGHT, consistent with acute infarction. This is slightly lateral to the acute infarct demonstrated in May. There is extension cephalad to the periventricular white matter. No hemorrhage, mass lesion, or extra-axial fluid. Global atrophy with hydrocephalus ex vacuo. Mild white matter disease. Mineralization of not only the basal ganglia but the lateral thalamus and adjacent white matter, representing an area of chronic ischemia. Vascular: Normal flow voids. Skull and upper cervical spine: Normal marrow signal. Sinuses/Orbits: Negative. Other: None. MRA HEAD FINDINGS There is slight non stenotic irregularity of the cavernous and supraclinoid carotid arteries, without measurable stenosis. There is wide patency of basilar artery, and both vertebral arteries. No proximal stenosis of the anterior or middle cerebral arteries. No proximal PCA stenosis. Moderate irregularity in the RIGHT greater than LEFT P2 and P3 segments. No MCA branch irregularity or occlusion. No saccular aneurysm. IMPRESSION: New area of acute cerebral infarction involving the RIGHT lateral thalamus and posterior limb internal capsule, adjacent to the area of previous ischemia on the Jan 17, 2017 scan. Chronic intracranial changes as described.  No proximal vascular stenosis or occlusion. Electronically Signed   By: Staci Righter M.D.   On: 03/07/2017 19:00   Mr Brain Wo Contrast  Result Date: 03/07/2017 CLINICAL DATA:  Worsening LEFT-sided weakness, beginning earlier today. EXAM: MRI HEAD WITHOUT CONTRAST MRA HEAD WITHOUT CONTRAST TECHNIQUE: Multiplanar, multiecho pulse sequences of the brain and surrounding structures were obtained without intravenous contrast. Angiographic images of the head were  obtained using MRA technique without contrast. COMPARISON:  CT head 03/07/2017.  MR head 01/17/2017. FINDINGS: MRI HEAD FINDINGS Brain: Ovoid 1 cm area of restricted diffusion, corresponding low ADC, affects the posterior limb internal capsule and thalamus on the RIGHT, consistent with acute infarction. This is slightly lateral to the acute infarct demonstrated in May. There is extension cephalad to the periventricular white matter. No hemorrhage, mass lesion, or extra-axial fluid. Global atrophy with hydrocephalus ex vacuo. Mild white matter disease. Mineralization of not only the basal ganglia but the lateral thalamus and adjacent white matter, representing an area of chronic ischemia. Vascular: Normal flow voids. Skull and upper cervical spine: Normal marrow signal. Sinuses/Orbits: Negative. Other: None. MRA HEAD FINDINGS There is slight non stenotic irregularity of the cavernous and supraclinoid carotid arteries, without measurable stenosis. There is wide patency of basilar artery, and both vertebral arteries. No proximal stenosis of the anterior or middle cerebral arteries. No proximal PCA stenosis. Moderate irregularity in the RIGHT greater than LEFT P2 and P3 segments. No MCA branch irregularity or occlusion. No saccular aneurysm. IMPRESSION: New area of acute cerebral infarction involving the RIGHT lateral thalamus and posterior limb internal capsule, adjacent to the area of previous ischemia on the Jan 17, 2017 scan. Chronic intracranial changes as described. No proximal vascular stenosis or occlusion. Electronically Signed   By: Staci Righter M.D.   On: 03/07/2017 19:00   Ct Head Code Stroke W/o Cm  Result Date: 03/07/2017 CLINICAL DATA:  Code stroke.  Left-sided weakness.  Recent stroke EXAM: CT HEAD WITHOUT CONTRAST TECHNIQUE: Contiguous axial images were obtained from the base of the skull through the vertex without intravenous contrast. COMPARISON:  MRI 01/17/2017, CT 01/05/2017 FINDINGS: Brain:  Moderate atrophy. Physiologic calcification in the globus pallidus bilaterally. Asymmetric calcification right posterior and lateral thalamus unchanged from prior studies and appears chronic. Recent MRI demonstrated a small acute infarct in the posterior limb internal capsule. Negative for acute infarct, hemorrhage, or mass lesion. Vascular: Negative for hyperdense vessel Skull: Negative for skull fracture. Erosion of the dens with posterior pannus formation suggesting rheumatoid arthritis. Sinuses/Orbits: Air-fluid levels in the sphenoid sinus. Mucosal edema in the ethmoid sinuses. Bilateral lens replacement. Other: None ASPECTS (Gilbert Stroke Program Early CT Score) - Ganglionic level infarction (caudate, lentiform nuclei, internal capsule, insula, M1-M3 cortex): 7 - Supraganglionic infarction (M4-M6 cortex): 3 Total score (0-10 with 10 being normal): 10 IMPRESSION: 1. No acute intracranial abnormality 2. ASPECTS is 10 Electronically Signed   By: Franchot Gallo M.D.   On: 03/07/2017 14:06     Assessment/Plan: Diagnosis: Right CVA Labs and images independently reviewed.  Records reviewed and summated above. Stroke: Continue secondary stroke prophylaxis and Risk Factor Modification listed below:   Antiplatelet therapy:   Blood Pressure Management:  Continue current medication with prn's with permisive HTN per primary team Statin Agent:   Prediabetes management:   Left sided hemiparesis: fit for orthosis to prevent contractures  Motor recovery: Fluoxetine  1. Does the need for close, 24 hr/day medical supervision in concert with the patient's rehab needs make it unreasonable for  this patient to be served in a less intensive setting? Yes  2. Co-Morbidities requiring supervision/potential complications: DM (Monitor in accordance with exercise and adjust meds as necessary), HTN (monitor and provide prns in accordance with increased physical exertion and pain), CVA 4/18 and 5/18, A fib (cont meds,  monitor HR with increased mobility), ABLA (transfuse if necessary to ensure appropriate perfusion for increased activity tolerance) 3. Due to safety, disease management and patient education, does the patient require 24 hr/day rehab nursing? Yes 4. Does the patient require coordinated care of a physician, rehab nurse, PT (1-2 hrs/day, 5 days/week) and OT (1-2 hrs/day, 5 days/week) to address physical and functional deficits in the context of the above medical diagnosis(es)? Yes Addressing deficits in the following areas: balance, endurance, locomotion, strength, transferring, bathing, dressing, toileting and psychosocial support 5. Can the patient actively participate in an intensive therapy program of at least 3 hrs of therapy per day at least 5 days per week? Yes 6. The potential for patient to make measurable gains while on inpatient rehab is excellent 7. Anticipated functional outcomes upon discharge from inpatient rehab are supervision and min assist  with PT, supervision and min assist with OT, n/a with SLP. 8. Estimated rehab length of stay to reach the above functional goals is: 14-17 days. 9. Anticipated D/C setting: Home 10. Anticipated post D/C treatments: HH therapy and Home excercise program 11. Overall Rehab/Functional Prognosis: good  RECOMMENDATIONS: This patient's condition is appropriate for continued rehabilitative care in the following setting: CIR Patient has agreed to participate in recommended program. Yes Note that insurance prior authorization may be required for reimbursement for recommended care.  Comment: Rehab Admissions Coordinator to follow up.  Delice Lesch, MD, Mellody Drown Bary [place], [place] 03/08/2017    Revision History                   Routing History

## 2017-03-09 NOTE — Progress Notes (Addendum)
Report called to receiving nurse Hilda Blades, RN on unit [phone]. Awaiting echo to be done before transfering.

## 2017-03-09 NOTE — Progress Notes (Signed)
1652 Received pt to unit from 60M room 19 via bed. Pt accompanied with family; AOx4; oriented to unit, schedule and plan of care and reviewed medications. Denies needs or questions at present.   Michela Pitcher 03/09/2017

## 2017-03-09 NOTE — H&P (Signed)
Physical Medicine and Rehabilitation Admission H&P  Chief Complaint  Patient presents with  . Left sided weakness and numbness   HPI:  Christy Sutton is a 83 y.o. female with history of DM diet controlled, HTN,  CVA 4/18 and 5/18, A fib on eliquis who was admitted on 03/07/2017 with worsening of left sided weakness with numbness, left facial droop and concerns of stroke extension. MRI brain reviewed, showing right CVA.  Per report, restricted diffusion in right posterior limb internal capsule and thalamus consistent with acute infarct adjacent to area of prior ischemia and no stenosis or occlusion.  Dr. Leonie Man felt that stroke embolic likely due to A fib and recommended changing Eliquis to Pradaxa. Patient with resultant left sided weakness, cognitive deficits with no safety awareness and hard to redirect. CIR recommended due to deficits in mobility and decreased ability to carry out ADL tasks.    Review of Systems  HENT: Positive for hearing loss.   Eyes: Negative for blurred vision and double vision.  Respiratory: Negative for cough and shortness of breath.   Cardiovascular: Negative for chest pain and palpitations.  Gastrointestinal: Negative for heartburn and nausea.  Musculoskeletal: Negative for back pain and myalgias.  Neurological: Positive for sensory change and focal weakness. Negative for dizziness and headaches.  Psychiatric/Behavioral: The patient has insomnia (stays in recline most night then rests in bed in am.).   All other systems reviewed and are negative.    Past Medical History:  Diagnosis Date  . Atrial fibrillation ([place])   . Diabetes mellitus without complication ([place])   . Hyperlipidemia   . Hypertension   . TIA (transient ischemic attack)     Past Surgical History:  Procedure Laterality Date  . ABDOMINAL HYSTERECTOMY    . TEE WITHOUT CARDIOVERSION N/A 01/30/2017   Procedure: TRANSESOPHAGEAL ECHOCARDIOGRAM (TEE);  Surgeon: Adrian Prows, MD;  Location: [facility]  ENDOSCOPY;  Service: Cardiovascular;  Laterality: N/A;  . TUBAL LIGATION     Family History  Problem Relation Age of Onset  . Heart attack Father     Social History:  Reports that grandson lives with her and she was independent PTA. Was back to doing yardwork. Family lives in neighborhood and check in on her. She reports that she has never smoked. She has never used smokeless tobacco. She reports that she does not drink alcohol or use drugs.    Allergies  Allergen Reactions  . Penicillins Hives    Has patient had a PCN reaction causing immediate rash, facial/tongue/throat swelling, SOB or lightheadedness with hypotension: Yes Has patient had a PCN reaction causing severe rash involving mucus membranes or skin necrosis: Unknown Has patient had a PCN reaction that required hospitalization: Unknown Has patient had a PCN reaction occurring within the last 10 years: No If all of the above answers are "NO", then may proceed with Cephalosporin use.     Medications Prior to Admission  Medication Sig Dispense Refill  . aspirin 81 MG chewable tablet Chew 81 mg by mouth every evening.     Marland Kitchen atenolol (TENORMIN) 25 MG tablet Take 25 mg by mouth daily.     Marland Kitchen CRANBERRY PO Take 1 tablet by mouth daily. **CHEW**    . ELIQUIS 5 MG TABS tablet Take 5 mg by mouth 2 (two) times daily.    Marland Kitchen lisinopril (PRINIVIL,ZESTRIL) 5 MG tablet Take 5 mg by mouth daily.     . meclizine (ANTIVERT) 25 MG tablet Take 25 mg by mouth 2 (two) times  daily as needed for dizziness.    . metFORMIN (GLUCOPHAGE) 500 MG tablet Take 500 mg by mouth daily after breakfast.    . simvastatin (ZOCOR) 40 MG tablet Take 40 mg by mouth daily.    Marland Kitchen atorvastatin (LIPITOR) 20 MG tablet Take 1 tablet (20 mg total) by mouth daily at 6 PM. (Patient not taking: Reported on 03/07/2017) 30 tablet 0    Home: Home Living Family/patient expects to be discharged to:: Private residence Living Arrangements: Children Available Help at Discharge:  Family, Industrial/product designer, Available 24 hours/day Type of Home: House Home Access: Stairs to enter Technical brewer of Steps: 6 Entrance Stairs-Rails: Right Home Layout: One level Bathroom Shower/Tub: Multimedia programmer: Standard Bathroom Accessibility: Yes Home Equipment: Civil engineer, contracting, Environmental consultant - 2 wheels   Functional History: Prior Function Level of Independence: Independent with assistive device(s) Comments: pt ambulates with use of RW  Functional Status:  Mobility: Bed Mobility General bed mobility comments: pt sitting OOB in recliner chair when therapist entered room Transfers Overall transfer level: Needs assistance Equipment used: Rolling walker (2 wheeled) Transfers: Sit to/from Stand Sit to Stand: +2 physical assistance, +2 safety/equipment, Mod assist General transfer comment: block LLE, and assist to support trunk during transitions, verbal cues for safe hand placement Ambulation/Gait Ambulation/Gait assistance: +2 physical assistance, Mod assist Ambulation Distance (Feet): 5 Feet Assistive device: Rolling walker (2 wheeled) Gait Pattern/deviations: Step-to pattern, Decreased step length - left, Decreased stance time - left, Decreased dorsiflexion - left, Staggering left General Gait Details: Pt with significant left leg weakness, assist to both stabilize and progress left leg with not great grip control of the left hand on the RW, second person attempting to support trunk and weight shift pt to the right.  Pt trying to pick up the walker to get it over the IV pole despite cues to keep the walker on the ground (not sure if she heard me) without awareness that this might not be safe.  Chair pulled behind for safety.   Gait velocity: decreased Gait velocity interpretation: Below normal speed for age/gender    ADL: ADL Overall ADL's : Needs assistance/impaired Eating/Feeding: Minimal assistance, Sitting Eating/Feeding Details (indicate cue type and reason): Pt  will; need assist for Bil tasks like cutting Grooming: Minimal assistance, Sitting Grooming Details (indicate cue type and reason): assist for bilateral hand tasks and sequencing multi-step Upper Body Bathing: Minimal assistance, Sitting Lower Body Bathing: Moderate assistance, Sitting/lateral leans Upper Body Dressing : Minimal assistance, Sitting Lower Body Dressing: Moderate assistance, +2 for physical assistance, +2 for safety/equipment, Sit to/from stand Toilet Transfer: Moderate assistance, +2 for physical assistance, +2 for safety/equipment, Stand-pivot, BSC, RW Toilet Transfer Details (indicate cue type and reason): vc for safety, assist with weight shift and stability of LLE during transfer Toileting- Clothing Manipulation and Hygiene: Maximal assistance, Sit to/from stand Functional mobility during ADLs: Moderate assistance, +2 for physical assistance, +2 for safety/equipment, Rolling walker, Cueing for safety, Cueing for sequencing  Cognition: Cognition Overall Cognitive Status: Impaired/Different from baseline Orientation Level: Oriented X4 Cognition Arousal/Alertness: Awake/alert Behavior During Therapy: Impulsive Overall Cognitive Status: Impaired/Different from baseline Area of Impairment: Following commands, Safety/judgement, Problem solving, Attention, Awareness Current Attention Level: Selective Memory: Decreased short-term memory Following Commands: Follows one step commands with increased time Safety/Judgement: Decreased awareness of deficits, Decreased awareness of safety Awareness: Emergent Problem Solving: Requires verbal cues, Requires tactile cues General Comments: Pt very easily distracted, tangential in conversation, attempts to pick up RW during ambulation (no safety awarenes) and require  verbal and tactile cues. Pt also perseverated on mowing her grass and being out in the yard. Her hearing deficit also does not help our ability to communicate with her  effectively and her impulsiveness.    Blood pressure (!) 157/59, pulse (!) 57, temperature 98.5 F (36.9 C), temperature source Oral, resp. rate 18, height _0  (1.626 m), weight 64.9 kg (143 lb), SpO2 98 %. Physical Exam  Nursing note and vitals reviewed. Constitutional: She is oriented to person, place, and time. She appears well-developed and well-nourished.  HENT:  Head: Normocephalic and atraumatic.  Eyes: Conjunctivae and EOM are normal. Pupils are equal, round, and reactive to light.  Neck: Normal range of motion. Neck supple.  Cardiovascular: Normal rate and regular rhythm.   Respiratory: Effort normal and breath sounds normal. No stridor. No respiratory distress. She has no wheezes.  GI: Soft. Bowel sounds are normal. She exhibits no distension. There is no tenderness.  Musculoskeletal: She exhibits no edema or tenderness.  Neurological: She is alert and oriented to person, place, and time.  HOH.  Minimal dysarthria.  Able to state date and Pres.  Able to follow simple one and two step motor commands. Lacks insight/awareness of deficits.  Sensory deficits LUE>LLE Motor: RUE/RLE: 5/5 proximal to distal LUE: 4+/5 with apraxia LLE: HF 4/5, KE 3-/5, ADF/PF 2-/5 DTRs symmetric    Skin: Skin is warm and dry.  Psychiatric: She has a normal mood and affect. Her behavior is normal. Thought content normal.    Results for orders placed or performed during the hospital encounter of 03/07/17 (from the past 48 hour(s))  Protime-INR     Status: Abnormal   Collection Time: 03/07/17  1:44 PM  Result Value Ref Range   Prothrombin Time 16.4 (H) 11.4 - 15.2 seconds   INR 1.31   APTT     Status: Abnormal   Collection Time: 03/07/17  1:44 PM  Result Value Ref Range   aPTT 39 (H) 24 - 36 seconds    Comment:        IF BASELINE aPTT IS ELEVATED, SUGGEST PATIENT RISK ASSESSMENT BE USED TO DETERMINE APPROPRIATE ANTICOAGULANT THERAPY.   CBC     Status: Abnormal   Collection Time:  03/07/17  1:44 PM  Result Value Ref Range   WBC 6.9 4.0 - 10.5 K/uL   RBC 3.70 (L) 3.87 - 5.11 MIL/uL   Hemoglobin 11.2 (L) 12.0 - 15.0 g/dL   HCT 33.1 (L) 36.0 - 46.0 %   MCV 89.5 78.0 - 100.0 fL   MCH 30.3 26.0 - 34.0 pg   MCHC 33.8 30.0 - 36.0 g/dL   RDW 13.0 11.5 - 15.5 %   Platelets 157 150 - 400 K/uL  Differential     Status: None   Collection Time: 03/07/17  1:44 PM  Result Value Ref Range   Neutrophils Relative % 75 %   Neutro Abs 5.1 1.7 - 7.7 K/uL   Lymphocytes Relative 11 %   Lymphs Abs 0.8 0.7 - 4.0 K/uL   Monocytes Relative 8 %   Monocytes Absolute 0.6 0.1 - 1.0 K/uL   Eosinophils Relative 6 %   Eosinophils Absolute 0.4 0.0 - 0.7 K/uL   Basophils Relative 0 %   Basophils Absolute 0.0 0.0 - 0.1 K/uL  Comprehensive metabolic panel     Status: Abnormal   Collection Time: 03/07/17  1:44 PM  Result Value Ref Range   Sodium 140 135 - 145 mmol/L   Potassium 3.7 3.5 -  5.1 mmol/L   Chloride 109 101 - 111 mmol/L   CO2 26 22 - 32 mmol/L   Glucose, Bld 133 (H) 65 - 99 mg/dL   BUN 7 6 - 20 mg/dL   Creatinine, Ser 0.74 0.44 - 1.00 mg/dL   Calcium 9.1 8.9 - 10.3 mg/dL   Total Protein 5.8 (L) 6.5 - 8.1 g/dL   Albumin 3.3 (L) 3.5 - 5.0 g/dL   AST 26 15 - 41 U/L   ALT 14 14 - 54 U/L   Alkaline Phosphatase 41 38 - 126 U/L   Total Bilirubin 0.4 0.3 - 1.2 mg/dL   GFR calc non Af Amer >60 >60 mL/min   GFR calc Af Amer >60 >60 mL/min    Comment: (NOTE) The eGFR has been calculated using the CKD EPI equation. This calculation has not been validated in all clinical situations. eGFR's persistently <60 mL/min signify possible Chronic Kidney Disease.    Anion gap 5 5 - 15  I-stat troponin, ED     Status: None   Collection Time: 03/07/17  1:48 PM  Result Value Ref Range   Troponin i, poc 0.00 0.00 - 0.08 ng/mL   Comment 3            Comment: Due to the release kinetics of cTnI, a negative result within the first hours of the onset of symptoms does not rule out myocardial  infarction with certainty. If myocardial infarction is still suspected, repeat the test at appropriate intervals.   I-Stat Chem 8, ED     Status: Abnormal   Collection Time: 03/07/17  1:50 PM  Result Value Ref Range   Sodium 142 135 - 145 mmol/L   Potassium 3.7 3.5 - 5.1 mmol/L   Chloride 107 101 - 111 mmol/L   BUN 9 6 - 20 mg/dL   Creatinine, Ser 0.70 0.44 - 1.00 mg/dL   Glucose, Bld 130 (H) 65 - 99 mg/dL   Calcium, Ion 1.14 (L) 1.15 - 1.40 mmol/L   TCO2 26 0 - 100 mmol/L   Hemoglobin 10.2 (L) 12.0 - 15.0 g/dL   HCT 30.0 (L) 36.0 - 46.0 %  CBG monitoring, ED     Status: Abnormal   Collection Time: 03/07/17  2:10 PM  Result Value Ref Range   Glucose-Capillary 154 (H) 65 - 99 mg/dL  Glucose, capillary     Status: Abnormal   Collection Time: 03/07/17 10:28 PM  Result Value Ref Range   Glucose-Capillary 129 (H) 65 - 99 mg/dL  Glucose, capillary     Status: Abnormal   Collection Time: 03/08/17  6:38 AM  Result Value Ref Range   Glucose-Capillary 111 (H) 65 - 99 mg/dL   Comment 1 Notify RN    Comment 2 Document in Chart   Glucose, capillary     Status: Abnormal   Collection Time: 03/08/17 11:30 AM  Result Value Ref Range   Glucose-Capillary 119 (H) 65 - 99 mg/dL   Comment 1 Notify RN    Comment 2 Document in Chart   Glucose, capillary     Status: Abnormal   Collection Time: 03/08/17  4:32 PM  Result Value Ref Range   Glucose-Capillary 107 (H) 65 - 99 mg/dL   Comment 1 Notify RN    Comment 2 Document in Chart   Glucose, capillary     Status: Abnormal   Collection Time: 03/08/17  9:50 PM  Result Value Ref Range   Glucose-Capillary 119 (H) 65 - 99 mg/dL  Comment 1 Notify RN    Comment 2 Document in Chart   Basic metabolic panel     Status: Abnormal   Collection Time: 03/09/17  6:44 AM  Result Value Ref Range   Sodium 140 135 - 145 mmol/L   Potassium 3.6 3.5 - 5.1 mmol/L   Chloride 108 101 - 111 mmol/L   CO2 24 22 - 32 mmol/L   Glucose, Bld 98 65 - 99 mg/dL   BUN 6 6  - 20 mg/dL   Creatinine, Ser 0.70 0.44 - 1.00 mg/dL   Calcium 8.4 (L) 8.9 - 10.3 mg/dL   GFR calc non Af Amer >60 >60 mL/min   GFR calc Af Amer >60 >60 mL/min    Comment: (NOTE) The eGFR has been calculated using the CKD EPI equation. This calculation has not been validated in all clinical situations. eGFR's persistently <60 mL/min signify possible Chronic Kidney Disease.    Anion gap 8 5 - 15  Glucose, capillary     Status: None   Collection Time: 03/09/17  6:49 AM  Result Value Ref Range   Glucose-Capillary 97 65 - 99 mg/dL  Glucose, capillary     Status: Abnormal   Collection Time: 03/09/17 11:55 AM  Result Value Ref Range   Glucose-Capillary 141 (H) 65 - 99 mg/dL   Comment 1 Notify RN    Comment 2 Document in Chart    [name] Wo Contrast  Result Date: 03/07/2017 CLINICAL DATA:  Worsening LEFT-sided weakness, beginning earlier today. EXAM: MRI HEAD WITHOUT CONTRAST MRA HEAD WITHOUT CONTRAST TECHNIQUE: Multiplanar, multiecho pulse sequences of the brain and surrounding structures were obtained without intravenous contrast. Angiographic images of the head were obtained using MRA technique without contrast. COMPARISON:  CT head 03/07/2017.  MR head 01/17/2017. FINDINGS: MRI HEAD FINDINGS Brain: Ovoid 1 cm area of restricted diffusion, corresponding low ADC, affects the posterior limb internal capsule and thalamus on the RIGHT, consistent with acute infarction. This is slightly lateral to the acute infarct demonstrated in May. There is extension cephalad to the periventricular white matter. No hemorrhage, mass lesion, or extra-axial fluid. Global atrophy with hydrocephalus ex vacuo. Mild white matter disease. Mineralization of not only the basal ganglia but the lateral thalamus and adjacent white matter, representing an area of chronic ischemia. Vascular: Normal flow voids. Skull and upper cervical spine: Normal marrow signal. Sinuses/Orbits: Negative. Other: None. MRA HEAD FINDINGS There  is slight non stenotic irregularity of the cavernous and supraclinoid carotid arteries, without measurable stenosis. There is wide patency of basilar artery, and both vertebral arteries. No proximal stenosis of the anterior or middle cerebral arteries. No proximal PCA stenosis. Moderate irregularity in the RIGHT greater than LEFT P2 and P3 segments. No MCA branch irregularity or occlusion. No saccular aneurysm. IMPRESSION: New area of acute cerebral infarction involving the RIGHT lateral thalamus and posterior limb internal capsule, adjacent to the area of previous ischemia on the Jan 17, 2017 scan. Chronic intracranial changes as described. No proximal vascular stenosis or occlusion. Electronically Signed   By: Staci Righter M.D.   On: 03/07/2017 19:00   Mr Brain Wo Contrast  Result Date: 03/07/2017 CLINICAL DATA:  Worsening LEFT-sided weakness, beginning earlier today. EXAM: MRI HEAD WITHOUT CONTRAST MRA HEAD WITHOUT CONTRAST TECHNIQUE: Multiplanar, multiecho pulse sequences of the brain and surrounding structures were obtained without intravenous contrast. Angiographic images of the head were obtained using MRA technique without contrast. COMPARISON:  CT head 03/07/2017.  MR head 01/17/2017. FINDINGS: MRI HEAD  FINDINGS Brain: Ovoid 1 cm area of restricted diffusion, corresponding low ADC, affects the posterior limb internal capsule and thalamus on the RIGHT, consistent with acute infarction. This is slightly lateral to the acute infarct demonstrated in May. There is extension cephalad to the periventricular white matter. No hemorrhage, mass lesion, or extra-axial fluid. Global atrophy with hydrocephalus ex vacuo. Mild white matter disease. Mineralization of not only the basal ganglia but the lateral thalamus and adjacent white matter, representing an area of chronic ischemia. Vascular: Normal flow voids. Skull and upper cervical spine: Normal marrow signal. Sinuses/Orbits: Negative. Other: None. MRA HEAD  FINDINGS There is slight non stenotic irregularity of the cavernous and supraclinoid carotid arteries, without measurable stenosis. There is wide patency of basilar artery, and both vertebral arteries. No proximal stenosis of the anterior or middle cerebral arteries. No proximal PCA stenosis. Moderate irregularity in the RIGHT greater than LEFT P2 and P3 segments. No MCA branch irregularity or occlusion. No saccular aneurysm. IMPRESSION: New area of acute cerebral infarction involving the RIGHT lateral thalamus and posterior limb internal capsule, adjacent to the area of previous ischemia on the Jan 17, 2017 scan. Chronic intracranial changes as described. No proximal vascular stenosis or occlusion. Electronically Signed   By: Staci Righter M.D.   On: 03/07/2017 19:00   Ct Head Code Stroke W/o Cm  Result Date: 03/07/2017 CLINICAL DATA:  Code stroke.  Left-sided weakness.  Recent stroke EXAM: CT HEAD WITHOUT CONTRAST TECHNIQUE: Contiguous axial images were obtained from the base of the skull through the vertex without intravenous contrast. COMPARISON:  MRI 01/17/2017, CT 01/05/2017 FINDINGS: Brain: Moderate atrophy. Physiologic calcification in the globus pallidus bilaterally. Asymmetric calcification right posterior and lateral thalamus unchanged from prior studies and appears chronic. Recent MRI demonstrated a small acute infarct in the posterior limb internal capsule. Negative for acute infarct, hemorrhage, or mass lesion. Vascular: Negative for hyperdense vessel Skull: Negative for skull fracture. Erosion of the dens with posterior pannus formation suggesting rheumatoid arthritis. Sinuses/Orbits: Air-fluid levels in the sphenoid sinus. Mucosal edema in the ethmoid sinuses. Bilateral lens replacement. Other: None ASPECTS ([place] Stroke Program Early CT Score) - Ganglionic level infarction (caudate, lentiform nuclei, internal capsule, insula, M1-M3 cortex): 7 - Supraganglionic infarction (M4-M6 cortex): 3 Total  score (0-10 with 10 being normal): 10 IMPRESSION: 1. No acute intracranial abnormality 2. ASPECTS is 10 Electronically Signed   By: Franchot Gallo M.D.   On: 03/07/2017 14:06   Medical Problem List and Plan: 1.   Left sided weakness, cognitive deficits with no safety awareness and hard to redirect secondary to right CVA.  2.  DVT Prophylaxis/Anticoagulation: Pharmaceutical: Pradexa 3. Pain Management: tylenol prn 4. Mood: LCSW to follow for evaluation and support 5. Neuropsych: This patient is capable of making decisions on her own behalf. 6. Skin/Wound Care: routine pressure relief measures.  7. Fluids/Electrolytes/Nutrition: Monitor I/O. Check lytes in am. 8. Anemia: Has had drop in H/H since admission. Will recheck in am 9. DM diet controlled: Hgb A1c- 6.0.  BS stable overall will check BS bid with SSI as needed. Poor intake--will d/c cardiac restrictions and continue CM diet.  10. HTN: monitor BP bid and avoid hypotension. Continue atenolol daily.   Post Admission Physician Evaluation: 1. Preadmission assessment reviewed and changes made below. 2. Functional deficits secondary  to right CVA.  3. Patient is admitted to receive collaborative, interdisciplinary care between the physiatrist, rehab nursing staff, and therapy team. 4. Patient's level of medical complexity and substantial therapy needs in  context of that medical necessity cannot be provided at a lesser intensity of care such as a SNF. 5. Patient has experienced substantial functional loss from his/her baseline which was documented above under the "Functional History" and "Functional Status" headings.  Judging by the patient's diagnosis, physical exam, and functional history, the patient has potential for functional progress which will result in measurable gains while on inpatient rehab.  These gains will be of substantial and practical use upon discharge  in facilitating mobility and self-care at the household level. 66. Physiatrist  will provide 24 hour management of medical needs as well as oversight of the therapy plan/treatment and provide guidance as appropriate regarding the interaction of the two. 7. 24 hour rehab nursing will assist with safety, disease management and patient education  and help integrate therapy concepts, techniques,education, etc. 8. PT will assess and treat for/with: Lower extremity strength, range of motion, stamina, balance, functional mobility, safety, adaptive techniques and equipment, coping skills, pain control, stroke education.   Goals are: Supervision/Min A. 9. OT will assess and treat for/with: ADL's, functional mobility, safety, upper extremity strength, adaptive techniques and equipment, ego support, and community reintegration.   Goals are: Supervision/Min A. Therapy may proceed with showering this patient. 10. Case Management and Social Worker will assess and treat for psychological issues and discharge planning. 11. Team conference will be held weekly to assess progress toward goals and to determine barriers to discharge. 12. Patient will receive at least 3 hours of therapy per day at least 5 days per week. 13. ELOS: 14-17 days.       14. Prognosis:  good  Delice Lesch, MD, [address] [place] 03/09/2017

## 2017-03-09 NOTE — Progress Notes (Signed)
I met with pt at bedside and then contacted her son, Gwyndolyn Saxon and Daughter in law [name] to discuss rehab goals and expectations. All are in agreement toad mit. I contacted Dr. Wynelle Cleveland and she is in agreement to d/c . I will make the arrangements to admit today. RN CM made aware.[phone]

## 2017-03-09 NOTE — Discharge Summary (Signed)
Physician Discharge Summary  Christy Sutton WIO:[id number] DOB: October 22, 1925 DOA: 03/07/2017  PCP: System, Pcp Not In  Admit date: 03/07/2017 Discharge date: 03/09/2017  Admitted From: home Disposition:  CIR   Follow up: needs 2 D ECHO completed- continue Pradaxa per neurology  Discharge Condition:  stable   CODE STATUS:  DNR   Consultations:  Neurology    Discharge Diagnoses:  Principal Problem:   Cerebral embolism with cerebral infarction Active Problems:   Essential hypertension   Diabetes mellitus with complication ([place])   Permanent atrial fibrillation ([place])   Diabetes mellitus type 2 in nonobese ([place])   History of CVA (cerebrovascular accident)    Subjective: Continues to have difficulty using her left arm and leg. No new neurological complaints.  No other new complaints such as chest pain, dyspnea, nausea, vomiting constipation, diarrhea or dysuria.   Brief Summary: Christy Sutton 83 y.o.femalewith medical history significant stroke, diabetes, A. fib, hypertension, poor hearing presenting to the hospital from home with worsening left-sided weakness and is found to have an ischemic infarct in the right thalamus and post limb of the internal capsule. She's had 2 prior strokes in April and May of this year. The second occurred while on Eliquis.  Please see detailed HPI for further information regarding her admission.   Hospital Course:  Principal Problem:   Left-sided arm and leg weakness/  Cerebral embolism with cerebral infarction   Recurrent CVAs - CT head (Code Stroke)- no acute abnormality - infarct occurred despite the fact that she states she took her Eliquis on the morning her symptoms started - due to being on Eliquis, not a candidate for tPA - MRI head >> RIGHT lateral thalamus and posterior limb internal capsule, adjacent to the area of previous ischemia on the Jan 17, 2017 scan. -last lipid panel in labs below- LDL 63  - no further work up needed as she had a full  work up recently in May (including a TEE- this was inconclusive for a thrombus as it was difficult to tell if there was a thrombus in the Left atrial appendage apex)- Neuro suspect that she has a cardio-embolic source  - this is her 2nd infarct on Eliquis- Neurology recommending switching to Pradaxa - current deficits are left sided arm and leg ataxia and about 3/4 weakness - PT/OT eval >> CIR  Recommended -   Active Problems:   Essential hypertension - Lisinopril and Atenolol held overnight due to acute CVA - Atenolol resumed yesterday - can resume Lisinopril tomorrow    Diabetes mellitus with complication ([place]) - SSI in hospital- Metformin to be resumed - last A1c 6.0 on 01/18/17    Permanent atrial fibrillation ([facility]) - CHA2DS2-VASc Score at least 7 - Atenolol was on hold for permissive HTN and has been resumed - rate has been controlled - Pradaxa is a new medication for her in place of Eliquis - of note Aspirin is to be continued  Discharge Instructions  Discharge Instructions    Diet - low sodium heart healthy    Complete by:  As directed    Diet Carb Modified    Complete by:  As directed    Increase activity slowly    Complete by:  As directed      Allergies as of 03/09/2017      Reactions   Penicillins Hives   Has patient had a PCN reaction causing immediate rash, facial/tongue/throat swelling, SOB or lightheadedness with hypotension: Yes Has patient had a PCN reaction causing severe rash  involving mucus membranes or skin necrosis: Unknown Has patient had a PCN reaction that required hospitalization: Unknown Has patient had a PCN reaction occurring within the last 10 years: No If all of the above answers are "NO", then may proceed with Cephalosporin use.      Medication List    STOP taking these medications   atorvastatin 20 MG tablet Commonly known as:  LIPITOR   ELIQUIS 5 MG Tabs tablet Generic drug:  apixaban   meclizine 25 MG tablet Commonly known as:   ANTIVERT     TAKE these medications   aspirin 81 MG chewable tablet Chew 81 mg by mouth every evening.   atenolol 25 MG tablet Commonly known as:  TENORMIN Take 25 mg by mouth daily.   CRANBERRY PO Take 1 tablet by mouth daily. **CHEW**   dabigatran 150 MG Caps capsule Commonly known as:  PRADAXA Take 1 capsule (150 mg total) by mouth every 12 (twelve) hours.   lisinopril 5 MG tablet Commonly known as:  PRINIVIL,ZESTRIL Take 5 mg by mouth daily.   metFORMIN 500 MG tablet Commonly known as:  GLUCOPHAGE Take 500 mg by mouth daily after breakfast.   simvastatin 40 MG tablet Commonly known as:  ZOCOR Take 40 mg by mouth daily.       Allergies  Allergen Reactions  . Penicillins Hives    Has patient had a PCN reaction causing immediate rash, facial/tongue/throat swelling, SOB or lightheadedness with hypotension: Yes Has patient had a PCN reaction causing severe rash involving mucus membranes or skin necrosis: Unknown Has patient had a PCN reaction that required hospitalization: Unknown Has patient had a PCN reaction occurring within the last 10 years: No If all of the above answers are "NO", then may proceed with Cephalosporin use.      Procedures/Studies:    Mr Christy Sutton NL Contrast  Result Date: 03/07/2017 CLINICAL DATA:  Worsening LEFT-sided weakness, beginning earlier today. EXAM: MRI HEAD WITHOUT CONTRAST MRA HEAD WITHOUT CONTRAST TECHNIQUE: Multiplanar, multiecho pulse sequences of the brain and surrounding structures were obtained without intravenous contrast. Angiographic images of the head were obtained using MRA technique without contrast. COMPARISON:  CT head 03/07/2017.  MR head 01/17/2017. FINDINGS: MRI HEAD FINDINGS Brain: Ovoid 1 cm area of restricted diffusion, corresponding low ADC, affects the posterior limb internal capsule and thalamus on the RIGHT, consistent with acute infarction. This is slightly lateral to the acute infarct demonstrated in May. There  is extension cephalad to the periventricular white matter. No hemorrhage, mass lesion, or extra-axial fluid. Global atrophy with hydrocephalus ex vacuo. Mild white matter disease. Mineralization of not only the basal ganglia but the lateral thalamus and adjacent white matter, representing an area of chronic ischemia. Vascular: Normal flow voids. Skull and upper cervical spine: Normal marrow signal. Sinuses/Orbits: Negative. Other: None. MRA HEAD FINDINGS There is slight non stenotic irregularity of the cavernous and supraclinoid carotid arteries, without measurable stenosis. There is wide patency of basilar artery, and both vertebral arteries. No proximal stenosis of the anterior or middle cerebral arteries. No proximal PCA stenosis. Moderate irregularity in the RIGHT greater than LEFT P2 and P3 segments. No MCA branch irregularity or occlusion. No saccular aneurysm. IMPRESSION: New area of acute cerebral infarction involving the RIGHT lateral thalamus and posterior limb internal capsule, adjacent to the area of previous ischemia on the Jan 17, 2017 scan. Chronic intracranial changes as described. No proximal vascular stenosis or occlusion. Electronically Signed   By: Roderic Ovens.D.  On: 03/07/2017 19:00   Mr Brain Wo Contrast  Result Date: 03/07/2017 CLINICAL DATA:  Worsening LEFT-sided weakness, beginning earlier today. EXAM: MRI HEAD WITHOUT CONTRAST MRA HEAD WITHOUT CONTRAST TECHNIQUE: Multiplanar, multiecho pulse sequences of the brain and surrounding structures were obtained without intravenous contrast. Angiographic images of the head were obtained using MRA technique without contrast. COMPARISON:  CT head 03/07/2017.  MR head 01/17/2017. FINDINGS: MRI HEAD FINDINGS Brain: Ovoid 1 cm area of restricted diffusion, corresponding low ADC, affects the posterior limb internal capsule and thalamus on the RIGHT, consistent with acute infarction. This is slightly lateral to the acute infarct demonstrated in  May. There is extension cephalad to the periventricular white matter. No hemorrhage, mass lesion, or extra-axial fluid. Global atrophy with hydrocephalus ex vacuo. Mild white matter disease. Mineralization of not only the basal ganglia but the lateral thalamus and adjacent white matter, representing an area of chronic ischemia. Vascular: Normal flow voids. Skull and upper cervical spine: Normal marrow signal. Sinuses/Orbits: Negative. Other: None. MRA HEAD FINDINGS There is slight non stenotic irregularity of the cavernous and supraclinoid carotid arteries, without measurable stenosis. There is wide patency of basilar artery, and both vertebral arteries. No proximal stenosis of the anterior or middle cerebral arteries. No proximal PCA stenosis. Moderate irregularity in the RIGHT greater than LEFT P2 and P3 segments. No MCA branch irregularity or occlusion. No saccular aneurysm. IMPRESSION: New area of acute cerebral infarction involving the RIGHT lateral thalamus and posterior limb internal capsule, adjacent to the area of previous ischemia on the Jan 17, 2017 scan. Chronic intracranial changes as described. No proximal vascular stenosis or occlusion. Electronically Signed   By: Staci Righter M.D.   On: 03/07/2017 19:00   Ct Head Code Stroke W/o Cm  Result Date: 03/07/2017 CLINICAL DATA:  Code stroke.  Left-sided weakness.  Recent stroke EXAM: CT HEAD WITHOUT CONTRAST TECHNIQUE: Contiguous axial images were obtained from the base of the skull through the vertex without intravenous contrast. COMPARISON:  MRI 01/17/2017, CT 01/05/2017 FINDINGS: Brain: Moderate atrophy. Physiologic calcification in the globus pallidus bilaterally. Asymmetric calcification right posterior and lateral thalamus unchanged from prior studies and appears chronic. Recent MRI demonstrated a small acute infarct in the posterior limb internal capsule. Negative for acute infarct, hemorrhage, or mass lesion. Vascular: Negative for hyperdense  vessel Skull: Negative for skull fracture. Erosion of the dens with posterior pannus formation suggesting rheumatoid arthritis. Sinuses/Orbits: Air-fluid levels in the sphenoid sinus. Mucosal edema in the ethmoid sinuses. Bilateral lens replacement. Other: None ASPECTS ([facility] Stroke Program Early CT Score) - Ganglionic level infarction (caudate, lentiform nuclei, internal capsule, insula, M1-M3 cortex): 7 - Supraganglionic infarction (M4-M6 cortex): 3 Total score (0-10 with 10 being normal): 10 IMPRESSION: 1. No acute intracranial abnormality 2. ASPECTS is 10 Electronically Signed   By: Franchot Gallo M.D.   On: 03/07/2017 14:06       Discharge Exam: Vitals:   03/09/17 0501 03/09/17 1100  BP: (!) 184/94 (!) 157/59  Pulse: 73 (!) 57  Resp: 18 18  Temp: 97.7 F (36.5 C) 98.5 F (36.9 C)   Vitals:   03/08/17 2112 03/09/17 0033 03/09/17 0501 03/09/17 1100  BP: (!) 147/56 (!) 156/58 (!) 184/94 (!) 157/59  Pulse: 68 69 73 (!) 57  Resp: 18 18 18 18   Temp: 99 F (37.2 C) 98 F (36.7 C) 97.7 F (36.5 C) 98.5 F (36.9 C)  TempSrc: Oral  Oral Oral  SpO2: 96% 98% 100% 98%  Weight:  Height:        General: Pt is alert, awake, not in acute distress Cardiovascular: RRR, S1/S2 +, no rubs, no gallops Respiratory: CTA bilaterally, no wheezing, no rhonchi Abdominal: Soft, NT, ND, bowel sounds + Extremities: no edema, no cyanosis    The results of significant diagnostics from this hospitalization (including imaging, microbiology, ancillary and laboratory) are listed below for reference.     Microbiology: No results found for this or any previous visit (from the past 240 hour(s)).   Labs:  Lipid Panel     Component Value Date/Time   CHOL 146 01/18/2017 0756   TRIG 75 01/18/2017 0756   HDL 68 01/18/2017 0756   CHOLHDL 2.1 01/18/2017 0756   VLDL 15 01/18/2017 0756   LDLCALC 63 01/18/2017 0756    BNP (last 3 results) No results for input(s): BNP in the last 8760 hours. Basic  Metabolic Panel:  Recent Labs Lab 03/07/17 1344 03/07/17 1350 03/09/17 0644  NA 140 142 140  K 3.7 3.7 3.6  CL 109 107 108  CO2 26  --  24  GLUCOSE 133* 130* 98  BUN 7 9 6   CREATININE 0.74 0.70 0.70  CALCIUM 9.1  --  8.4*   Liver Function Tests:  Recent Labs Lab 03/07/17 1344  AST 26  ALT 14  ALKPHOS 41  BILITOT 0.4  PROT 5.8*  ALBUMIN 3.3*   No results for input(s): LIPASE, AMYLASE in the last 168 hours. No results for input(s): AMMONIA in the last 168 hours. CBC:  Recent Labs Lab 03/07/17 1344 03/07/17 1350  WBC 6.9  --   NEUTROABS 5.1  --   HGB 11.2* 10.2*  HCT 33.1* 30.0*  MCV 89.5  --   PLT 157  --    Cardiac Enzymes: No results for input(s): CKTOTAL, CKMB, CKMBINDEX, TROPONINI in the last 168 hours. BNP: Invalid input(s): POCBNP CBG:  Recent Labs Lab 03/08/17 0638 03/08/17 1130 03/08/17 1632 03/08/17 2150 03/09/17 0649  GLUCAP 111* 119* 107* 119* 97   D-Dimer No results for input(s): DDIMER in the last 72 hours. Hgb A1c No results for input(s): HGBA1C in the last 72 hours. Lipid Profile No results for input(s): CHOL, HDL, LDLCALC, TRIG, CHOLHDL, LDLDIRECT in the last 72 hours. Thyroid function studies No results for input(s): TSH, T4TOTAL, T3FREE, THYROIDAB in the last 72 hours.  Invalid input(s): FREET3 Anemia work up No results for input(s): VITAMINB12, FOLATE, FERRITIN, TIBC, IRON, RETICCTPCT in the last 72 hours. Urinalysis No results found for: COLORURINE, APPEARANCEUR, LABSPEC, [place], GLUCOSEU, HGBUR, BILIRUBINUR, KETONESUR, PROTEINUR, UROBILINOGEN, NITRITE, LEUKOCYTESUR Sepsis Labs Invalid input(s): PROCALCITONIN,  WBC,  LACTICIDVEN Microbiology No results found for this or any previous visit (from the past 240 hour(s)).   Time coordinating discharge: Over 30 minutes  SIGNED:   Debbe Odea, MD  [facility] 03/09/2017, 12:06 PM Pager   If 7PM-7AM, please contact night-coverage [email or web address] Password [email or web address]

## 2017-03-09 NOTE — Progress Notes (Signed)
CSW alerted by Rehab Admission Coordinator that patient will admit to CIR today. CSW not needed to facilitate discharge planning.  CSW signing off.  Laveda Abbe, [place] Clinical Social Worker [phone]

## 2017-03-09 NOTE — Progress Notes (Signed)
STROKE TEAM PROGRESS NOTE   HISTORY OF PRESENT ILLNESS (per record) Christy Sutton is an 83 y.o. female who woke up the morning of 03/07/2017 at her baseline. She has history of 2 prior strokes in April as well as May 2018 from atrial fibrillation despite being on eliquis. The patient apparently ate breakfast 03/07/2017 morning and was fine but at around 9 AM noticed sudden onset of left-sided weakness and incoordination. She did not call for help immediately and waited to see if symptoms improve. Later on as she did not feel better, and EMS were called and code stroke was activated en route. Upon arrival patient was met by the stroke team and noted to have left arm clumsiness and left leg weakness. CT scan of the head was obtained emergently which showed new area of acute cerebral infarction involving the RIGHT lateral thalamus and posterior limb internal capsule, adjacent to the area of previous ischemia on the Jan 17, 2017 scan. Patient's NIH stroke scale on admission was felt to be 3 by RN but by Dr. Clydene Fake documentation it was 4.  Patient states she has taken her dose of eliquis the morning of 03/07/2017. Review of patient's medical records revealed that she had a right posterior limb internal capsule infarct adjacent to the calcific area in the right parietal white matter during the last admission a month ago. Eliquis was continued. She made good physical recovery and was able to life at home with close supervision by her family. LSN : 9 am 03/07/17 NIHSS 4  Patient was not administered IV t-PA secondary to anticoagulation with eliquis. She was admitted to General Neurology for further evaluation and treatment.   SUBJECTIVE (INTERVAL HISTORY) The patient is awake and alert. Her neurological deficits appear unchanged The patient is awaitnng rehab transfer  OBJECTIVE Temp:  [97.7 F (36.5 C)-99 F (37.2 C)] 98.5 F (36.9 C) (06/29 1100) Pulse Rate:  [57-79] 57 (06/29 1100) Cardiac Rhythm: Atrial  fibrillation (06/29 0700) Resp:  [18-20] 18 (06/29 1100) BP: (146-184)/(56-94) 157/59 (06/29 1100) SpO2:  [95 %-100 %] 98 % (06/29 1100)  CBC:   Recent Labs Lab 03/07/17 1344 03/07/17 1350  WBC 6.9  --   NEUTROABS 5.1  --   HGB 11.2* 10.2*  HCT 33.1* 30.0*  MCV 89.5  --   PLT 157  --     Basic Metabolic Panel:   Recent Labs Lab 03/07/17 1344 03/07/17 1350 03/09/17 0644  NA 140 142 140  K 3.7 3.7 3.6  CL 109 107 108  CO2 26  --  24  GLUCOSE 133* 130* 98  BUN _0 CREATININE 0.74 0.70 0.70  CALCIUM 9.1  --  8.4*    Lipid Panel:     Component Value Date/Time   CHOL 146 01/18/2017 0756   TRIG 75 01/18/2017 0756   HDL 68 01/18/2017 0756   CHOLHDL 2.1 01/18/2017 0756   VLDL 15 01/18/2017 0756   LDLCALC 63 01/18/2017 0756   HgbA1c:  Lab Results  Component Value Date   HGBA1C 6.0 (H) 01/18/2017   Urine Drug Screen: No results found for: LABOPIA, COCAINSCRNUR, LABBENZ, AMPHETMU, THCU, LABBARB  Alcohol Level No results found for: [facility]  IMAGING  Mr [facility] Contrast Mr Brain Wo Contrast 03/07/2017 IMPRESSION: New area of acute cerebral infarction involving the RIGHT lateral thalamus and posterior limb internal capsule, adjacent to the area of previous ischemia on the Jan 17, 2017 scan. Chronic intracranial changes as described. No proximal vascular  stenosis or occlusion.   Ct Head Code Stroke W/o Cm 03/07/2017 IMPRESSION: 1. No acute intracranial abnormality 2. ASPECTS is 10      PHYSICAL EXAM frail elderly Caucasian lady currently not in distress. . Afebrile. Head is nontraumatic. Neck is supple without bruit.    Cardiac exam no murmur or gallop. Lungs are clear to auscultation. Distal pulses are well felt. Neurological Exam :  Awake alert oriented 3 with normal speech and language function. No dysarthria or aphasia. She follows two-step commands well. Extraocular movements are full range without nystagmus. She blinks to threat bilaterally. Face is  symmetric. Tongue is midline. Motor system exam revealed no upper extremity drift but significant ataxia and left upper extremity. Left lower extremity drift with ligating the ground. Impaired coordination on the left. Touch pinprick sensation seems preserved bilaterally. Plantars are downgoing. Deep tendon results in a symmetric. Gait was not tested.   ASSESSMENT/PLA [name] is a 83 y.o. female with history of atrial fibrillation, type II diabetes mellitus, hyperlipidemia, hypertension, and two previous strokes while on Eliquis presenting with  left-sided weakness and incoordination. She did not receive IV t-PA due to being on Eliquis for anticoagulation.   Stroke:  New area of acute cerebral infarction involving the RIGHT lateral thalamus and posterior limb internal capsule, adjacent to the area of previous ischemia, likely cardioembolic secondary to thrombus in appendage apex  Resultant  Left hemiparesis and ataxia  CT head: No stroke  MRI head: New area of acute cerebral infarction involving the RIGHT lateral thalamus and posterior limb internal capsule, adjacent to the area of previous ischemia on the Jan 17, 2017 scan.  MRA head : No proximal vascular stenosis or occlusion.  CT Angio Head/Neck (01/17/2017): No large vessel occlusion.  Diffuse atherosclerosis without significant focal stenosis.  2D Echo (01/17/2017): Likely thrombus in the appendage apex with severely reduced LV emptying velocities    LDL 63 (01/18/2017)  HgbA1c 6 (01/18/2017)  SCDs for VTE prophylaxis Diet heart healthy/carb modified Room service appropriate? Yes; Fluid consistency: Thin Diet - low sodium heart healthy Diet Carb Modified  Eliquis (apixaban) daily prior to admission, now on aspirin 81 mg daily and Eliquis (apixaban) daily  Patient counseled to be compliant with her antithrombotic medications  Ongoing aggressive stroke risk factor management  Therapy recommendations:  CIR  Disposition:   Pending   Hypertension  Stable Permissive hypertension (OK if < 220/120) but gradually normalize in 5-7 days Long-term BP goal normotensive  Hyperlipidemia  Home meds: simvastatin 28m PO daily, now on atorvastatin 265mPO daily  LDL 63, goal < 70  Continue statin at discharge  Diabetes  HgbA1c 6, goal < 7.0  Controlled  Other Stroke Risk Factors  Advanced age  Overweight, Body mass index is 24.55 kg/m., recommend weight loss, diet and exercise as appropriate   Hx stroke/TIA  Other Active Problems  none  Hospital day # 2  I have personally examined this patient, reviewed notes, independently viewed imaging studies, participated in medical decision making and plan of care.ROS completed by me personally and pertinent positives fully documented  I have made any additions or clarifications directly to the above note. She has presented with right thalamic infarct secondary to atrial fibrillation despite anti-coagulation with eliquis.. Continue pradaxa. Physical occupational therapy and rehabilitation consults. Discussed with Dr. RiWynelle Cleveland Transfer to inpatient rehabilitation when bed available. Follow-up as an outpatient in stroke clinic in 6 weeks. Stroke team will sign off.. Antony ContrasMD Medical Director [facility]  Stroke Center Pager: [phone] 03/09/2017 1:27 PM   To contact Stroke Continuity provider, please refer to [email or web address] After hours, contact General Neurology

## 2017-03-09 NOTE — PMR Pre-admission (Signed)
PMR Admission Coordinator Pre-Admission Assessment  Patient: Christy Sutton is an 83 y.o., female MRN: [id number] DOB: 1926-06-24 Height: 5\' 4"  (162.6 cm) Weight: 64.9 kg (143 lb)              Insurance Information HMO:     PPO:      PCP:      IPA:      80/20: yes     OTHER: no HMO PRIMARY: Medicare a and b      Policy#: [id number]      Subscriber: pt Benefits:  Phone #: online     Name:  Eff. Date: 09/11/92 b 03/11/98     Deduct: $1340       Out of Pocket Max: none      Life Max: none CIR: 100%      SNF: 20 full days Outpatient: 80%     Co-Pay: 20% Home Health: 100%      Co-Pay: none DME: 80%     Co-Pay: 20% Providers: pt choice  SECONDARY: none      Medicaid Application Date:       Case Manager:  Disability Application Date:       Case Worker:   Emergency Contact Information Contact Information    Name Relation Home Work Mobile   Christy Sutton Other   [phone]   Christy Sutton   [phone]     Current Medical History  Patient Admitting Diagnosis:  Right CVA  History of Present Illness: HPI:  Christy Combsis a 83 y.o.femalewith history of DM diet controlled, HTN, CVA 4/18 and 5/18, A fib on eliquis who was admitted on 03/07/2017 with worsening of left sided weakness with numbness, left facial droop and concerns of stroke extension. MRI brain reviewed, showing right CVA. Per report, restricted diffusion in right posterior limb internal capsule and thalamus consistent with acute infarct adjacent to area of prior ischemia and no stenosis or occlusion. Dr. Leonie Man felt that stroke embolic likely due to A fib and recommended changing Eliquis to Pradaxa. Patient with resultant left sided weakness, cognitive deficits with no safety awareness and hard to redirect. CIR recommended due to deficits in mobility and decreased ability to carry out ADL tasks.   Total: 1 NIHSS   Past Medical History  Past Medical History:  Diagnosis Date  . Atrial fibrillation ([place])   . Diabetes mellitus  without complication ([place])   . Hyperlipidemia   . Hypertension   . TIA (transient ischemic attack)     Family History  family history includes Heart attack in her father.  Prior Rehab/Hospitalizations:  Has the patient had major surgery during 100 days prior to admission? No  Current Medications   Current Facility-Administered Medications:  .  0.9 %  sodium chloride infusion, , Intravenous, Continuous, Black, Lezlie Octave, NP, Last Rate: 10 mL/hr at 03/07/17 2101 .  acetaminophen (TYLENOL) tablet 650 mg, 650 mg, Oral, Q4H PRN **OR** acetaminophen (TYLENOL) solution 650 mg, 650 mg, Per Tube, Q4H PRN **OR** acetaminophen (TYLENOL) suppository 650 mg, 650 mg, Rectal, Q4H PRN, Black, Karen M, NP .  aspirin chewable tablet 81 mg, 81 mg, Oral, QHS, Black, Karen M, NP, 81 mg at 03/08/17 2153 .  atenolol (TENORMIN) tablet 25 mg, 25 mg, Oral, Daily, Rizwan, Saima, MD, 25 mg at 03/09/17 0932 .  atorvastatin (LIPITOR) tablet 20 mg, 20 mg, Oral, q1800, Black, Karen M, NP, 20 mg at 03/08/17 1848 .  dabigatran (PRADAXA) capsule 150 mg, 150 mg, Oral, Q12H, Leodis Sias, RPH, 150  mg at 03/09/17 0932 .  hydrALAZINE (APRESOLINE) injection 5 mg, 5 mg, Intravenous, Q6H PRN, Black, Karen M, NP .  insulin aspart (novoLOG) injection 0-9 Units, 0-9 Units, Subcutaneous, TID WC, Black, Karen M, NP .  meclizine (ANTIVERT) tablet 25 mg, 25 mg, Oral, BID PRN, Black, Karen M, NP .  senna-docusate (Senokot-S) tablet 1 tablet, 1 tablet, Oral, QHS PRN, Black, Lezlie Octave, NP  Patients Current Diet: Diet heart healthy/carb modified Room service appropriate? Yes; Fluid consistency: Thin Diet - low sodium heart healthy Diet Carb Modified  Precautions / Restrictions Precautions Precautions: Fall Restrictions Weight Bearing Restrictions: No   Has the patient had 2 or more falls or a fall with injury in the past year?No  Prior Activity Level Community (5-7x/wk): daughter in law, Christy Sutton drives pt and they go out alot. Patient  very active and completely independent prior to CVA 12/2016. Mows lawn, cooks, etc. Rather go out to eat though.Very Manufacturing engineer / [place] Devices/Equipment: Environmental consultant (specify type) Home Equipment: Shower seat, Environmental consultant - 2 wheels  Prior Device Use: Indicate devices/aids used by the patient prior to current illness, exacerbation or injury? Walker  Prior Functional Level Prior Function Level of Independence: Independent with assistive device(s) Comments: uses RW and cane  Self Care: Did the patient need help bathing, dressing, using the toilet or eating?  Independent  Indoor Mobility: Did the patient need assistance with walking from room to room (with or without device)? Independent  Stairs: Did the patient need assistance with internal or external stairs (with or without device)? Independent  Functional Cognition: Did the patient need help planning regular tasks such as shopping or remembering to take medications? Needed some help  Current Functional Level Cognition  Overall Cognitive Status: Impaired/Different from baseline Current Attention Level: Selective Orientation Level: Oriented X4 Following Commands: Follows one step commands with increased time Safety/Judgement: Decreased awareness of deficits, Decreased awareness of safety General Comments: Pt very easily distracted, tangential in conversation, attempts to pick up RW during ambulation (no safety awarenes) and require verbal and tactile cues. Pt also perseverated on mowing her grass and being out in the yard. Her hearing deficit also does not help our ability to communicate with her effectively and her impulsiveness.     Extremity Assessment (includes Sensation/Coordination)  Upper Extremity Assessment: Defer to OT evaluation LUE Deficits / Details: sensation grossly intact; grasp is 3/5, able to extend digits with increased time, elbow flexion 3/5, shoulder flexion to 75 degrees but unable  to maintain LUE Coordination: decreased fine motor, decreased gross motor  Lower Extremity Assessment: LLE deficits/detail LLE Deficits / Details: left leg with 0/5 ankle DF, 3/5 ankle PF, kne extension 3-/5, hip flexion 3-/5 LLE Coordination: decreased fine motor, decreased gross motor    ADLs  Overall ADL's : Needs assistance/impaired Eating/Feeding: Minimal assistance, Sitting Eating/Feeding Details (indicate cue type and reason): Pt will; need assist for Bil tasks like cutting Grooming: Minimal assistance, Sitting Grooming Details (indicate cue type and reason): assist for bilateral hand tasks and sequencing multi-step Upper Body Bathing: Minimal assistance, Sitting Lower Body Bathing: Moderate assistance, Sitting/lateral leans Upper Body Dressing : Minimal assistance, Sitting Lower Body Dressing: Moderate assistance, +2 for physical assistance, +2 for safety/equipment, Sit to/from stand Toilet Transfer: Moderate assistance, +2 for physical assistance, +2 for safety/equipment, Stand-pivot, BSC, RW Toilet Transfer Details (indicate cue type and reason): vc for safety, assist with weight shift and stability of LLE during transfer Toileting- Clothing Manipulation and Hygiene: Maximal assistance,  Sit to/from stand Functional mobility during ADLs: Moderate assistance, +2 for physical assistance, +2 for safety/equipment, Rolling walker, Cueing for safety, Cueing for sequencing    Mobility  General bed mobility comments: pt sitting OOB in recliner chair when therapist entered room    Transfers  Overall transfer level: Needs assistance Equipment used: Rolling walker (2 wheeled) Transfers: Sit to/from Stand Sit to Stand: +2 physical assistance, +2 safety/equipment, Mod assist General transfer comment: block LLE, and assist to support trunk during transitions, verbal cues for safe hand placement    Ambulation / Gait / Stairs / Wheelchair Mobility  Ambulation/Gait Ambulation/Gait  assistance: +2 physical assistance, Mod assist Ambulation Distance (Feet): 5 Feet Assistive device: Rolling walker (2 wheeled) Gait Pattern/deviations: Step-to pattern, Decreased step length - left, Decreased stance time - left, Decreased dorsiflexion - left, Staggering left General Gait Details: Pt with significant left leg weakness, assist to both stabilize and progress left leg with not great grip control of the left hand on the RW, second person attempting to support trunk and weight shift pt to the right.  Pt trying to pick up the walker to get it over the IV pole despite cues to keep the walker on the ground (not sure if she heard me) without awareness that this might not be safe.  Chair pulled behind for safety.   Gait velocity: decreased Gait velocity interpretation: Below normal speed for age/gender    Posture / Balance Balance Overall balance assessment: Needs assistance Sitting-balance support: Single extremity supported, Feet supported Sitting balance-Leahy Scale: Poor Standing balance support: Bilateral upper extremity supported, During functional activity Standing balance-Leahy Scale: Poor Standing balance comment: Two person heavy mod assist to stand and for gait with RW for support.     Special needs/care consideration BiPAP/CPAP  N/a CPM  N/a Continuous Drip IV  N/a Dialysis  N/a Life Vest  N/a Oxygen  N/a Special Bed  N/a Trach Size n/a Wound Vac   N/a Skin ecchymosis left hand Bowel mgmt: continent LBM 03/06/17 Bladder mgmt: continent Diabetic mgmt Hgb A1c 6   Previous Home Environment Living Arrangements: Other (Comment) (pt's 63 year old grandson, Mitzi Hansen, lives with pt. other famc)  Lives With: Other (Comment) Mitzi Hansen, grandson, lives with pt) Available Help at Discharge: Family, Available 24 hours/day Christy Sutton will arrnage 24/7 superivsion as needed) Type of Home: House Home Layout: One level Home Access: Stairs to enter Entrance Stairs-Rails: Right Entrance  Stairs-Number of Steps: 6 Bathroom Shower/Tub: Multimedia programmer: Standard Bathroom Accessibility: Yes How Accessible: Accessible via walker [place]: Yes Type of Home Care Services: Home RN, Home PT, [place] (if known): [facility]  Discharge Living Setting Plans for Discharge Living Setting: Patient's home Type of Home at Discharge: House Discharge Home Layout: One level Discharge Home Access: Stairs to enter Entrance Stairs-Rails: Right Entrance Stairs-Number of Steps: 6 Discharge Bathroom Shower/Tub: Walk-in shower Discharge Bathroom Toilet: Standard Discharge Bathroom Accessibility: Yes How Accessible: Accessible via walker Does the patient have any problems obtaining your medications?: No  Social/Family/Support Systems Patient Roles: Parent Contact Information: Christy Sutton and Gwyndolyn Saxon; son and daughter in law Anticipated Caregiver: Christy Sutton Anticipated Caregiver's Contact Information: see above Ability/Limitations of Caregiver: none Caregiver Availability: 24/7 Discharge Plan Discussed with Primary Caregiver: Yes Is Caregiver In Agreement with Plan?: Yes Does Caregiver/Family have Issues with Lodging/Transportation while Pt is in Rehab?: No   Very supportive family and will provide whatever is recommended.  Goals/Additional Needs Patient/Family Goal for Rehab: supervision to  min assist with PT and OT Expected length of stay: ELOS 14- 17 days Special Service Needs: HOH; Laurie to bring in her hearing aids Pt/Family Agrees to Admission and willing to participate: Yes Program Orientation Provided & Reviewed with Pt/Caregiver Including Roles  & Responsibilities: Yes   Decrease burden of Care through IP rehab admission: n/a  Possible need for SNF placement upon discharge:not anticipated  Patient Condition: This patient's condition remains as documented in the consult dated 03/09/2017, in which the Rehabilitation Physician  determined and documented that the patient's condition is appropriate for intensive rehabilitative care in an inpatient rehabilitation facility. Will admit to inpatient rehab today.  Preadmission Screen Completed By:  Cleatrice Burke, 03/09/2017 1:02 PM ______________________________________________________________________   Discussed status with Dr. Posey Pronto on 03/09/2017 at  1301 and received telephone approval for admission today.  Admission Coordinator:  Cleatrice Burke, time 7209 Date 03/09/2017

## 2017-03-09 NOTE — IPOC Note (Addendum)
Overall Plan of Care [facility]) Patient Details Name: Christy Sutton MRN: [id number] DOB: 1926/09/01  Admitting Diagnosis: CVA  Hospital Problems: Active Problems:   Stroke due to embolism ([place])   Acute blood loss anemia   Benign essential HTN   Hypokalemia   Hypoalbuminemia due to protein-calorie malnutrition ([facility])   Left hemiparesis ([place])     Functional Problem List: Nursing Medication Management, Endurance, Safety, Motor  PT Balance, Safety, Endurance, Motor, Perception  OT Balance, Safety, Behavior, Cognition, Endurance, Motor  SLP    TR         Basic ADL's: OT Grooming, Bathing, Dressing, Toileting     Advanced  ADL's: OT Simple Meal Preparation     Transfers: PT Bed Mobility, Bed to Chair, Car, Manufacturing systems engineer, Metallurgist: PT Stairs, Emergency planning/management officer, Ambulation     Additional Impairments: OT Fuctional Use of Upper Extremity  SLP        TR      Anticipated Outcomes Item Anticipated Outcome  Self Feeding N/A  Swallowing      Basic self-care  Supervision-Min A  Toileting  Min A   Bathroom Transfers Supervision   Bowel/Bladder  n/a  Transfers  supervision  Locomotion  min assist, household level ambulatory   Communication     Cognition     Pain  n/a  Safety/Judgment  Maintain safety with cues and reminders   Therapy Plan: PT Intensity: Minimum of 1-2 x/day ,45 to 90 minutes PT Frequency: 5 out of 7 days PT Duration Estimated Length of Stay: 21-24 days OT Intensity: Minimum of 1-2 x/day, 45 to 90 minutes OT Frequency: 5 out of 7 days OT Duration/Estimated Length of Stay: 22-24 days         Team Interventions: Nursing Interventions Patient/Family Education, Disease Management/Prevention, Discharge Planning, Medication Management  PT interventions Ambulation/gait training, Training and development officer, Cognitive remediation/compensation, Community reintegration, Discharge planning, DME/adaptive equipment instruction,  Functional electrical stimulation, Functional mobility training, Neuromuscular re-education, Patient/family education, Splinting/orthotics, Stair training, Therapeutic Activities, Therapeutic Exercise, UE/LE Strength taining/ROM, UE/LE Coordination activities, Wheelchair propulsion/positioning, Visual/perceptual remediation/compensation, Psychosocial support  OT Interventions Balance/vestibular training, Discharge planning, Self Care/advanced ADL retraining, Therapeutic Activities, UE/LE Coordination activities, Cognitive remediation/compensation, Functional electrical stimulation, Functional mobility training, Disease mangement/prevention, Patient/family education, Therapeutic Exercise, DME/adaptive equipment instruction, Neuromuscular re-education, Psychosocial support, UE/LE Strength taining/ROM, Wheelchair propulsion/positioning  SLP Interventions    TR Interventions    SW/CM Interventions Discharge Planning, Psychosocial Support, Patient/Family Education    Team Discharge Planning: Destination: PT-Home ,OT- Home , SLP-  Projected Follow-up: PT-Home health PT, 24 hour supervision/assistance, OT-  Other (comment) (HH vs. OP), SLP-  Projected Equipment Needs: PT-To be determined, OT- To be determined, SLP-  Equipment Details: PT- , OT-  Patient/family involved in discharge planning: PT- Patient,  OT-Patient, SLP-   MD ELOS: 21-24 days. Medical Rehab Prognosis:  Good Assessment:  Christy Sutton is a 83 y.o. female with history of DM diet controlled, HTN,  CVA 4/18 and 5/18, A fib on eliquis who was admitted on 03/07/2017 with worsening of left sided weakness with numbness, left facial droop and concerns of stroke extension. MRI brain showing restricted diffusion in right posterior limb internal capsule and thalamus consistent with acute infarct adjacent to area of prior ischemia and no stenosis or occlusion.  Dr. Leonie Man felt that stroke embolic likely due to A fib and recommended changing Eliquis to  Pradaxa. Patient with resultant left sided weakness, cognitive deficits with no safety awareness and hard to  redirect. Will set goals for Supervision/Min A with PT/OT.  See Team Conference Notes for weekly updates to the plan of care

## 2017-03-09 NOTE — Progress Notes (Signed)
Christy Gong, RN Rehab Admission Coordinator Signed Physical Medicine and Rehabilitation  PMR Pre-admission Date of Service: 03/09/2017 12:55 PM  Related encounter: ED to Hosp-Admission (Current) from 03/07/2017 in [place]       [] Hide copied text PMR Admission Coordinator Pre-Admission Assessment  Patient: Christy Sutton is an 83 y.o., female MRN: [id number] DOB: [birth year] Height: 5\' 4"  (162.6 cm) Weight: 64.9 kg (143 lb)                                                                                                                                                  Insurance Information HMO:     PPO:      PCP:      IPA:      80/20: yes     OTHER: no HMO PRIMARY: Medicare a and b      Policy#: [id number]      Subscriber: pt Benefits:  Phone #: online     Name:  Eff. Date: 09/11/92 b 03/11/98     Deduct: $1340       Out of Pocket Max: none      Life Max: none CIR: 100%      SNF: 20 full days Outpatient: 80%     Co-Pay: 20% Home Health: 100%      Co-Pay: none DME: 80%     Co-Pay: 20% Providers: pt choice  SECONDARY: none      Medicaid Application Date:       Case Manager:  Disability Application Date:       Case Worker:   Emergency Contact Information        Contact Information    Name Relation Home Work Mobile   Peterkin,Laurie Other   [phone]   Krithi, Bray   [phone]     Current Medical History  Patient Admitting Diagnosis:  Right CVA  History of Present Illness: HPI: Ermine Combsis a 83 y.o.femalewith history of DM diet controlled, HTN, CVA 4/18 and 5/18, A fib on eliquis who was admitted on 03/07/2017 with worsening of left sided weakness with numbness, left facial droop and concerns of stroke extension. MRI brain reviewed, showing right CVA. Per report, restricted diffusion in right posterior limb internal capsule and thalamus consistent with acute infarct adjacent to area of prior ischemia and no stenosis or  occlusion. Dr. Leonie Man felt that stroke embolic likely due to A fib and recommended changing Eliquis to Pradaxa. Patient with resultant left sided weakness, cognitive deficits with no safety awareness and hard to redirect. CIR recommended due to deficits in mobility and decreased ability to carry out ADL tasks.   Total: 1 NIHSS   Past Medical History      Past Medical History:  Diagnosis Date  . Atrial fibrillation ([place])   . Diabetes mellitus without complication ([place])   .  Hyperlipidemia   . Hypertension   . TIA (transient ischemic attack)     Family History  family history includes Heart attack in her father.  Prior Rehab/Hospitalizations:  Has the patient had major surgery during 100 days prior to admission? No  Current Medications   Current Facility-Administered Medications:  .  0.9 %  sodium chloride infusion, , Intravenous, Continuous, Black, Lezlie Octave, NP, Last Rate: 10 mL/hr at 03/07/17 2101 .  acetaminophen (TYLENOL) tablet 650 mg, 650 mg, Oral, Q4H PRN **OR** acetaminophen (TYLENOL) solution 650 mg, 650 mg, Per Tube, Q4H PRN **OR** acetaminophen (TYLENOL) suppository 650 mg, 650 mg, Rectal, Q4H PRN, Black, Karen M, NP .  aspirin chewable tablet 81 mg, 81 mg, Oral, QHS, Black, Karen M, NP, 81 mg at 03/08/17 2153 .  atenolol (TENORMIN) tablet 25 mg, 25 mg, Oral, Daily, Rizwan, Saima, MD, 25 mg at 03/09/17 0932 .  atorvastatin (LIPITOR) tablet 20 mg, 20 mg, Oral, q1800, Black, Karen M, NP, 20 mg at 03/08/17 1848 .  dabigatran (PRADAXA) capsule 150 mg, 150 mg, Oral, Q12H, Leodis Sias, RPH, 150 mg at 03/09/17 0932 .  hydrALAZINE (APRESOLINE) injection 5 mg, 5 mg, Intravenous, Q6H PRN, Black, Karen M, NP .  insulin aspart (novoLOG) injection 0-9 Units, 0-9 Units, Subcutaneous, TID WC, Black, Karen M, NP .  meclizine (ANTIVERT) tablet 25 mg, 25 mg, Oral, BID PRN, Black, Karen M, NP .  senna-docusate (Senokot-S) tablet 1 tablet, 1 tablet, Oral, QHS PRN, Black, Lezlie Octave,  NP  Patients Current Diet: Diet heart healthy/carb modified Room service appropriate? Yes; Fluid consistency: Thin Diet - low sodium heart healthy Diet Carb Modified  Precautions / Restrictions Precautions Precautions: Fall Restrictions Weight Bearing Restrictions: No   Has the patient had 2 or more falls or a fall with injury in the past year?No  Prior Activity Level Community (5-7x/wk): daughter in law, [name] drives pt and they go out alot. Patient very active and completely independent prior to CVA 12/2016. Mows lawn, cooks, etc. Rather go out to eat though.Very Manufacturing engineer / [place] Devices/Equipment: Environmental consultant (specify type) Home Equipment: Shower seat, Environmental consultant - 2 wheels  Prior Device Use: Indicate devices/aids used by the patient prior to current illness, exacerbation or injury? Walker  Prior Functional Level Prior Function Level of Independence: Independent with assistive device(s) Comments: uses RW and cane  Self Care: Did the patient need help bathing, dressing, using the toilet or eating?  Independent  Indoor Mobility: Did the patient need assistance with walking from room to room (with or without device)? Independent  Stairs: Did the patient need assistance with internal or external stairs (with or without device)? Independent  Functional Cognition: Did the patient need help planning regular tasks such as shopping or remembering to take medications? Needed some help  Current Functional Level Cognition  Overall Cognitive Status: Impaired/Different from baseline Current Attention Level: Selective Orientation Level: Oriented X4 Following Commands: Follows one step commands with increased time Safety/Judgement: Decreased awareness of deficits, Decreased awareness of safety General Comments: Pt very easily distracted, tangential in conversation, attempts to pick up RW during ambulation (no safety awarenes) and require  verbal and tactile cues. Pt also perseverated on mowing her grass and being out in the yard. Her hearing deficit also does not help our ability to communicate with her effectively and her impulsiveness.     Extremity Assessment (includes Sensation/Coordination)  Upper Extremity Assessment: Defer to OT evaluation LUE Deficits / Details: sensation grossly intact;  grasp is 3/5, able to extend digits with increased time, elbow flexion 3/5, shoulder flexion to 75 degrees but unable to maintain LUE Coordination: decreased fine motor, decreased gross motor  Lower Extremity Assessment: LLE deficits/detail LLE Deficits / Details: left leg with 0/5 ankle DF, 3/5 ankle PF, kne extension 3-/5, hip flexion 3-/5 LLE Coordination: decreased fine motor, decreased gross motor    ADLs  Overall ADL's : Needs assistance/impaired Eating/Feeding: Minimal assistance, Sitting Eating/Feeding Details (indicate cue type and reason): Pt will; need assist for Bil tasks like cutting Grooming: Minimal assistance, Sitting Grooming Details (indicate cue type and reason): assist for bilateral hand tasks and sequencing multi-step Upper Body Bathing: Minimal assistance, Sitting Lower Body Bathing: Moderate assistance, Sitting/lateral leans Upper Body Dressing : Minimal assistance, Sitting Lower Body Dressing: Moderate assistance, +2 for physical assistance, +2 for safety/equipment, Sit to/from stand Toilet Transfer: Moderate assistance, +2 for physical assistance, +2 for safety/equipment, Stand-pivot, BSC, RW Toilet Transfer Details (indicate cue type and reason): vc for safety, assist with weight shift and stability of LLE during transfer Toileting- Clothing Manipulation and Hygiene: Maximal assistance, Sit to/from stand Functional mobility during ADLs: Moderate assistance, +2 for physical assistance, +2 for safety/equipment, Rolling walker, Cueing for safety, Cueing for sequencing    Mobility  General bed mobility  comments: pt sitting OOB in recliner chair when therapist entered room    Transfers  Overall transfer level: Needs assistance Equipment used: Rolling walker (2 wheeled) Transfers: Sit to/from Stand Sit to Stand: +2 physical assistance, +2 safety/equipment, Mod assist General transfer comment: block LLE, and assist to support trunk during transitions, verbal cues for safe hand placement    Ambulation / Gait / Stairs / Wheelchair Mobility  Ambulation/Gait Ambulation/Gait assistance: +2 physical assistance, Mod assist Ambulation Distance (Feet): 5 Feet Assistive device: Rolling walker (2 wheeled) Gait Pattern/deviations: Step-to pattern, Decreased step length - left, Decreased stance time - left, Decreased dorsiflexion - left, Staggering left General Gait Details: Pt with significant left leg weakness, assist to both stabilize and progress left leg with not great grip control of the left hand on the RW, second person attempting to support trunk and weight shift pt to the right.  Pt trying to pick up the walker to get it over the IV pole despite cues to keep the walker on the ground (not sure if she heard me) without awareness that this might not be safe.  Chair pulled behind for safety.   Gait velocity: decreased Gait velocity interpretation: Below normal speed for age/gender    Posture / Balance Balance Overall balance assessment: Needs assistance Sitting-balance support: Single extremity supported, Feet supported Sitting balance-Leahy Scale: Poor Standing balance support: Bilateral upper extremity supported, During functional activity Standing balance-Leahy Scale: Poor Standing balance comment: Two person heavy mod assist to stand and for gait with RW for support.     Special needs/care consideration BiPAP/CPAP  N/a CPM  N/a Continuous Drip IV  N/a Dialysis  N/a Life Vest  N/a Oxygen  N/a Special Bed  N/a Trach Size n/a Wound Vac   N/a Skin ecchymosis left hand Bowel mgmt:  continent LBM 03/06/17 Bladder mgmt: continent Diabetic mgmt Hgb A1c 6   Previous Home Environment Living Arrangements: Other (Comment) (pt's 68 year old grandson, Mitzi Hansen, lives with pt. other famc)  Lives With: Other (Comment) Mitzi Hansen, grandson, lives with pt) Available Help at Discharge: Family, Available 24 hours/day [name] will arrnage 24/7 superivsion as needed) Type of Home: House Home Layout: One level Home Access: Stairs to  enter Entrance Stairs-Rails: Right Entrance Stairs-Number of Steps: 6 Bathroom Shower/Tub: Multimedia programmer: Standard Bathroom Accessibility: Yes How Accessible: Accessible via walker Home Care Services: Yes Type of Home Care Services: Home RN, Home PT, [place] (if known): [facility]  Discharge Living Setting Plans for Discharge Living Setting: Patient's home Type of Home at Discharge: House Discharge Home Layout: One level Discharge Home Access: Stairs to enter Entrance Stairs-Rails: Right Entrance Stairs-Number of Steps: 6 Discharge Bathroom Shower/Tub: Walk-in shower Discharge Bathroom Toilet: Standard Discharge Bathroom Accessibility: Yes How Accessible: Accessible via walker Does the patient have any problems obtaining your medications?: No  Social/Family/Support Systems Patient Roles: Parent Contact Information: [name] and Gwyndolyn Saxon; son and daughter in law Anticipated Caregiver: [name] Anticipated Caregiver's Contact Information: see above Ability/Limitations of Caregiver: none Caregiver Availability: 24/7 Discharge Plan Discussed with Primary Caregiver: Yes Is Caregiver In Agreement with Plan?: Yes Does Caregiver/Family have Issues with Lodging/Transportation while Pt is in Rehab?: No   Very supportive family and will provide whatever is recommended.  Goals/Additional Needs Patient/Family Goal for Rehab: supervision to min assist with PT and OT Expected length of stay: [facility] 14- 17  days Special Service Needs: HOH; Laurie to bring in her hearing aids Pt/Family Agrees to Admission and willing to participate: Yes Program Orientation Provided & Reviewed with Pt/Caregiver Including Roles  & Responsibilities: Yes   Decrease burden of Care through IP rehab admission: n/a  Possible need for SNF placement upon discharge:not anticipated  Patient Condition: This patient's condition remains as documented in the consult dated 03/09/2017, in which the Rehabilitation Physician determined and documented that the patient's condition is appropriate for intensive rehabilitative care in an inpatient rehabilitation facility. Will admit to inpatient rehab today.  Preadmission Screen Completed By:  Cleatrice Burke, 03/09/2017 1:02 PM ______________________________________________________________________   Discussed status with Dr. Posey Pronto on 03/09/2017 at  1301 and received telephone approval for admission today.  Admission Coordinator:  Cleatrice Burke, time 6153 Date 03/09/2017       Cosigned by: [name], MD at 03/09/2017 1:07 PM  Revision History

## 2017-03-10 ENCOUNTER — Inpatient Hospital Stay (HOSPITAL_COMMUNITY): Payer: Medicare Other | Admitting: Occupational Therapy

## 2017-03-10 ENCOUNTER — Encounter (HOSPITAL_COMMUNITY): Payer: Self-pay

## 2017-03-10 ENCOUNTER — Inpatient Hospital Stay (HOSPITAL_COMMUNITY): Payer: Medicare Other | Admitting: Physical Therapy

## 2017-03-10 DIAGNOSIS — I10 Essential (primary) hypertension: Secondary | ICD-10-CM

## 2017-03-10 DIAGNOSIS — I63411 Cerebral infarction due to embolism of right middle cerebral artery: Secondary | ICD-10-CM

## 2017-03-10 DIAGNOSIS — E46 Unspecified protein-calorie malnutrition: Secondary | ICD-10-CM

## 2017-03-10 DIAGNOSIS — E876 Hypokalemia: Secondary | ICD-10-CM

## 2017-03-10 DIAGNOSIS — E8809 Other disorders of plasma-protein metabolism, not elsewhere classified: Secondary | ICD-10-CM

## 2017-03-10 DIAGNOSIS — D62 Acute posthemorrhagic anemia: Secondary | ICD-10-CM

## 2017-03-10 LAB — COMPREHENSIVE METABOLIC PANEL
ALT: 11 U/L — AB (ref 14–54)
AST: 23 U/L (ref 15–41)
Albumin: 2.9 g/dL — ABNORMAL LOW (ref 3.5–5.0)
Alkaline Phosphatase: 42 U/L (ref 38–126)
Anion gap: 7 (ref 5–15)
BUN: 9 mg/dL (ref 6–20)
CHLORIDE: 107 mmol/L (ref 101–111)
CO2: 25 mmol/L (ref 22–32)
CREATININE: 0.76 mg/dL (ref 0.44–1.00)
Calcium: 8.5 mg/dL — ABNORMAL LOW (ref 8.9–10.3)
GFR calc Af Amer: >60 mL/min (ref >60)
GFR calc non Af Amer: >60 mL/min (ref >60)
GLUCOSE: 107 mg/dL — AB (ref 65–99)
POTASSIUM: 3.3 mmol/L — AB (ref 3.5–5.1)
SODIUM: 139 mmol/L (ref 135–145)
Total Bilirubin: 0.9 mg/dL (ref 0.3–1.2)
Total Protein: 5.7 g/dL — ABNORMAL LOW (ref 6.5–8.1)

## 2017-03-10 LAB — CBC WITH DIFFERENTIAL/PLATELET
BASOS ABS: 0 10*3/uL (ref 0.0–0.1)
BASOS PCT: 0 %
EOS ABS: 0.4 10*3/uL (ref 0.0–0.7)
EOS PCT: 7 %
HCT: 34.3 % — ABNORMAL LOW (ref 36.0–46.0)
Hemoglobin: 11.4 g/dL — ABNORMAL LOW (ref 12.0–15.0)
LYMPHS PCT: 14 %
Lymphs Abs: 0.9 10*3/uL (ref 0.7–4.0)
MCH: 29.6 pg (ref 26.0–34.0)
MCHC: 33.2 g/dL (ref 30.0–36.0)
MCV: 89.1 fL (ref 78.0–100.0)
Monocytes Absolute: 0.6 10*3/uL (ref 0.1–1.0)
Monocytes Relative: 9 %
Neutro Abs: 4.4 10*3/uL (ref 1.7–7.7)
Neutrophils Relative %: 70 %
PLATELETS: 162 10*3/uL (ref 150–400)
RBC: 3.85 MIL/uL — AB (ref 3.87–5.11)
RDW: 13.2 % (ref 11.5–15.5)
WBC: 6.4 10*3/uL (ref 4.0–10.5)

## 2017-03-10 MED ORDER — PRO-STAT SUGAR FREE PO LIQD
30.0000 mL | Freq: Two times a day (BID) | ORAL | Status: DC
  Administered 2017-03-10 – 2017-03-29 (×38): 30 mL via ORAL
  Filled 2017-03-10 (×41): qty 30

## 2017-03-10 MED ORDER — POTASSIUM CHLORIDE CRYS ER 20 MEQ PO TBCR
30.0000 meq | EXTENDED_RELEASE_TABLET | Freq: Two times a day (BID) | ORAL | Status: AC
  Administered 2017-03-10 (×2): 30 meq via ORAL
  Filled 2017-03-10 (×2): qty 1

## 2017-03-10 NOTE — Progress Notes (Signed)
[facility] PHYSICAL MEDICINE & REHABILITATION     PROGRESS NOTE  Subjective/Complaints:  Pt seen laying in bed this AM.  She slept well overnight.  She is ready to being therapies.   ROS: Denies CP, SOB, N/V/D.  Objective: Vital Signs: Blood pressure (!) 162/62, pulse 80, temperature 98.3 F (36.8 C), temperature source Oral, resp. rate 18, weight 61.6 kg (135 lb 14.4 oz), SpO2 97 %. No results found.  Recent Labs  03/07/17 1344 03/07/17 1350 03/10/17 0404  WBC 6.9  --  6.4  HGB 11.2* 10.2* 11.4*  HCT 33.1* 30.0* 34.3*  PLT 157  --  162    Recent Labs  03/09/17 0644 03/10/17 0404  NA 140 139  K 3.6 3.3*  CL 108 107  GLUCOSE 98 107*  BUN 6 9  CREATININE 0.70 0.76  CALCIUM 8.4* 8.5*   CBG (last 3)   Recent Labs  03/08/17 2150 03/09/17 0649 03/09/17 1155  GLUCAP 119* 97 141*    Wt Readings from Last 3 Encounters:  03/09/17 61.6 kg (135 lb 14.4 oz)  03/07/17 64.9 kg (143 lb)  01/18/17 67.6 kg (149 lb 0.5 oz)    Physical Exam:  BP (!) 162/62 (BP Location: Left Arm) Comment: rn notified  Pulse 80   Temp 98.3 F (36.8 C) (Oral)   Resp 18   Wt 61.6 kg (135 lb 14.4 oz)   SpO2 97%   BMI 23.33 kg/m  Constitutional: She appears well-developed and well-nourished.  HENT: Normocephalic and atraumatic.  Eyes: EOM are normal. No discharge.   Cardiovascular: Normal rate and regular rhythm.  No JVD. Respiratory: Effort normal and breath sounds normal.  GI: Soft. Bowel sounds are normal.   Musculoskeletal: She exhibits no edema or tenderness.  Neurological: She is alert and oriented.  HOH.  Minimal dysarthria.  Motor: RUE/RLE: 5/5 proximal to distal LUE: 4+/5 with apraxia LLE: HF 4/5, KE 3-/5, ADF/PF 2-/5 Skin: Skin is warm and dry.  Psychiatric: She has a normal mood and affect. Her behavior is normal. Thought content normal.    Assessment/Plan: 1. Functional deficits secondary to right CVA which require 3+ hours per day of interdisciplinary therapy in a  comprehensive inpatient rehab setting. Physiatrist is providing close team supervision and 24 hour management of active medical problems listed below. Physiatrist and rehab team continue to assess barriers to discharge/monitor patient progress toward functional and medical goals.  Function:  Bathing Bathing position      Bathing parts      Bathing assist        Upper Body Dressing/Undressing Upper body dressing                    Upper body assist        Lower Body Dressing/Undressing Lower body dressing                                  Lower body assist        Toileting Toileting          Toileting assist     Transfers Chair/bed transfer             Locomotion Ambulation           Wheelchair          Cognition Comprehension    Expression    Social Interaction    Problem Solving    Memory  Medical Problem List and Plan: 1.   Left sided weakness, cognitive deficits with no safety awareness and hard to redirect secondary to right CVA on 6/27.  Begin CIR  2.  DVT Prophylaxis/Anticoagulation: Pharmaceutical: Pradexa 3. Pain Management: tylenol prn 4. Mood: LCSW to follow for evaluation and support 5. Neuropsych: This patient is capable of making decisions on her own behalf. 6. Skin/Wound Care: routine pressure relief measures.  7. Fluids/Electrolytes/Nutrition: Monitor I/O.  8. Anemia:   Hb 11.4 on 6/30  Cont to monitor 9. DM diet controlled: Hgb A1c- 6.0.  BS stable overall will check BS bid with SSI as needed. Poor intake--d/ced cardiac restrictions and continue CM diet.  10. HTN: monitor BP bid and avoid hypotension.  Continue atenolol daily.   Monitor with increased mobility, will normalize in the coming days 11. Hypokalemia  K+ 3.3 on 6/30  Will supplement x1 day 12. Hypoalbuminemia  Supplement initiated 6/30  LOS (Days) 1 A FACE TO FACE EVALUATION WAS PERFORMED  Firman Petrow Lorie Phenix 03/10/2017 9:53 AM

## 2017-03-10 NOTE — Evaluation (Signed)
Occupational Therapy Assessment and Plan  Patient Details  Name: Christy Sutton MRN: [id number] Date of Birth: Dec 05, 1925  OT Diagnosis: abnormal posture, cognitive deficits, hemiplegia affecting non-dominant side, muscle weakness (generalized) and coordination disorder Rehab Potential: Rehab Potential (ACUTE ONLY): Good ELOS: 22-24 days   Today's Date: 03/10/2017 OT Individual Time: 0623-7628 and 1257-1330 OT Individual Time Calculation (min): 64 min  And 33 min  Problem List:  Patient Active Problem List   Diagnosis Date Noted  . Acute blood loss anemia   . Benign essential HTN   . Hypokalemia   . Hypoalbuminemia due to protein-calorie malnutrition ([place])   . Stroke due to embolism ([place]) 03/09/2017  . Diabetes mellitus type 2 in nonobese (H[facility])   . History of CVA (cerebrovascular accident)   . Cerebral embolism with cerebral infarction 03/07/2017  . Stroke ([place]) 01/17/2017  . TIA (transient ischemic attack) 01/17/2017  . Dysmetria 01/17/2017  . Essential hypertension 01/17/2017  . Diabetes mellitus with complication ([place]) 31/51/7616  . Permanent atrial fibrillation ([place]) 01/17/2017  . Ischemic stroke [facility])     Past Medical History:  Past Medical History:  Diagnosis Date  . Atrial fibrillation ([place])   . Diabetes mellitus without complication ([place])   . Hyperlipidemia   . Hypertension   . TIA (transient ischemic attack)    Past Surgical History:  Past Surgical History:  Procedure Laterality Date  . ABDOMINAL HYSTERECTOMY    . TEE WITHOUT CARDIOVERSION N/A 01/30/2017   Procedure: TRANSESOPHAGEAL ECHOCARDIOGRAM (TEE);  Surgeon: Adrian Prows, MD;  Location: [facility] ENDOSCOPY;  Service: Cardiovascular;  Laterality: N/A;  . TUBAL LIGATION      Assessment & Plan Clinical Impression: Christy Sutton a 83 y.o.femalewith history of DM diet controlled, HTN, CVA 4/18 and 5/18, A fib on eliquis who was admitted on 03/07/2017 with worsening of left sided weakness with numbness, left facial  droop and concerns of stroke extension. MRI brain reviewed, showing right CVA. Per report, restricted diffusion in right posterior limb internal capsule and thalamus consistent with acute infarct adjacent to area of prior ischemia and no stenosis or occlusion. Dr. Leonie Man felt that stroke embolic likely due to A fib and recommended changing Eliquis to Pradaxa. Patient with resultant left sided weakness, cognitive deficits with no safety awareness and hard to redirect. CIR recommended due to deficits in mobility and decreased ability to carry out ADL tasks.   Patient currently requires max with basic self-care skills secondary to muscle weakness, decreased cardiorespiratoy endurance, impaired timing and sequencing, abnormal tone, unbalanced muscle activation and decreased coordination, decreased awareness, decreased safety awareness and decreased memory and decreased sitting balance, decreased standing balance, decreased postural control, hemiplegia and decreased balance strategies.  Prior to hospitalization, patient could complete BADLs with supervision.  Patient will benefit from skilled intervention to increase independence with basic self-care skills prior to discharge home with grandson.  Anticipate patient will require 24 hour supervision and follow up home health and follow up outpatient.  OT - End of Session Endurance Deficit: Yes OT Assessment Rehab Potential (ACUTE ONLY): Good Barriers to Discharge: Inaccessible home environment (w/c cannot fit inside of bathroom) OT Patient demonstrates impairments in the following area(s): Balance;Safety;Behavior;Cognition;Endurance;Motor OT Basic ADL's Functional Problem(s): Grooming;Bathing;Dressing;Toileting OT Advanced ADL's Functional Problem(s): Simple Meal Preparation OT Transfers Functional Problem(s): Toilet;Tub/Shower OT Additional Impairment(s): Fuctional Use of Upper Extremity OT Plan OT Intensity: Minimum of 1-2 x/day, 45 to 90 minutes OT  Frequency: 5 out of 7 days OT Duration/Estimated Length of Stay: 22-24 days OT Treatment/Interventions: Medical illustrator  training;Discharge planning;Self Care/advanced ADL retraining;Therapeutic Activities;UE/LE Coordination activities;Cognitive remediation/compensation;Functional electrical stimulation;Functional mobility training;Disease mangement/prevention;Patient/family education;Therapeutic Exercise;DME/adaptive equipment instruction;Neuromuscular re-education;Psychosocial support;UE/LE Strength taining/ROM;Wheelchair propulsion/positioning OT Self Feeding Anticipated Outcome(s): N/A OT Basic Self-Care Anticipated Outcome(s): Supervision-Min A OT Toileting Anticipated Outcome(s): Min A OT Bathroom Transfers Anticipated Outcome(s): Supervision  OT Recommendation Recommendations for Other Services: Speech consult;Therapeutic Recreation consult Therapeutic Recreation Interventions: Pet therapy;Kitchen group Patient destination: Home Follow Up Recommendations: Other (comment) (HH vs. OP) Equipment Recommended: To be determined   Skilled Therapeutic Intervention Skilled OT session completed with focus on initial evaluation, education regarding OT role/POC, and establishment of patient-centered goals.   Pt greeted supine in bed, HOH but agreeable to (and anticipating) OT. Pt initiating guiding L LE off of bed with use of R LE ("wait sugar- got to help my bum leg"). Pt requiring overall Max A for supine<sit but tried as much as she could during transfer. She declined showering, opting instead for cold sponge bathe (per home setup). Pt frequently losing balance at EOB without unilateral UE support. Pt requiring Mod-Max A for dynamic balance maintenance. Pt unable to achieve full stand, though she was provided with vcs and manual cues to facilitate upright postural alignment. Pericare/lifting pants over hips completed in squat-standing positions (Max A sit<stand, Lt knee blocked due to buckling). Pt  requiring overall Max A for bathing/dressing due to L hemiparesis and balance deficits. Pt able to initiate using L UE as gross assist, but required assist from OT to use it functionally with [facility]. Pt practicing squat pivot transfer to [facility], requiring Max A, Lt knee blocked and L UE grip facilitated on rail/bars as needed. At end of tx pt requested to return to bed, engaged in interview questions with OT positioned close to face, and carefully enunciating words (pt visibly lip reads). At end of tx pt was oriented to therapy schedule and left with all needs within reach. Positioned in bed to protect hemiplegic side. Bed alarm activated.   2nd Session 1:1 tx (33 min) Tx focus on L UE NMR and functional transfers during self care tasks.   Pt greeted in w/c, just finished lunch. Pt completing oral care w/c level at sink, HOH for using L UE as functional gross assist/stabilizer. Pt using Lt hand to uncap/cap toothpaste. Pt brushing hair with setup, attending to bilateral aspects of head. Then instructed her on use of yellow theraputty exercises to complete in room to improve L UE strength/coordination. Visual cues provided for exercise technique carryover due to hearing deficits. Pt then transferred back to bed to Lt side with Max A. Pt assisted with boosting herself up in bed. Pt left with all needs within reach at time of departure. Bed alarm activated.   OT Evaluation Precautions/Restrictions  Precautions Precautions: Fall Precaution Comments: L hemiparesis, impulsive, HOH Restrictions Weight Bearing Restrictions: No General Chart Reviewed: Yes Family/Caregiver Present: No Pain No c/o pain during tx    Home Living/Prior Functioning Home Living Available Help at Discharge: Available 24 hours/day, Available PRN/intermittently (Dtr [name] can arrange 24/7 supervision if recommended  (per admission rehab documentation)) Type of Home: House Home Access: Stairs to enter [employer] of Steps:  6 Entrance Stairs-Rails: Right Home Layout: One level Bathroom Shower/Tub: Walk-in shower (with seat) Bathroom Toilet: Standard (Flanked by supportive counter/sink) Bathroom Accessibility: Yes Additional Comments:  (Per pt, w/c can fit at entrance of bathroom and she can pivot to toilet from there)  Lives With: Other (Comment) (30 y/o grandson) IADL History Homemaking Responsibilities: Yes (prior to CVA  4/18) Meal Prep Responsibility: Primary Laundry Responsibility: Primary Cleaning Responsibility: Primary Shopping Responsibility: No 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/family Prior Function Level of Independence: Requires assistive device for independence, Independent with basic ADLs  Able to [place]?: Yes Driving: No (No , Dtr drove pt) Vocation: Retired ADL ADL ADL Comments: Please see functional navigator for ADL status Vision Baseline Vision/History: Wears glasses Wears Glasses: Reading only Patient Visual Report: No change from baseline Vision Assessment?: No apparent visual deficits Perception  Perception: Within Functional Limits (pt able to initiate: guiding L LE off of bed;, using L UE during bathing, and washing L LE with cues to wash LB ) Praxis Praxis: Intact Praxis-Other Comments: Able to initiate/sequence BADL tasks during eval without apparent challenges relating to praxis Cognition Overall Cognitive Status: No family/caregiver present to determine baseline cognitive functioning Arousal/Alertness: Awake/alert Orientation Level: Person;Place;Situation Person: Oriented Place: Oriented Situation: Oriented Year: 2018 Month: June ("Either June 30th or the first of July") Day of Week: Correct Memory: Impaired Memory Impairment: Decreased recall of new information Immediate Memory Recall: Sock;Blue;Bed Memory Recall: Bed Memory Recall Bed: Without Cue Attention:  Sustained Sustained Attention: Appears intact Awareness: Impaired Awareness Impairment: Emergent impairment;Anticipatory impairment Problem Solving: Impaired Behaviors: Impulsive;Other (comment) ([place] ) Safety/Judgment: Impaired Comments: Pt requiring safety cuing during functional transfers and BADL tasks Sensation Sensation Light Touch: Appears Intact Stereognosis: Not tested Hot/Cold: Appears Intact Proprioception: Impaired Detail Proprioception Impaired Details: Impaired LUE Coordination Gross Motor Movements are Fluid and Coordinated: No Fine Motor Movements are Fluid and Coordinated: No Motor  Motor Motor: Hemiplegia;Abnormal postural alignment and control Mobility  Transfers Transfers: Sit to Stand;Stand to Sit (squat-stand) Sit to Stand: 2: Max assist Stand to Sit: 2: Max assist Stand to Sit Details: during BADL completion  Trunk/Postural Assessment  Cervical Assessment Cervical Assessment: Within Functional Limits Thoracic Assessment Thoracic Assessment: Within Functional Limits Lumbar Assessment Lumbar Assessment: Within Functional Limits Postural Control Postural Control: Deficits on evaluation (trunk weakness, losing balance EOB, unable to achieve full standing position during eval)  Balance Balance Balance Assessed: Yes Dynamic Sitting Balance Dynamic Sitting - Level of Assistance: 2: Max assist (during LB self care tasks, pt tending to lose balance laterally) Dynamic Standing Balance Dynamic Standing - Level of Assistance: 2: Max assist Dynamic Standing - Comments: Pt completing squat-stand for pericare/managing LB clothing, requiring Max A Extremity/Trunk Assessment RUE Assessment RUE Assessment: Within Functional Limits LUE Assessment LUE Assessment: Exceptions to [facility] LUE Tone LUE Tone: Flaccid;Moderate LUE Tone Comments: Pt able to initiate use as gross assist during BADLs   See Function Navigator for Current Functional Status.   Refer to  Care Plan for Long Term Goals  Recommendations for other services: Therapeutic Recreation  Pet therapy and Kitchen group and Other: SLP   Discharge Criteria: Patient will be discharged from OT if patient refuses treatment 3 consecutive times without medical reason, if treatment goals not met, if there is a change in medical status, if patient makes no progress towards goals or if patient is discharged from hospital.  The above assessment, treatment plan, treatment alternatives and goals were discussed and mutually agreed upon: by patient  Skeet Simmer 03/10/2017, 8:03 PM

## 2017-03-10 NOTE — Progress Notes (Signed)
Physical Therapy Session Note  Patient Details  Name: Christy Sutton MRN: [id number] Date of Birth: 11-Sep-1926  Today's Date: 03/10/2017 PT Individual Time: 1400-1419 PT Individual Time Calculation (min): 19 min  and Today's Date: 03/10/2017 PT Missed Time: 11 Minutes Missed Time Reason: Patient fatigue  Short Term Goals: Week 1:  PT Short Term Goal 1 (Week 1): pt will demonstrate dynamic sitting during a functional task with min assist PT Short Term Goal 2 (Week 1): pt will ambulate 39' with LRAD PT Short Term Goal 3 (Week 1): pt will transfer with consistent min assist  PT Short Term Goal 4 (Week 1): pt will demonstrate emergent awareness with mod cues   Skilled Therapeutic Interventions/Progress Updates:  No c/o pain but reports fatigue and having just returned to bed.  Agreeable to trial new w/c cushion and adjust fit of leg rests.  Pt does not recall this therapist from AM session but does recall stair negotiation and ambulation.  Pt comes supine>sit using bed rail and HOB minimally elevated with min assist to maintain balance during weight shift to come fully upright.  Squat/pivot to w/c on R with light mod assist for lifting and assist required to scoot back in w/c 2/2 increased STF height with cushion.  Pt returned to bed at end of session, impulsive with squat/pivot requiring max>total assist to prevent forward slide off EOB.  Sit>supine with min assist for LLE.  Pt positioned to comfort with call bell in reach and needs met.   Therapy Documentation Precautions:  Precautions Precautions: Fall Precaution Comments: L hemiparesis, decreased safety awareness, impulsive Restrictions Weight Bearing Restrictions: No   See Function Navigator for Current Functional Status.   Therapy/Group: Individual Therapy  Earnest Conroy Penven[place]Crew 03/10/2017, 2:22 PM

## 2017-03-10 NOTE — Evaluation (Signed)
Physical Therapy Assessment and Plan  Patient Details  Name: Christy Sutton MRN: [id number] Date of Birth: 13-Aug-1926  PT Diagnosis: Abnormal posture, Coordination disorder, Hemiparesis non-dominant, Impaired cognition and Muscle weakness Rehab Potential: Fair ELOS: 21-24 days   Today's Date: 03/10/2017 PT Individual Time: 1045-1200 PT Individual Time Calculation (min): 75 min    Problem List:  Patient Active Problem List   Diagnosis Date Noted  . Acute blood loss anemia   . Benign essential HTN   . Hypokalemia   . Hypoalbuminemia due to protein-calorie malnutrition ([address])   . Stroke due to embolism ([place]) 03/09/2017  . Diabetes mellitus type 2 in nonobese (HCC)   . History of CVA (cerebrovascular accident)   . Cerebral embolism with cerebral infarction 03/07/2017  . Stroke ([place]) 01/17/2017  . TIA (transient ischemic attack) 01/17/2017  . Dysmetria 01/17/2017  . Essential hypertension 01/17/2017  . Diabetes mellitus with complication ([place]) [id number]  . Permanent atrial fibrillation ([place]) 01/17/2017  . Ischemic stroke [facility])     Past Medical History:  Past Medical History:  Diagnosis Date  . Atrial fibrillation ([place])   . Diabetes mellitus without complication ([place])   . Hyperlipidemia   . Hypertension   . TIA (transient ischemic attack)    Past Surgical History:  Past Surgical History:  Procedure Laterality Date  . ABDOMINAL HYSTERECTOMY    . TEE WITHOUT CARDIOVERSION N/A 01/30/2017   Procedure: TRANSESOPHAGEAL ECHOCARDIOGRAM (TEE);  Surgeon: Adrian Prows, MD;  Location: [place];  Service: Cardiovascular;  Laterality: N/A;  . TUBAL LIGATION      Assessment & Plan Clinical Impression:  Christy Sutton is a 83 y.o. female with history of DM diet controlled, HTN,  CVA 4/18 and 5/18, A fib on eliquis who was admitted on 03/07/2017 with worsening of left sided weakness with numbness, left facial droop and concerns of stroke extension. MRI brain reviewed, showing right CVA.  Per  report, restricted diffusion in right posterior limb internal capsule and thalamus consistent with acute infarct adjacent to area of prior ischemia and no stenosis or occlusion.  Dr. Leonie Man felt that stroke embolic likely due to A fib and recommended changing Eliquis to Pradaxa. Patient with resultant left sided weakness, cognitive deficits with no safety awareness and hard to redirect. CIR recommended due to deficits in mobility and decreased ability to carry out ADL tasks.   Patient transferred to CIR on 03/09/2017 .   Patient currently requires mod>total assist with mobility secondary to muscle weakness, abnormal tone, unbalanced muscle activation and decreased coordination, decreased midline orientation and decreased attention to left, decreased awareness, decreased problem solving, decreased safety awareness, decreased memory and delayed processing and decreased sitting balance, decreased standing balance, decreased postural control, hemiplegia and decreased balance strategies.  Prior to hospitalization, patient was modified independent  with mobility and lived with Other (Comment) (grandson) in a House home.  Home access is 6Stairs to enter.  Patient will benefit from skilled PT intervention to maximize safe functional mobility, minimize fall risk and decrease caregiver burden for planned discharge home with 24 hour assist.  Anticipate patient will benefit from follow up [facility] at discharge.  PT - End of Session Activity Tolerance: Tolerates 30+ min activity with multiple rests Endurance Deficit: Yes PT Assessment Rehab Potential (ACUTE/IP ONLY): Fair Barriers to Discharge: Decreased caregiver support;Inaccessible home environment PT Patient demonstrates impairments in the following area(s): Balance;Safety;Endurance;Motor;Perception PT Transfers Functional Problem(s): Bed Mobility;Bed to Chair;Car;Furniture PT Locomotion Functional Problem(s): Stairs;Wheelchair Mobility;Ambulation PT Plan PT  Intensity: Minimum of 1-2  x/day ,45 to 90 minutes PT Frequency: 5 out of 7 days PT Duration Estimated Length of Stay: 21-24 days PT Treatment/Interventions: Ambulation/gait training;Balance/vestibular training;Cognitive remediation/compensation;Community reintegration;Discharge planning;DME/adaptive equipment instruction;Functional electrical stimulation;Functional mobility training;Neuromuscular re-education;Patient/family education;Splinting/orthotics;Stair training;Therapeutic Activities;Therapeutic Exercise;UE/LE Strength taining/ROM;UE/LE Coordination activities;Wheelchair propulsion/positioning;Visual/perceptual remediation/compensation;Psychosocial support PT Transfers Anticipated Outcome(s): supervision PT Locomotion Anticipated Outcome(s): min assist, household level ambulatory  PT Recommendation Follow Up Recommendations: Home health PT;24 hour supervision/assistance Patient destination: Home Equipment Recommended: To be determined  Skilled Therapeutic Intervention No c/o pain.  Session focus on pt education in PT goals, expected progression, ELOS, falls precautions, and PT evaluation.  Pt currently performing all mobility at overall mod/max assist and pt demonstrates significant impairments in cognition and mobility noted below.  Returned to room at end of session and positioned with QRB in place, call bell in reach and needs met.  PT Evaluation Precautions/Restrictions Precautions Precautions: Fall Precaution Comments: L hemiparesis, decreased safety awareness, impulsive Restrictions Weight Bearing Restrictions: No Pain Pain Assessment Pain Assessment: No/denies pain Home Living/Prior Functioning Home Living Available Help at Discharge: Available PRN/intermittently (grandson works from 7a-11p daily per patient) Type of Home: House Home Access: Stairs to enter Technical brewer of Steps: 6 Entrance Stairs-Rails: Right Home Layout: One level  Lives With: Other (Comment)  (grandson) Prior Function Level of Independence: Requires assistive device for independence (uses RW in home)  Able to Take Stairs?: Yes Driving: No Vocation: Retired Art gallery manager: Impaired Inattention/Neglect: Does not attend to left side of body Praxis Praxis: Impaired Praxis Impairment Details: Motor planning  Cognition Overall Cognitive Status: Impaired/Different from baseline Arousal/Alertness: Awake/alert Orientation Level: Oriented X4 Attention: Selective;Alternating Selective Attention: Appears intact Alternating Attention: Impaired Alternating Attention Impairment: Verbal basic;Functional basic Memory: Impaired Awareness: Impaired Awareness Impairment: Emergent impairment Problem Solving: Impaired Behaviors: Impulsive Safety/Judgment: Impaired Sensation Sensation Light Touch: Appears Intact Coordination Gross Motor Movements are Fluid and Coordinated: No Fine Motor Movements are Fluid and Coordinated: No Coordination and Movement Description: LUE and LLE decreased gross/fine motor  Motor  Motor Motor: Hemiplegia;Abnormal postural alignment and control  Mobility Bed Mobility Bed Mobility: Sit to Supine;Supine to Sit Supine to Sit: 4: Min assist;With rails;HOB elevated Supine to Sit Details: Verbal cues for safe use of DME/AE;Verbal cues for technique Sit to Supine: 4: Min guard;With rail;HOB elevated Transfers Transfers: Yes Squat Pivot Transfers: 4: Min Risk manager Details: Verbal cues for sequencing;Visual cues/gestures for sequencing;Verbal cues for safe use of DME/AE;Verbal cues for technique Locomotion  Ambulation Ambulation: Yes Ambulation/Gait Assistance: 2: Max assist Ambulation Distance (Feet): 30 Feet Assistive device:  (rail in hallway) Ambulation/Gait Assistance Details: Manual facilitation for weight bearing;Manual facilitation for weight shifting;Manual facilitation for placement;Verbal cues for  precautions/safety;Verbal cues for technique;Verbal cues for sequencing;Verbal cues for safe use of DME/AE;Verbal cues for gait pattern Ambulation/Gait Assistance Details: assist for weight shift, cues for upright posture and LLE step through Stairs / Additional Locomotion Stairs: Yes Stairs Assistance: 1: +1 Total assist Stair Management Technique: One rail Right Number of Stairs: 8 Height of Stairs: 3 Wheelchair Mobility Wheelchair Mobility: No  Trunk/Postural Assessment  Cervical Assessment Cervical Assessment: Within Functional Limits Thoracic Assessment Thoracic Assessment: Within Functional Limits Lumbar Assessment Lumbar Assessment: Within Functional Limits Postural Control Postural Control: Deficits on evaluation Righting Reactions: delayed Protective Responses: delayed  Balance Balance Balance Assessed: Yes Static Sitting Balance Static Sitting - Balance Support: Right upper extremity supported;Feet unsupported Static Sitting - Level of Assistance: 3: Mod assist Dynamic Sitting Balance Dynamic Sitting - Balance Support: Feet supported;During functional  activity Dynamic Sitting - Level of Assistance: 2: Max Insurance risk surveyor Standing - Balance Support: Right upper extremity supported;Left upper extremity supported Static Standing - Level of Assistance: 3: Mod assist Dynamic Standing Balance Dynamic Standing - Balance Support: Right upper extremity supported;During functional activity Dynamic Standing - Level of Assistance: 1: +1 Total assist Extremity Assessment      RLE Assessment RLE Assessment: Within Functional Limits RLE AROM (degrees) RLE Overall AROM Comments: WFL assessed in sitting RLE Strength Right Hip Flexion: 3+/5 Right Knee Flexion: 5/5 Right Knee Extension: 5/5 Right Ankle Dorsiflexion: 5/5 Right Ankle Plantar Flexion: 5/5 LLE Assessment LLE Assessment: Exceptions to WFL LLE PROM (degrees) LLE Overall PROM Comments: WFL  assessed in sitting LLE Strength Left Hip Flexion: 1/5 Left Knee Flexion: 1/5 Left Knee Extension: 1/5 Left Ankle Dorsiflexion: 0/5 Left Ankle Plantar Flexion: 1/5   See Function Navigator for Current Functional Status.   Refer to Care Plan for Long Term Goals  Recommendations for other services: None   Discharge Criteria: Patient will be discharged from PT if patient refuses treatment 3 consecutive times without medical reason, if treatment goals not met, if there is a change in medical status, if patient makes no progress towards goals or if patient is discharged from hospital.  The above assessment, treatment plan, treatment alternatives and goals were discussed and mutually agreed upon: by patient  Urban Gibson E Penven-Crew 03/10/2017, 12:11 PM

## 2017-03-11 ENCOUNTER — Other Ambulatory Visit (HOSPITAL_COMMUNITY): Payer: Medicare Other

## 2017-03-11 DIAGNOSIS — E119 Type 2 diabetes mellitus without complications: Secondary | ICD-10-CM

## 2017-03-11 NOTE — Progress Notes (Signed)
[place] PHYSICAL MEDICINE & REHABILITATION     PROGRESS NOTE  Subjective/Complaints:  Patient slept well overnight. She had a good first in therapies.  ROS: Denies CP, SOB, N/V/D.  Objective: Vital Signs: Blood pressure 133/66, pulse 76, temperature 97.9 F (36.6 C), temperature source Oral, resp. rate 18, weight 59.6 kg (131 lb 5 oz), SpO2 98 %. No results found.  Recent Labs  03/10/17 0404  WBC 6.4  HGB 11.4*  HCT 34.3*  PLT 162    Recent Labs  03/09/17 0644 03/10/17 0404  NA 140 139  K 3.6 3.3*  CL 108 107  GLUCOSE 98 107*  BUN 6 9  CREATININE 0.70 0.76  CALCIUM 8.4* 8.5*   CBG (last 3)   Recent Labs  03/08/17 2150 03/09/17 0649 03/09/17 1155  GLUCAP 119* 97 141*    Wt Readings from Last 3 Encounters:  03/10/17 59.6 kg (131 lb 5 oz)  03/07/17 64.9 kg (143 lb)  01/18/17 67.6 kg (149 lb 0.5 oz)    Physical Exam:  BP 133/66 (BP Location: Right Arm)   Pulse 76   Temp 97.9 F (36.6 C) (Oral)   Resp 18   Wt 59.6 kg (131 lb 5 oz)   SpO2 98%   BMI 22.54 kg/m  Constitutional: She appears well-developed and well-nourished.  HENT: Normocephalic and atraumatic.  Eyes: EOM are normal. No discharge.   Cardiovascular: RRR.  No JVD. Respiratory: Effort normal and breath sounds normal.  GI: Soft. Bowel sounds are normal.   Musculoskeletal: She exhibits no edema or tenderness.  Neurological: She is alert and oriented.  Extremely HOH.  Minimal dysarthria.  Motor: RUE/RLE: 5/5 proximal to distal LUE: 4+/5 with apraxia LLE: HF 4/5, KE 3-/5, ADF/PF 2-/5 Skin: Skin is warm and dry.  Psychiatric: She has a normal mood and affect. Her behavior is normal. Thought content normal.    Assessment/Plan: 1. Functional deficits secondary to right CVA which require 3+ hours per day of interdisciplinary therapy in a comprehensive inpatient rehab setting. Physiatrist is providing close team supervision and 24 hour management of active medical problems listed  below. Physiatrist and rehab team continue to assess barriers to discharge/monitor patient progress toward functional and medical goals.  Function:  Bathing Bathing position   Position: Sitting EOB  Bathing parts Body parts bathed by patient: Left arm, Chest, Abdomen, Front perineal area, Right upper leg, Left upper leg Body parts bathed by helper: Right arm, Buttocks, Right lower leg, Left lower leg, Back  Bathing assist        Upper Body Dressing/Undressing Upper body dressing   What is the patient wearing?: Bra Bra - Perfomed by patient: Thread/unthread right bra strap, Thread/unthread left bra strap Bra - Perfomed by helper: Hook/unhook bra (pull down sports bra)            Upper body assist Assist Level: Touching or steadying assistance(Pt > 75%)      Lower Body Dressing/Undressing Lower body dressing   What is the patient wearing?: Underwear, Pants, Non-skid slipper socks, Socks, Shoes   Underwear - Performed by helper: Thread/unthread right underwear leg, Thread/unthread left underwear leg, Pull underwear up/down   Pants- Performed by helper: Thread/unthread right pants leg, Thread/unthread left pants leg, Pull pants up/down   Non-skid slipper socks- Performed by helper: Don/doff right sock, Don/doff left sock   Socks - Performed by helper: Don/doff right sock, Don/doff left sock   Shoes - Performed by helper: Don/doff right shoe, Don/doff left shoe  Lower body assist        Toileting Toileting Toileting activity did not occur: No continent bowel/bladder event Toileting steps completed by patient: Performs perineal hygiene Toileting steps completed by helper: Adjust clothing prior to toileting, Adjust clothing after toileting Toileting Assistive Devices: Grab bar or rail  Toileting assist Assist level: More than reasonable time, Two helpers   Transfers Chair/bed transfer   Chair/bed transfer method: Squat pivot Chair/bed transfer assist level:  Touching or steadying assistance (Pt > 75%) Chair/bed transfer assistive device: Armrests     Locomotion Ambulation     Max distance: 30 Assist level: Maximal assist (Pt 25 - 49%)   Wheelchair          Cognition Comprehension Comprehension assist level: Follows basic conversation/direction with no assist  Expression Expression assist level: Expresses basic needs/ideas: With no assist  Social Interaction Social Interaction assist level: Interacts appropriately with others - No medications needed.  Problem Solving Problem solving assist level: Solves basic 90% of the time/requires cueing < 10% of the time  Memory Memory assist level: Recognizes or recalls 90% of the time/requires cueing < 10% of the time    Medical Problem List and Plan: 1.   Left sided weakness, cognitive deficits with no safety awareness and hard to redirect secondary to right CVA on 6/27.  Continue CIR  2.  DVT Prophylaxis/Anticoagulation: Pharmaceutical: Pradexa 3. Pain Management: tylenol prn 4. Mood: LCSW to follow for evaluation and support 5. Neuropsych: This patient is capable of making decisions on her own behalf. 6. Skin/Wound Care: routine pressure relief measures.  7. Fluids/Electrolytes/Nutrition: Monitor I/O.  8. Anemia:   Hb 11.4 on 6/30  Cont to monitor 9. DM diet controlled: Hgb A1c- 6.0.  BS stable overall will check BS bid with SSI as needed. Poor intake--d/ced cardiac restrictions and continue CM diet.  10. HTN: monitor BP bid and avoid hypotension.  Continue atenolol daily.   Improving 11. Hypokalemia  K+ 3.3 on 6/30  Will supplement x1 day 12. Hypoalbuminemia  Supplement initiated 6/30  LOS (Days) 2 A FACE TO FACE EVALUATION WAS PERFORMED  Ersa Delaney Lorie Phenix 03/11/2017 9:06 AM

## 2017-03-12 ENCOUNTER — Inpatient Hospital Stay (HOSPITAL_COMMUNITY): Payer: Medicare Other

## 2017-03-12 ENCOUNTER — Inpatient Hospital Stay (HOSPITAL_COMMUNITY): Payer: Medicare Other | Admitting: Physical Therapy

## 2017-03-12 ENCOUNTER — Other Ambulatory Visit (HOSPITAL_COMMUNITY): Payer: Medicare Other

## 2017-03-12 DIAGNOSIS — G8194 Hemiplegia, unspecified affecting left nondominant side: Secondary | ICD-10-CM

## 2017-03-12 LAB — GLUCOSE, CAPILLARY
GLUCOSE-CAPILLARY: 114 mg/dL — AB (ref 65–99)
GLUCOSE-CAPILLARY: 129 mg/dL — AB (ref 65–99)
GLUCOSE-CAPILLARY: 91 mg/dL (ref 65–99)
GLUCOSE-CAPILLARY: 95 mg/dL (ref 65–99)
Glucose-Capillary: 100 mg/dL — ABNORMAL HIGH (ref 65–99)
Glucose-Capillary: 115 mg/dL — ABNORMAL HIGH (ref 65–99)
Glucose-Capillary: 125 mg/dL — ABNORMAL HIGH (ref 65–99)
Glucose-Capillary: 140 mg/dL — ABNORMAL HIGH (ref 65–99)
Glucose-Capillary: 148 mg/dL — ABNORMAL HIGH (ref 65–99)
Glucose-Capillary: 71 mg/dL (ref 65–99)
Glucose-Capillary: 93 mg/dL (ref 65–99)
Glucose-Capillary: 120 mg/dL — ABNORMAL HIGH (ref 65–99)
Glucose-Capillary: 239 mg/dL — ABNORMAL HIGH (ref 65–99)

## 2017-03-12 NOTE — Progress Notes (Signed)
Patient information reviewed and entered into eRehab system by Shayanna Thatch, RN, CRRN, PPS Coordinator.  Information including medical coding and functional independence measure will be reviewed and updated through discharge.     Per nursing patient was given "Data Collection Information Summary for Patients in Inpatient Rehabilitation Facilities with attached "Privacy Act Statement-Health Care Records" upon admission.  

## 2017-03-12 NOTE — Progress Notes (Signed)
Physical Therapy Session Note  Patient Details  Name: Christy Sutton MRN: [id number] Date of Birth: 1925/12/02  Today's Date: 03/12/2017 PT Individual Time: 0900-0930 PT Individual Time Calculation (min): 30 min   Short Term Goals: Week 1:  PT Short Term Goal 1 (Week 1): pt will demonstrate dynamic sitting during a functional task with min assist PT Short Term Goal 2 (Week 1): pt will ambulate 45' with LRAD PT Short Term Goal 3 (Week 1): pt will transfer with consistent min assist  PT Short Term Goal 4 (Week 1): pt will demonstrate emergent awareness with mod cues   Skilled Therapeutic Interventions/Progress Updates: Pt presented in w/c requesting to return to bed. Agreeable to return after therapy. Transported to rehab gym for time management. Performed squat pivot transfer to R with modA and max cues as pt demonstrated increased impulsiveness. Performed sit to/from stand from mat (x5) with RW with max cues for sequencing and safety with activity. Pt required max cues for attention to task and decreased lean to L upon standing. Pt performed standing task with forced use of LUE stacking cups. Pt would occasionally try to use RUE taking hands off RW, requiring max cues for safety. Pt returned to room and performed squat pivot to L to return to bed, requiring max cues for hand placement and safety. Pt performed sit to supine with minA and able to scoot to [facility] with cues. Pt left with call bell within reach and bed alarm set.      Therapy Documentation Precautions:  Precautions Precautions: Fall Precaution Comments: L hemiparesis, impulsive, HOH Restrictions Weight Bearing Restrictions: No General:   Vital Signs:  Pain:   Mobility:   Locomotion :    Trunk/Postural Assessment :    Balance:   Exercises:   Other Treatments:     See Function Navigator for Current Functional Status.   Therapy/Group: Individual Therapy  Ellinor Test 03/12/2017, 11:15 AM

## 2017-03-12 NOTE — Progress Notes (Signed)
Occupational Therapy Session Note  Patient Details  Name: Christy Sutton MRN: [id number] Date of Birth: 20-Apr-1926  Today's Date: 03/12/2017 OT Individual Time: 1330-1400 OT Individual Time Calculation (min): 30 min  and Today's Date: 03/12/2017 OT Missed Time: 30 Minutes Missed Time Reason: Nursing care   Short Term Goals: Week 1:  OT Short Term Goal 1 (Week 1): Pt will complete 2/3 components of LB dressing in supported seated position  OT Short Term Goal 2 (Week 1): Pt will complete toilet or BSC transfer with Mod A OT Short Term Goal 3 (Week 1): Pt will complete shower transfer with Max A and LRAD OT Short Term Goal 4 (Week 1): Pt will don shirt with supervision   Skilled Therapeutic Interventions/Progress Updates:    Initially attempted to see pt at 1300 but pt requested to use toilet and declined assistance from female staff member.  Notation made with scheduler for "No Males" for BADLs. Nursing staff assisted pt with toileting needs and transfers.  Attempted to engaged pt in sitting and standing activities but pt was resistant and demonstrated difficulty with following directions/commands 2/2 pt extremely hard of hearing.  Pt assisted back into bed with min A and repositioned.  Pt remained in bed with all needs within reach and bed alarm activated.   Therapy Documentation Precautions:  Precautions Precautions: Fall Precaution Comments: L hemiparesis, impulsive, HOH Restrictions Weight Bearing Restrictions: No General: General OT Amount of Missed Time: 30 Minutes  Pain:  Pt denies pain  See Function Navigator for Current Functional Status.   Therapy/Group: Individual Therapy  Leroy Libman 03/12/2017, 2:54 PM

## 2017-03-12 NOTE — Progress Notes (Signed)
[place] PHYSICAL MEDICINE & REHABILITATION     PROGRESS NOTE  Subjective/Complaints:  Pt seen laying in bed this AM.  She slept well overnight.    ROS: Denies CP, SOB, N/V/D.  Objective: Vital Signs: Blood pressure (!) 108/55, pulse 64, temperature 97.9 F (36.6 C), temperature source Oral, resp. rate 18, weight 62.3 kg (137 lb 6 oz), SpO2 99 %. No results found.  Recent Labs  03/10/17 0404  WBC 6.4  HGB 11.4*  HCT 34.3*  PLT 162    Recent Labs  03/10/17 0404  NA 139  K 3.3*  CL 107  GLUCOSE 107*  BUN 9  CREATININE 0.76  CALCIUM 8.5*   CBG (last 3)   Recent Labs  03/11/17 1639 03/11/17 2354 03/12/17 0555  GLUCAP 129* 140* 93    Wt Readings from Last 3 Encounters:  03/11/17 62.3 kg (137 lb 6 oz)  03/07/17 64.9 kg (143 lb)  01/18/17 67.6 kg (149 lb 0.5 oz)    Physical Exam:  BP (!) 108/55 (BP Location: Left Arm)   Pulse 64   Temp 97.9 F (36.6 C) (Oral)   Resp 18   Wt 62.3 kg (137 lb 6 oz)   SpO2 99%   BMI 23.58 kg/m  Constitutional: She appears well-developed and well-nourished.  HENT: Normocephalic and atraumatic.  Eyes: EOM are normal. No discharge.   Cardiovascular: RRR.  No JVD. Respiratory: Effort normal and breath sounds normal.  GI: Soft. Bowel sounds are normal.   Musculoskeletal: She exhibits no edema or tenderness.  Neurological: She is alert and oriented.  Extremely HOH.  Minimal dysarthria.  Motor: RUE/RLE: 5/5 proximal to distal LUE: 4+/5 with apraxia LLE: HF 4/5, KE 3-/5, ADF/PF 2-/5 (stable) Skin: Skin is warm and dry.  Psychiatric: She has a normal mood and affect. Her behavior is normal. Thought content normal.    Assessment/Plan: 1. Functional deficits secondary to right CVA which require 3+ hours per day of interdisciplinary therapy in a comprehensive inpatient rehab setting. Physiatrist is providing close team supervision and 24 hour management of active medical problems listed below. Physiatrist and rehab team  continue to assess barriers to discharge/monitor patient progress toward functional and medical goals.  Function:  Bathing Bathing position   Position: Sitting EOB  Bathing parts Body parts bathed by patient: Left arm, Chest, Abdomen, Front perineal area, Right upper leg, Left upper leg Body parts bathed by helper: Right arm, Buttocks, Right lower leg, Left lower leg, Back  Bathing assist        Upper Body Dressing/Undressing Upper body dressing   What is the patient wearing?: Bra Bra - Perfomed by patient: Thread/unthread right bra strap, Thread/unthread left bra strap Bra - Perfomed by helper: Hook/unhook bra (pull down sports bra)            Upper body assist Assist Level: Touching or steadying assistance(Pt > 75%)      Lower Body Dressing/Undressing Lower body dressing   What is the patient wearing?: Underwear, Pants, Non-skid slipper socks, Socks, Shoes   Underwear - Performed by helper: Thread/unthread right underwear leg, Thread/unthread left underwear leg, Pull underwear up/down   Pants- Performed by helper: Thread/unthread right pants leg, Thread/unthread left pants leg, Pull pants up/down   Non-skid slipper socks- Performed by helper: Don/doff right sock, Don/doff left sock   Socks - Performed by helper: Don/doff right sock, Don/doff left sock   Shoes - Performed by helper: Don/doff right shoe, Don/doff left shoe  Lower body assist        Toileting Toileting Toileting activity did not occur: No continent bowel/bladder event Toileting steps completed by patient: Performs perineal hygiene Toileting steps completed by helper: Adjust clothing prior to toileting, Adjust clothing after toileting Toileting Assistive Devices: Grab bar or rail  Toileting assist Assist level:  (maxA)   Transfers Chair/bed transfer   Chair/bed transfer method: Squat pivot Chair/bed transfer assist level: Touching or steadying assistance (Pt > 75%) Chair/bed transfer  assistive device: Armrests     Locomotion Ambulation     Max distance: 30 Assist level: Maximal assist (Pt 25 - 49%)   Wheelchair          Cognition Comprehension Comprehension assist level: Follows basic conversation/direction with no assist  Expression Expression assist level: Expresses basic needs/ideas: With no assist  Social Interaction Social Interaction assist level: Interacts appropriately with others - No medications needed.  Problem Solving Problem solving assist level: Solves basic 90% of the time/requires cueing < 10% of the time  Memory Memory assist level: Recognizes or recalls 90% of the time/requires cueing < 10% of the time    Medical Problem List and Plan: 1.   Left sided weakness, cognitive deficits with no safety awareness and hard to redirect secondary to right CVA on 6/27.  Continue CIR  2.  DVT Prophylaxis/Anticoagulation: Pharmaceutical: Pradexa 3. Pain Management: tylenol prn 4. Mood: LCSW to follow for evaluation and support 5. Neuropsych: This patient is capable of making decisions on her own behalf. 6. Skin/Wound Care: routine pressure relief measures.  7. Fluids/Electrolytes/Nutrition: Monitor I/O.  8. Anemia:   Hb 11.4 on 6/30  Cont to monitor 9. DM diet controlled: Hgb A1c- 6.0.  BS stable overall will check BS bid with SSI as needed. Poor intake--d/ced cardiac restrictions and continue CM diet.   Controlled 7/2 10. HTN: monitor BP bid and avoid hypotension.  Continue atenolol daily.   Controlled 7/2 11. Hypokalemia  K+ 3.3 on 6/30  Supplemented x1 day  Labs ordered for tomorrow 12. Hypoalbuminemia  Supplement initiated 6/30  LOS (Days) 3 A FACE TO FACE EVALUATION WAS PERFORMED  Shaylin Blatt Lorie Phenix 03/12/2017 10:15 AM

## 2017-03-12 NOTE — Progress Notes (Signed)
Physical Therapy Session Note  Patient Details  Name: Christy Sutton MRN: [id number] Date of Birth: [birth year]  Today's Date: 03/12/2017 PT Individual Time: 1430-1515 and 0800-0900 PT Individual Time Calculation (min): 45 min and 60 min (total 105 min)   Short Term Goals: Week 1:  PT Short Term Goal 1 (Week 1): pt will demonstrate dynamic sitting during a functional task with min assist PT Short Term Goal 2 (Week 1): pt will ambulate 55' with LRAD PT Short Term Goal 3 (Week 1): pt will transfer with consistent min assist  PT Short Term Goal 4 (Week 1): pt will demonstrate emergent awareness with mod cues   Skilled Therapeutic Interventions/Progress Updates: Tx 1: pt received seated in bed, denies pain and agreeable to treatment. Supine>sit with min guard and increased time, HOB elevated and bedrails with two posterior LOBs recovered without assist using RUE on bedrail. Sitting balance on EOB with S. Squat pivot transfer to R side min/modA; cues for straightening position in chair and scooting back off edge of seat. Squat pivot transfer w/c <>BSC over toilet with modA. Requires maxA for clothing management and standing balance. Pt very impulsive while sitting on toilet attempting to perform peri hygiene; overt losses of balance to L side with poor righting reactions and poor awareness of LOBs. Seated at sink pt performed grooming and hand hygiene with setupA and increased time. Sit <>stand in hall at [place] with [place]. Visual mirror feedback for postural awareness and L lateral lean while pt performing RLE forward/backward stepping, and RLE toe taps to 3" step to facilitate L weight bearing and stance control. Requires mod/maxA for LLE stance control, improved with repetition, however limited by pt impulsivity and poor attention to task. Remained seated in w/c at end of session, quick release belt intact and all needs in reach.   Tx 2: pt received seated in bed with handoff from [place]; denies pain and  agreeable to treatment. Supine>sit with S, HOB elevated and bedrails with increased time required and pt managing LLE off bed with BUEs. ModA squat pivot transfer to w/c. W/c propulsion BUE x150' with min guard initially decreased to S with repetition; performed for focus on LUE NMR and attention to L side. Squat pivot transfer w/c <>nustep with minA. Performed nustep x10 min total BUE/BLE on level 2 with average 25 steps/min for L NMR, strengthening and aerobic endurance. Gait x5' with LUE over therapist shoulder, maxA d/t LLE instability. Sitting balance on edge of mat maxA with several posterior LOB while attempting to use BUE to don pillow cases; pt demonstrates righting reactions however delayed and inadequate to prevent fall. Balance eventually improved to standbyA. Performed LUE pipe tree for forced use, NMR. Requires occasional use of therapist or RUE to stabilize, and increased time when using LUE. Returned to room totalA; remained seated in w/c with quick release belt intact and all needs in reach.      Therapy Documentation Precautions:  Precautions Precautions: Fall Precaution Comments: L hemiparesis, impulsive, HOH Restrictions Weight Bearing Restrictions: No  See Function Navigator for Current Functional Status.   Therapy/Group: Individual Therapy  [name] 03/12/2017, 11:01 AM

## 2017-03-13 ENCOUNTER — Inpatient Hospital Stay (HOSPITAL_COMMUNITY): Payer: Medicare Other | Admitting: Occupational Therapy

## 2017-03-13 ENCOUNTER — Inpatient Hospital Stay (HOSPITAL_COMMUNITY): Payer: Medicare Other

## 2017-03-13 ENCOUNTER — Inpatient Hospital Stay (HOSPITAL_COMMUNITY): Payer: Medicare Other | Admitting: Physical Therapy

## 2017-03-13 DIAGNOSIS — I63119 Cerebral infarction due to embolism of unspecified vertebral artery: Secondary | ICD-10-CM

## 2017-03-13 LAB — BASIC METABOLIC PANEL
Anion gap: 8 (ref 5–15)
BUN: 14 mg/dL (ref 6–20)
CHLORIDE: 108 mmol/L (ref 101–111)
CO2: 24 mmol/L (ref 22–32)
Calcium: 8.7 mg/dL — ABNORMAL LOW (ref 8.9–10.3)
Creatinine, Ser: 0.58 mg/dL (ref 0.44–1.00)
GFR calc Af Amer: >60 mL/min (ref >60)
GFR calc non Af Amer: >60 mL/min (ref >60)
GLUCOSE: 107 mg/dL — AB (ref 65–99)
POTASSIUM: 3.7 mmol/L (ref 3.5–5.1)
Sodium: 140 mmol/L (ref 135–145)

## 2017-03-13 LAB — GLUCOSE, CAPILLARY
Glucose-Capillary: 107 mg/dL — ABNORMAL HIGH (ref 65–99)
Glucose-Capillary: 115 mg/dL — ABNORMAL HIGH (ref 65–99)
Glucose-Capillary: 95 mg/dL (ref 65–99)

## 2017-03-13 NOTE — Progress Notes (Signed)
Inpatient [facility] Individual Statement of Services  Patient Name:  Christy Sutton  Date:  03/13/2017  Welcome to the [facility].  Our goal is to provide you with an individualized program based on your diagnosis and situation, designed to meet your specific needs.  With this comprehensive rehabilitation program, you will be expected to participate in at least 3 hours of rehabilitation therapies Monday-Friday, with modified therapy programming on the weekends.  Your rehabilitation program will include the following services:  Physical Therapy (PT), Occupational Therapy (OT), 24 hour per day rehabilitation nursing, Case Management (Social Worker), Rehabilitation Medicine, Nutrition Services and Pharmacy Services  Weekly team conferences will be held on Tuesdays to discuss your progress.  Your Social Worker will talk with you frequently to get your input and to update you on team discussions.  Team conferences with you and your family in attendance may also be held.  Expected length of stay:  21 to 24 days   Overall anticipated outcome:  Minimal Assistance   Depending on your progress and recovery, your program may change. Your Social Worker will coordinate services and will keep you informed of any changes. Your Social Worker's name and contact numbers are listed  below.  The following services may also be recommended but are not provided by the [facility] will be made to provide these services after discharge if needed.  Arrangements include referral to agencies that provide these services.  Your insurance has been verified to be:  Medicare Your primary doctor is:  Dr. Leanna Battles  Pertinent information will be shared with your doctor and your insurance company.  Social Worker:  Alfonse Alpers, LCSW  [phone] or [phone]  Information discussed with and copy given to patient by: Trey Sailors, 03/13/2017, 10:44 AM

## 2017-03-13 NOTE — Progress Notes (Signed)
Physical Therapy Session Note  Patient Details  Name: Christy Sutton MRN: [id number] Date of Birth: 04/14/1926  Today's Date: 03/13/2017 PT Individual Time: 1100-1200 PT Individual Time Calculation (min): 60 min   Short Term Goals: Week 1:  PT Short Term Goal 1 (Week 1): pt will demonstrate dynamic sitting during a functional task with min assist PT Short Term Goal 2 (Week 1): pt will ambulate 52' with LRAD PT Short Term Goal 3 (Week 1): pt will transfer with consistent min assist  PT Short Term Goal 4 (Week 1): pt will demonstrate emergent awareness with mod cues   Skilled Therapeutic Interventions/Progress Updates: Pt received seated in w/c, denies pain and agreeable to treatment. Transported to gym totalA for energy conservation. Standing balance at [employer] rail with mirror visual feedback performing RUE forward/backward stepping for LLE weight bearing and stance control. Significantly improved LLE activation and coordination compared to yesterday's trial. Gait 2x25' with R hall rail and modA for reducing LLE scissoring and facilitating L hip extension in stance. Repetitive cues to slow down and attend to LLE placement. Stand pivot x8 throughout session with variable min/modA with cues for hand placement and attention to LLE during turn. Supine <>prone with S and increased time. Prone on elbows x3 min for BUE weight bearing. Weight shifting R/L and anterior/posterior with min guard at LUE for stability. W/c propulsion x150' with BUE and close S, improving LUE coordination during task. Remained seated in w/c at end of session, quick release belt intact and all needs in reach.      Therapy Documentation Precautions:  Precautions Precautions: Fall Precaution Comments: L hemiparesis, impulsive, HOH Restrictions Weight Bearing Restrictions: No Pain: Pain Assessment Pain Assessment: No/denies pain   See Function Navigator for Current Functional Status.   Therapy/Group: Individual  Therapy  [name] 03/13/2017, 11:46 AM

## 2017-03-13 NOTE — Progress Notes (Signed)
[facility] PHYSICAL MEDICINE & REHABILITATION     PROGRESS NOTE  Subjective/Complaints:  Pt up in bathroom when I first arrived. No major problems today reported.   ROS: pt denies nausea, vomiting, diarrhea, cough, shortness of breath or chest pain   Objective: Vital Signs: Blood pressure 128/65, pulse 68, temperature 97.5 F (36.4 C), temperature source Oral, resp. rate 17, weight 62.3 kg (137 lb 6 oz), SpO2 99 %. No results found. No results for input(s): WBC, HGB, HCT, PLT in the last 72 hours.  Recent Labs  03/13/17 0435  NA 140  K 3.7  CL 108  GLUCOSE 107*  BUN 14  CREATININE 0.58  CALCIUM 8.7*   CBG (last 3)   Recent Labs  03/12/17 1152 03/12/17 1708 03/13/17 1200  GLUCAP 120* 239* 115*    Wt Readings from Last 3 Encounters:  03/11/17 62.3 kg (137 lb 6 oz)  03/07/17 64.9 kg (143 lb)  01/18/17 67.6 kg (149 lb 0.5 oz)    Physical Exam:  BP 128/65 (BP Location: Left Arm)   Pulse 68   Temp 97.5 F (36.4 C) (Oral)   Resp 17   Wt 62.3 kg (137 lb 6 oz)   SpO2 99%   BMI 23.58 kg/m  Constitutional: She appears well-developed and well-nourished.  HENT: Normocephalic and atraumatic.  Eyes: EOM are normal. No discharge.   Cardiovascular: RRR without murmur. No JVD  Respiratory:CTA Bilaterally without wheezes or rales. Normal effort   GI: Soft. Bowel sounds are normal.   Musculoskeletal: She exhibits no edema or tenderness.  Neurological: She is alert and oriented.  Extremely HOH.  Minimal dysarthria.  Motor: RUE/RLE: 5/5 proximal to distal LUE: 4+/5 with apraxia LLE: HF 4/5, KE 3-/5, ADF/PF 2-/5---stable on exam today Skin: Skin is warm and dry.  Psychiatric: She has a normal mood and affect. Her behavior is normal. Thought content normal.    Assessment/Plan: 1. Functional deficits secondary to right CVA which require 3+ hours per day of interdisciplinary therapy in a comprehensive inpatient rehab setting. Physiatrist is providing close team supervision  and 24 hour management of active medical problems listed below. Physiatrist and rehab team continue to assess barriers to discharge/monitor patient progress toward functional and medical goals.  Function:  Bathing Bathing position   Position: Wheelchair/chair at sink  Bathing parts Body parts bathed by patient: Left arm, Chest, Abdomen, Front perineal area, Right arm, Buttocks (pt declined bathing legs) Body parts bathed by helper: Back  Bathing assist        Upper Body Dressing/Undressing Upper body dressing   What is the patient wearing?: Bra, Pull over shirt/dress Bra - Perfomed by patient: Thread/unthread right bra strap, Thread/unthread left bra strap Bra - Perfomed by helper: Hook/unhook bra (pull down sports bra) Pull over shirt/dress - Perfomed by patient: Thread/unthread right sleeve, Thread/unthread left sleeve, Put head through opening Pull over shirt/dress - Perfomed by helper: Pull shirt over trunk        Upper body assist Assist Level: Touching or steadying assistance(Pt > 75%)      Lower Body Dressing/Undressing Lower body dressing   What is the patient wearing?: Underwear, Pants, Non-skid slipper socks Underwear - Performed by patient: Thread/unthread right underwear leg Underwear - Performed by helper: Thread/unthread left underwear leg, Pull underwear up/down Pants- Performed by patient: Thread/unthread right pants leg Pants- Performed by helper: Thread/unthread left pants leg, Pull pants up/down   Non-skid slipper socks- Performed by helper: Don/doff right sock, Don/doff left sock  Socks - Performed by helper: Don/doff right sock, Don/doff left sock   Shoes - Performed by helper: Don/doff right shoe, Don/doff left shoe          Lower body assist Assist for lower body dressing: Touching or steadying assistance (Pt > 75%) (max A with balance in stand due to left lean)      Toileting Toileting Toileting activity did not occur: No continent bowel/bladder  event Toileting steps completed by patient: Performs perineal hygiene Toileting steps completed by helper: Adjust clothing prior to toileting, Adjust clothing after toileting Toileting Assistive Devices: Grab bar or rail  Toileting assist Assist level: Touching or steadying assistance (Pt.75%)   Transfers Chair/bed transfer   Chair/bed transfer method: Stand pivot Chair/bed transfer assist level: Moderate assist (Pt 50 - 74%/lift or lower) Chair/bed transfer assistive device: Armrests     Locomotion Ambulation     Max distance: 25 Assist level: Moderate assist (Pt 50 - 74%)   Wheelchair   Type: Manual Max wheelchair distance: 150 Assist Level: Touching or steadying assistance (Pt > 75%)  Cognition Comprehension Comprehension assist level: Understands basic 75 - 89% of the time/ requires cueing 10 - 24% of the time  Expression Expression assist level: Expresses basic needs/ideas: With no assist  Social Interaction Social Interaction assist level: Interacts appropriately with others with medication or extra time (anti-anxiety, antidepressant).  Problem Solving Problem solving assist level: Solves basic 75 - 89% of the time/requires cueing 10 - 24% of the time  Memory Memory assist level: Recognizes or recalls 75 - 89% of the time/requires cueing 10 - 24% of the time    Medical Problem List and Plan: 1.   Left sided weakness, cognitive deficits with no safety awareness and hard to redirect secondary to right CVA on 6/27.  Continue CIR --team conference today 2.  DVT Prophylaxis/Anticoagulation: Pharmaceutical: Pradexa 3. Pain Management: tylenol prn 4. Mood: LCSW to follow for evaluation and support 5. Neuropsych: This patient is capable of making decisions on her own behalf. 6. Skin/Wound Care: routine pressure relief measures.  7. Fluids/Electrolytes/Nutrition: Monitor I/O.  8. Anemia:   Hb 11.4 on 6/30  Cont to monitor 9. DM diet controlled: Hgb A1c- 6.0.  BS stable overall  will check BS bid with SSI as needed. Poor intake--d/ced cardiac restrictions and continue CM diet.   Fair control as of present 10. HTN: monitor BP bid and avoid hypotension.  Continue atenolol daily.    -reasonable control at present 11. Hypokalemia  K+ 3.3 on 6/30---3.7 7/3---continue current dosing    12. Hypoalbuminemia  Supplement initiated 6/30  LOS (Days) 4 A FACE TO FACE EVALUATION WAS PERFORMED  Christy Sutton,Christy Sutton 03/13/2017 12:55 PM

## 2017-03-13 NOTE — Progress Notes (Addendum)
Occupational Therapy Session Note  Patient Details  Name: Christy Sutton MRN: [id number] Date of Birth: 1926-03-29  Today's Date: 03/13/2017 OT Individual [id number] and [id number] OT Individual Time Calculation (min): 29 min and 51 min    Short Term Goals: Week 1:  OT Short Term Goal 1 (Week 1): Pt will complete 2/3 components of LB dressing in supported seated position  OT Short Term Goal 2 (Week 1): Pt will complete toilet or BSC transfer with Mod A OT Short Term Goal 3 (Week 1): Pt will complete shower transfer with Max A and LRAD OT Short Term Goal 4 (Week 1): Pt will don shirt with supervision   Skilled Therapeutic Interventions/Progress Updates:    Session 1 with focus on dynamic sitting balance and L UE NMR. Pt total A to rehab gym via w/c, completed stand pivot transfer with ModA to mat table with some impulsivity noted once reaching sitting on mat, mod verbal cues and minA for adjusting sitting position. Pt with minA for maintaining dynamic sitting balance during seated reaching activity, using L UE to reach and place letters and cards during matching and sequencing activities. Pt completed stand pivot transfer edge of mat to w/c and returned to room in manner as described above. Pt left seated in w/c, QRB donned, call bell and needs within reach.   Session 2 with focus on dynamic standing balance, L UE NMR, activity tolerance, and functional mobility transfers. Pt transported room to rehab gym with total A in w/c. Completed dynamic standing activity, Pt stood outside parallel bars using bar for R UE support while using L UE to sequence numbers/letters, crossing midline throughout activity completion. Pt requires MinA for sit<>stand, Min-ModA for maintaining dynamic standing balance with L LE blocked as knee tends to buckle. Pt able to complete sequencing activity given increased cognitive challenge (alternating letters and numbers). Pt with 2 seated rest breaks during standing activity.  Pt completed seated table top thera-putty activity with focus on L UE NMR. Transported Pt back to room total A with Pt completing stand pivot transfer w/c<>BSC over toilet with ModA, minA to steady while Pt completes clothing management with increased assist for clothing management after toileting. Completed seated grooming ADLs with setup at sink seated in w/c for washing hands and brushing hair. Pt completed stand pivot w/c to EOB with ModA and Max verbal cues for safety prior to return to supine. Pt able to scoot to [facility] with MinA and using handrails. Pt left supine in bed, bed alarm activated, call bell and needs within reach.   Therapy Documentation Precautions:  Precautions Precautions: Fall Precaution Comments: L hemiparesis, impulsive, HOH Restrictions Weight Bearing Restrictions: No    Pain: Pain Assessment Pain Assessment: No/denies pain ADL: ADL ADL Comments: Please see functional navigator for ADL status  See Function Navigator for Current Functional Status.   Therapy/Group: Individual Therapy  Raymondo Band 03/13/2017, 3:30 PM

## 2017-03-13 NOTE — Progress Notes (Signed)
Social Work Assessment and Plan  Patient Details  Name: Christy Sutton MRN: [id number] Date of Birth: [birth year]  Today's Date: 03/12/2017  Problem List:  Patient Active Problem List   Diagnosis Date Noted  . Left hemiparesis ([place])   . Acute blood loss anemia   . Benign essential HTN   . Hypokalemia   . Hypoalbuminemia due to protein-calorie malnutrition ([place])   . Stroke due to embolism ([place]) 03/09/2017  . Diabetes mellitus type 2 in nonobese (HCC)   . History of CVA (cerebrovascular accident)   . Cerebral embolism with cerebral infarction 03/07/2017  . Stroke ([place]) 01/17/2017  . TIA (transient ischemic attack) 01/17/2017  . Dysmetria 01/17/2017  . Essential hypertension 01/17/2017  . Diabetes mellitus with complication ([place]) 81/09/7508  . Permanent atrial fibrillation ([place]) 01/17/2017  . Ischemic stroke [facility])    Past Medical History:  Past Medical History:  Diagnosis Date  . Atrial fibrillation ([place])   . Diabetes mellitus without complication ([place])   . Hyperlipidemia   . Hypertension   . TIA (transient ischemic attack)    Past Surgical History:  Past Surgical History:  Procedure Laterality Date  . ABDOMINAL HYSTERECTOMY    . TEE WITHOUT CARDIOVERSION N/A 01/30/2017   Procedure: TRANSESOPHAGEAL ECHOCARDIOGRAM (TEE);  Surgeon: Adrian Prows, MD;  Location: [place];  Service: Cardiovascular;  Laterality: N/A;  . TUBAL LIGATION     Social History:  reports that she has never smoked. She has never used smokeless tobacco. She reports that she does not drink alcohol or use drugs.  Family / Support Systems Marital Status: Widow/Widower How Long?: 15 years Patient Roles: Parent, Other (Comment) (sister) Children: Christy Sutton - dtr-in-law - [phone]; Christy Sutton - brother-in-law - [phone] Other Supports: another son; grandchildren Anticipated Caregiver: Christy Sutton Ability/Limitations of Caregiver: none Caregiver Availability: 24/7 Family Dynamics: close, supportive  family  Social History Preferred language: English Religion: Baptist Education: 9th grade Read: Yes Write: Yes Employment Status: Retired Public relations account executive Issues: none reported Guardian/Conservator: N/A - MD has determined that pt is capable of making her own decisions.   Abuse/Neglect Physical Abuse: Denies Verbal Abuse: Denies Sexual Abuse: Denies Exploitation of patient/patient's resources: Denies Self-Neglect: Denies  Emotional Status Pt's affect, behavior and adjustment status: Pt is very motivated and positive about her rehabilitation.  She reports feeling well emotionally and attributes this to her faith and her family. Recent Psychosocial Issues: none reported Psychiatric History: none reported Substance Abuse History: none reported  Patient / Family Perceptions, Expectations & Goals Pt/Family understanding of illness & functional limitations: Pt/dtr-in-law report a good understanding of pt's condition and limitations and about the stroke. Premorbid pt/family roles/activities: Pt enjoyed mowing and taking care of her yard, being outside, going on errands with dtr-in-law, cooking, going to church, etc. Anticipated changes in roles/activities/participation: Pt would like to resume the above activities as she is able. Pt/family expectations/goals: Pt wants to get back to working in her yard and going out and about with her dtr-in-law.  She wants to be independent again.  Community [employer] Agencies: None Premorbid Home Care/DME Agencies: Other (Comment) ([facility] PTA for RN/PT/OT; also has rolling walker and shower seat) Transportation available at discharge: family Resource referrals recommended: Support group (specify) [facility] Stroke Support Group)  Discharge Planning Living Arrangements: Other relatives (66 y/o grandson lives with pt and pt's dtr-in-law will make sure she or some other family member will be with pt.) Support  Systems: Children, Other relatives, Friends/neighbors, Social worker community, Home  care staff Type of Residence: Private residence Insurance Resources: [employer] Financial Resources: [place] Referred: No Living Expenses: Own Money Management: Patient, Family Does the patient have any problems obtaining your medications?: No Home Management: Pt was doing inside and outside upkeep of her home.  Grandson and sons will help if she's asks them to. Patient/Family Preliminary Plans: Pt's family plans to be with her 24/7 at d/c.  Dtr-in-law to arrange this. Barriers to Discharge: Steps Social Work Anticipated Follow Up Needs: HH/OP, Support Group (Stroke Support Group) Expected length of stay: 21 to 24 days  Clinical Impression CSW met with pt to introduce self and role of CSW, as well as to complete assessment.  Pt is very HOH, but could hear CSW when CSW spoke loud enough.  She is very talkative and positive about her rehabilitation and is motivated to regain her independence.  Pt has good family support and when CSW called pt's dtr-in-law Christy Sutton) she assured CSW that someone would be with pt all the time and that they wanted her to be at home.  CSW explained that CSW would help with that transition to home to assure that pt continues her progress with therapies and with any needed DME.  CSW will also continue to follow pt to check in on mood and coping, although pt is doing well with this right now.  Christy Sutton reports a close relationship with pt and a commitment to keep pt at home.  CSW will continue to follow pt and assist as needed.  Christy Sutton, [name] 03/12/2017, 11:38 AM

## 2017-03-13 NOTE — Progress Notes (Signed)
Occupational Therapy Session Note  Patient Details  Name: Christy Sutton MRN: [id number] Date of Birth: May 19, 1926  Today's Date: 03/13/2017 OT Individual Time: 0800-0900 OT Individual Time Calculation (min): 60 min    Short Term Goals: Week 1:  OT Short Term Goal 1 (Week 1): Pt will complete 2/3 components of LB dressing in supported seated position  OT Short Term Goal 2 (Week 1): Pt will complete toilet or BSC transfer with Mod A OT Short Term Goal 3 (Week 1): Pt will complete shower transfer with Max A and LRAD OT Short Term Goal 4 (Week 1): Pt will don shirt with supervision       Skilled Therapeutic Interventions/Progress Updates:    Pt seen for ADL retraining with a focus on safety awareness and postural control. Pt very anxious to get out of bed to use the bathroom. Pt worked on bed to W/c, w/c >< toilet, and sit to stands at toilet and sink for toileting, bathing, and dressing. Pt moves very quickly and needs strong and loud (HOH) cues to slow down and focus on her posture.  She is ambitious and tries to do things for herself but is not aware when she is leaning to the left.  For example, she cleansed self post toileting but needed therapist to provide max A when sitting and also when standing to pull her pants up with A.  Educated pt on the need to concentrate on balance and she agreed.  Pt actively used LUE and was able to wash her R arm with it.  Pt resting in w/c with quick release belt on and all needs met.  Therapy Documentation Precautions:  Precautions Precautions: Fall Precaution Comments: L hemiparesis, impulsive, HOH Restrictions Weight Bearing Restrictions: No    Vital Signs: Therapy Vitals Temp: 97.5 F (36.4 C) Temp Source: Oral Pulse Rate: 68 Resp: 17 BP: 128/65 Patient Position (if appropriate): Lying Oxygen Therapy SpO2: 99 % O2 Device: Not Delivered Pain: Pain Assessment Pain Assessment: No/denies pain ADL: ADL ADL Comments: Please see functional  navigator for ADL status  See Function Navigator for Current Functional Status.   Therapy/Group: Individual Therapy  [place] 03/13/2017, 8:50 AM

## 2017-03-14 ENCOUNTER — Inpatient Hospital Stay (HOSPITAL_COMMUNITY): Payer: Medicare Other | Admitting: Physical Therapy

## 2017-03-14 ENCOUNTER — Inpatient Hospital Stay (HOSPITAL_COMMUNITY): Payer: Medicare Other | Admitting: Occupational Therapy

## 2017-03-14 ENCOUNTER — Inpatient Hospital Stay (HOSPITAL_COMMUNITY): Payer: Medicare Other | Admitting: *Deleted

## 2017-03-14 ENCOUNTER — Encounter (HOSPITAL_COMMUNITY): Payer: Self-pay | Admitting: Neurology

## 2017-03-14 LAB — GLUCOSE, CAPILLARY
Glucose-Capillary: 110 mg/dL — ABNORMAL HIGH (ref 65–99)
Glucose-Capillary: 112 mg/dL — ABNORMAL HIGH (ref 65–99)
Glucose-Capillary: 122 mg/dL — ABNORMAL HIGH (ref 65–99)
Glucose-Capillary: 76 mg/dL (ref 65–99)

## 2017-03-14 NOTE — Progress Notes (Signed)
[place] PHYSICAL MEDICINE & REHABILITATION     PROGRESS NOTE  Subjective/Complaints:  Working with therapy. No new issues this morning. Denies pain.   ROS: pt denies nausea, vomiting, diarrhea, cough, shortness of breath or chest pain   Objective: Vital Signs: Blood pressure (!) 134/52, pulse 78, temperature 97.7 F (36.5 C), temperature source Oral, resp. rate 16, weight 60.4 kg (133 lb 3.2 oz), SpO2 98 %. No results found. No results for input(s): WBC, HGB, HCT, PLT in the last 72 hours.  Recent Labs  03/13/17 0435  NA 140  K 3.7  CL 108  GLUCOSE 107*  BUN 14  CREATININE 0.58  CALCIUM 8.7*   CBG (last 3)   Recent Labs  03/13/17 1655 03/13/17 2336 03/14/17 0608  GLUCAP 95 107* 110*    Wt Readings from Last 3 Encounters:  03/14/17 60.4 kg (133 lb 3.2 oz)  03/07/17 64.9 kg (143 lb)  01/18/17 67.6 kg (149 lb 0.5 oz)    Physical Exam:  BP (!) 134/52 (BP Location: Right Arm)   Pulse 78   Temp 97.7 F (36.5 C) (Oral)   Resp 16   Wt 60.4 kg (133 lb 3.2 oz)   SpO2 98%   BMI 22.86 kg/m  Constitutional: She appears well-developed and well-nourished.  HENT: Normocephalic and atraumatic.  Eyes: EOM are normal. No discharge.   Cardiovascular: RRR without murmur. No JVD  Respiratory: CTA Bilaterally without wheezes or rales. Normal effort    GI: Soft. Bowel sounds are normal.   Musculoskeletal: She exhibits no edema or tenderness.  Neurological: She is alert and oriented.  Extremely HOH.  Minimal dysarthria.  Motor: RUE/RLE: 5/5 proximal to distal LUE: 4+/5 with apraxia LLE: HF 4/5, KE 3-/5, ADF/PF 2-/5---no changes.  Skin: Skin is warm and dry.  Psychiatric: She has a normal mood and affect. Her behavior is normal. Thought content normal.    Assessment/Plan: 1. Functional deficits secondary to right CVA which require 3+ hours per day of interdisciplinary therapy in a comprehensive inpatient rehab setting. Physiatrist is providing close team supervision and  24 hour management of active medical problems listed below. Physiatrist and rehab team continue to assess barriers to discharge/monitor patient progress toward functional and medical goals.  Function:  Bathing Bathing position   Position: Wheelchair/chair at sink  Bathing parts Body parts bathed by patient: Left arm, Chest, Abdomen, Front perineal area, Right arm, Buttocks (pt declined bathing legs) Body parts bathed by helper: Back  Bathing assist        Upper Body Dressing/Undressing Upper body dressing   What is the patient wearing?: Bra, Pull over shirt/dress Bra - Perfomed by patient: Thread/unthread right bra strap, Thread/unthread left bra strap Bra - Perfomed by helper: Hook/unhook bra (pull down sports bra) Pull over shirt/dress - Perfomed by patient: Thread/unthread right sleeve, Thread/unthread left sleeve, Put head through opening Pull over shirt/dress - Perfomed by helper: Pull shirt over trunk        Upper body assist Assist Level: Touching or steadying assistance(Pt > 75%)      Lower Body Dressing/Undressing Lower body dressing   What is the patient wearing?: Underwear, Pants, Non-skid slipper socks Underwear - Performed by patient: Thread/unthread right underwear leg Underwear - Performed by helper: Thread/unthread left underwear leg, Pull underwear up/down Pants- Performed by patient: Thread/unthread right pants leg Pants- Performed by helper: Thread/unthread left pants leg, Pull pants up/down   Non-skid slipper socks- Performed by helper: Don/doff right sock, Don/doff left sock  Socks - Performed by helper: Don/doff right sock, Don/doff left sock   Shoes - Performed by helper: Don/doff right shoe, Don/doff left shoe          Lower body assist Assist for lower body dressing: Touching or steadying assistance (Pt > 75%) (max A with balance in stand due to left lean)      Toileting Toileting Toileting activity did not occur: No continent bowel/bladder  event Toileting steps completed by patient: Adjust clothing prior to toileting, Performs perineal hygiene Toileting steps completed by helper: Adjust clothing after toileting Toileting Assistive Devices: Grab bar or rail  Toileting assist Assist level: Touching or steadying assistance (Pt.75%)   Transfers Chair/bed transfer   Chair/bed transfer method: Stand pivot Chair/bed transfer assist level: Moderate assist (Pt 50 - 74%/lift or lower) Chair/bed transfer assistive device: Armrests     Locomotion Ambulation     Max distance: 25 Assist level: Moderate assist (Pt 50 - 74%)   Wheelchair   Type: Manual Max wheelchair distance: 150 Assist Level: Touching or steadying assistance (Pt > 75%)  Cognition Comprehension Comprehension assist level: Understands basic 75 - 89% of the time/ requires cueing 10 - 24% of the time  Expression Expression assist level: Expresses basic needs/ideas: With no assist  Social Interaction Social Interaction assist level: Interacts appropriately with others with medication or extra time (anti-anxiety, antidepressant).  Problem Solving Problem solving assist level: Solves basic 75 - 89% of the time/requires cueing 10 - 24% of the time  Memory Memory assist level: Recognizes or recalls 75 - 89% of the time/requires cueing 10 - 24% of the time    Medical Problem List and Plan: 1.   Left sided weakness, cognitive deficits with no safety awareness and hard to redirect secondary to right CVA on 6/27.  Continue CIR therapies 2.  DVT Prophylaxis/Anticoagulation: Pharmaceutical: Pradexa 3. Pain Management: tylenol prn 4. Mood: LCSW to follow for evaluation and support 5. Neuropsych: This patient is capable of making decisions on her own behalf. 6. Skin/Wound Care: routine pressure relief measures.  7. Fluids/Electrolytes/Nutrition: Monitor I/O.  8. Anemia:   Hb 11.4 on 6/30  Cont to monitor 9. DM diet controlled: Hgb A1c- 6.0.  BS stable overall will check BS  bid with SSI as needed. Poor intake--d/ced cardiac restrictions and continue CM diet.   Fair control as of present 10. HTN: monitor BP bid and avoid hypotension.  Continue atenolol daily.    -reasonable control at present 11. Hypokalemia  -K+ 3.3 on 6/30---3.7 7/3  -continue to replete    12. Hypoalbuminemia  Supplement initiated 6/30  LOS (Days) 5 A FACE TO FACE EVALUATION WAS PERFORMED  Kaydn Kumpf T 03/14/2017 9:28 AM

## 2017-03-14 NOTE — Progress Notes (Signed)
Physical Therapy Session Note  Patient Details  Name: Christy Sutton MRN: [id number] Date of Birth: 12/05/1925  Today's Date: 03/14/2017 PT Individual Time: 0820-0900 and 1300-1400 PT Individual Time Calculation (min): 40 min and 60 min (total 100 min)   Short Term Goals: Week 1:  PT Short Term Goal 1 (Week 1): pt will demonstrate dynamic sitting during a functional task with min assist PT Short Term Goal 2 (Week 1): pt will ambulate 65' with LRAD PT Short Term Goal 3 (Week 1): pt will transfer with consistent min assist  PT Short Term Goal 4 (Week 1): pt will demonstrate emergent awareness with mod cues   Skilled Therapeutic Interventions/Progress Updates: Tx 1: Pt received seated in bed eating breakfast; declines participation until finished eating. Encouraged pt to get up into chair to make self-feeding easier and safer however pt declined. Missed 20 min PT while eating breakfast. Upon therapist's return pt requesting to use restroom. Bed mobility minA with HOB flat and bedrails. MinA sitting balance while scooting hips forward d/t posterior LOB. [place] stand pivot transfer bed>w/c and w/c <>toilet with grab bars. Requires minA for managing clothing. Pt dons pants modA for threading LLE and pulling pants over hips. Repetitive multimodal cueing for upright posture d/t poor LLE activation and impaired midline orientation. Shirt donned minA with cues for attention to LUE. W/c propulsion to/from room BUE for LLE attention and NMR. Gait with R rail and modA 2x25', cues for slowing performance, attention to LLE, and tactile cueing/facilitation at L glute for stance control. Visual cues utilized to reduce LLE scissoring. Remained seated in w/c at end of session, quick release belt intact and all needs in reach.   Tx 2: pt received seated in w/c, denies pain and agreeable to treatment. Transported to/from gym totalA for energy conservation. Stand pivot transfer w/c <>mat table with min/modA. Standing balance  with RUE reaching to R side, cueing for facilitation at LLE for stance control. Standing alternating RLE taps to 3" step with maxA for LLE stance control and balance for focus on weight shifting/acceptance on LLE. Sidelying hip abduction 2x10 reps LLE; max cues for technique. Tall kneeling requiring +2 to attain position, then performed R/L side steps for hip stability and abduction strength. Returned to room as above. Remained seated in w/c at end of session, quick release belt intact and all needs in reach. NT alerted to pt request to use restroom.      Therapy Documentation Precautions:  Precautions Precautions: Fall Precaution Comments: L hemiparesis, impulsive, HOH Restrictions Weight Bearing Restrictions: No General: PT Amount of Missed Time (min): 20 Minutes PT Missed Treatment Reason: Other (Comment) (eating breakfast, declines)   See Function Navigator for Current Functional Status.   Therapy/Group: Individual Therapy  [name] 03/14/2017, 9:34 AM

## 2017-03-14 NOTE — Progress Notes (Signed)
Physical Therapy Session Note  Patient Details  Name: [name] MRN: [id number] Date of Birth: 1925/10/20  Today's Date: 03/14/2017 PT Individual Time: 1002-1027 PT Individual Time Calculation (min): 25 min    Skilled Therapeutic Interventions/Progress Updates:    Session initiated with pt up in chair.  Pt agreeable to session.  Pt transported to gym dependently for time management.  Pt performed numerous sets/reps of sit to stand and mat to chair to attempt to promote improved safety and use of LE during mobility.  Pt continues to demonstrate increased impulsivity with function and requires manual and verbal cue for use of L LE, however, due to being [facility], verbal cues are minimally effective.  Pt additionally worked on standing tolerance at table while folding washclothes and propelled w/c back to room using bilateral UE.  Following session, pt left up in wheelchair with lap belt donned and call bell and phone in reach.  Therapy Documentation Precautions:  Precautions Precautions: Fall Precaution Comments: L hemiparesis, impulsive, HOH Restrictions Weight Bearing Restrictions: No   See Function Navigator for Current Functional Status.   Therapy/Group: Individual Therapy  Cage Gupton Hilario Quarry 03/14/2017, 12:25 PM

## 2017-03-14 NOTE — Progress Notes (Signed)
Social Work Patient ID: Christy Sutton, female   DOB: 11-28-1925, 83 y.o.   MRN: [id number]   CSW met with pt to update her on team conference discussion and spoke with pt's dtr-in-law, Christy Sutton, via telephone to update her as well.  She was pleased to know that pt would be on CIR until 03-31-17 to get more rehabilitation.  Family will pull together 24/7 care for pt.  CSW explained to Christy Sutton that pt will need supervision to minimal assistance at home and she feels they can handle that.  CSW will continue to follow and assist as needed.

## 2017-03-14 NOTE — Patient Care Conference (Signed)
Inpatient RehabilitationTeam Conference and Plan of Care Update Date: 03/13/2017   Time: 2:15 PM    Patient Name: Christy Sutton      Medical Record Number: [id number]  Date of Birth: November 04, 1925 Sex: Female         Room/Bed: 4W05C/4W05C-01 Payor Info: Payor: MEDICARE / Plan: MEDICARE PART A AND B / Product Type: *No Product type* /    Admitting Diagnosis: CVA  Admit Date/Time:  03/09/2017  4:24 PM Admission Comments: No comment available   Primary Diagnosis:  <principal problem not specified> Principal Problem: <principal problem not specified>  Patient Active Problem List   Diagnosis Date Noted  . Left hemiparesis ([place])   . Acute blood loss anemia   . Benign essential HTN   . Hypokalemia   . Hypoalbuminemia due to protein-calorie malnutrition ([place])   . Stroke due to embolism ([place]) 03/09/2017  . Diabetes mellitus type 2 in nonobese (HCC)   . History of CVA (cerebrovascular accident)   . Cerebral embolism with cerebral infarction 03/07/2017  . Stroke ([place]) 01/17/2017  . TIA (transient ischemic attack) 01/17/2017  . Dysmetria 01/17/2017  . Essential hypertension 01/17/2017  . Diabetes mellitus with complication ([place]) 24/82/5003  . Permanent atrial fibrillation ([place]) 01/17/2017  . Ischemic stroke [facility])     Expected Discharge Date: Expected Discharge Date: 03/31/17  Team Members Present: Physician leading conference: Dr. Alger Simons Social Worker Present: Alfonse Alpers, LCSW Nurse Present: Other (comment) Genene Churn, RN) PT Present: Kem Parkinson, PT OT Present: Roanna Epley, [place], OT SLP Present: Weston Anna, SLP PPS Coordinator present : Daiva Nakayama, RN, CRRN     Current Status/Progress Goal Weekly Team Focus  Medical   right CVA PLIC/thalamus,embolic due to afib, HOH. has left HP  improve functional mobility  bp control, nutrition, HR control/anitcoagulation   Bowel/Bladder   Continent of bowel and bladder LBM 03-12-17  Remain continent of bowel  and bladder, maintain regular bowel pattern  Assist with tolieting needs prn   Swallow/Nutrition/ Hydration             ADL's   min-mod UB self care, mod-max LB self care, mod-max stand pivot transfer, left lean with impaired midline awareness and impaired attention to body alignment  min A overall  cognitive awareness for safety, postural control and awareness, ADL training, pt/family educ.   Mobility   minA bed mobility, modA transfers, max/totalA gait, very impulsive with poor awareness of deficits  S bed mobility and transfers, minA gait in home and stairs for strengthening  L attention, L NMR, dynamic sitting/standing balance, safety awareness   Communication             Safety/Cognition/ Behavioral Observations            Pain   no c/o pain   > or = 3  Assess pain q shift and prn   Skin   stage 1 to bottom allevyn in place  no new skin breakdown/injury   Assess skin q shift and prn    Rehab Goals Patient on target to meet rehab goals: Yes Rehab Goals Revised: none - this is pt's first conference *See Care Plan and progress notes for long and short-term goals.  Barriers to Discharge: left hemiparesis, poor hearing    Possible Resolutions to Barriers:  adaptive equipment and techniques, continued strengthening    Discharge Planning/Teaching Needs:  Pt will return home with her family and they will arrange 24/7 care.  Family will come in to go through  family education prior to d/c.   Team Discussion:  Pt is max assist with sit to stand and then was min assist for clothing management.  Is somewhat disorganized.  Pt is getting some return in ULE and slight return in LLE.  Pt is motivated to rehabilitate.  Pt is continent of bowel and bladder.  Blood sugars and blood pressures are good.  Revisions to Treatment Plan:  none   Continued Need for Acute Rehabilitation Level of Care: The patient requires daily medical management by a physician with specialized training in physical  medicine and rehabilitation for the following conditions: Daily direction of a multidisciplinary physical rehabilitation program to ensure safe treatment while eliciting the highest outcome that is of practical value to the patient.: Yes Daily medical management of patient stability for increased activity during participation in an intensive rehabilitation regime.: Yes Daily analysis of laboratory values and/or radiology reports with any subsequent need for medication adjustment of medical intervention for : Neurological problems;Blood pressure problems;Cardiac problems  Mohanad Carsten, [name] 03/14/2017, 10:25 AM

## 2017-03-14 NOTE — Progress Notes (Signed)
Occupational Therapy Session Note  Patient Details  Name: Christy Sutton MRN: [id number] Date of Birth: 06/19/1926  Today's Date: 03/14/2017 OT Individual Time: 1100-1200 OT Individual Time Calculation (min): 60 min    Short Term Goals: Week 1:  OT Short Term Goal 1 (Week 1): Pt will complete 2/3 components of LB dressing in supported seated position  OT Short Term Goal 2 (Week 1): Pt will complete toilet or BSC transfer with Mod A OT Short Term Goal 3 (Week 1): Pt will complete shower transfer with Max A and LRAD OT Short Term Goal 4 (Week 1): Pt will don shirt with supervision   Skilled Therapeutic Interventions/Progress Updates:    Pt seen for OT session focusing on functional standing  Balance/ endurance. Pt sitting up in w/c upon arrival, agreeable to tx session and denying bathing/dressing this morning. She self propelled w/c to therapy gym with supervision for UE strengthening. Competed stand pivot transfers throughout session with min A to stand however mod-max for controled turn to w/c and mat.  Stood at [employer] to remove clothes pins from around waist band in simulation of LB dressing task. She required min-mod A overall for dynamic standing balance with decreased weightbearing noted through L LE. Completed sit <> Stand from standard chair focusing on controlled sit, pt requiring max VCs and min-mod A for controlled descent. Completed toe tapping activity with R LE allowing weightbearing through L LE. Completed x3 trials. Pt returned to w/c at end of session and taken back to room. Completed mod A stand pivot to toilet with use of grab bars. With heavy steadying assist pt able to manage clothing. Pt left seated on toilet with NT present for hand off of supervision.   Therapy Documentation Precautions:  Precautions Precautions: Fall Precaution Comments: L hemiparesis, impulsive, HOH Restrictions Weight Bearing Restrictions: No Pain:   No/ denies pain ADL: ADL ADL Comments: Please see  functional navigator for ADL status  See Function Navigator for Current Functional Status.   Therapy/Group: Individual Therapy  Lewis, Sheryl Saintil C 03/14/2017, 6:50 AM

## 2017-03-14 NOTE — Progress Notes (Signed)
Social Work Patient ID: Christy Sutton, female   DOB: [birth year], 83 y.o.   MRN: [id number]   Domenik Trice, [name], LCSW Social Worker Signed   Patient Care Conference Date of Service: 03/14/2017 10:25 AM      Hide copied text Hover for attribution information Inpatient RehabilitationTeam Conference and Plan of Care Update Date: 03/13/2017   Time: 2:15 PM      Patient Name: Christy Sutton      Medical Record Number: [id number]  Date of Birth: [birth year] Sex: Female         Room/Bed: 4W05C/4W05C-01 Payor Info: Payor: MEDICARE / Plan: MEDICARE PART A AND B / Product Type: *No Product type* /     Admitting Diagnosis: CVA  Admit Date/Time:  03/09/2017  4:24 PM Admission Comments: No comment available    Primary Diagnosis:  <principal problem not specified> Principal Problem: <principal problem not specified>       Patient Active Problem List    Diagnosis Date Noted  . Left hemiparesis ([place])    . Acute blood loss anemia    . Benign essential HTN    . Hypokalemia    . Hypoalbuminemia due to protein-calorie malnutrition ([place])    . Stroke due to embolism ([place]) 03/09/2017  . Diabetes mellitus type 2 in nonobese (HCC)    . History of CVA (cerebrovascular accident)    . Cerebral embolism with cerebral infarction 03/07/2017  . Stroke ([place]) 01/17/2017  . TIA (transient ischemic attack) 01/17/2017  . Dysmetria 01/17/2017  . Essential hypertension 01/17/2017  . Diabetes mellitus with complication ([place]) 70/62/3762  . Permanent atrial fibrillation ([place]) 01/17/2017  . Ischemic stroke [facility])        Expected Discharge Date: Expected Discharge Date: 03/31/17   Team Members Present: Physician leading conference: Dr. Alger Simons Social Worker Present: Alfonse Alpers, LCSW Nurse Present: Other (comment) Genene Churn, RN) PT Present: Kem Parkinson, PT OT Present: Roanna Epley, [place], OT SLP Present: Weston Anna, SLP PPS Coordinator present : Daiva Nakayama, RN, CRRN       Current  Status/Progress Goal Weekly Team Focus  Medical     right CVA PLIC/thalamus,embolic due to afib, HOH. has left HP  improve functional mobility  bp control, nutrition, HR control/anitcoagulation   Bowel/Bladder     Continent of bowel and bladder LBM 03-12-17  Remain continent of bowel and bladder, maintain regular bowel pattern  Assist with tolieting needs prn   Swallow/Nutrition/ Hydration               ADL's     min-mod UB self care, mod-max LB self care, mod-max stand pivot transfer, left lean with impaired midline awareness and impaired attention to body alignment  min A overall  cognitive awareness for safety, postural control and awareness, ADL training, pt/family educ.   Mobility     minA bed mobility, modA transfers, max/totalA gait, very impulsive with poor awareness of deficits  S bed mobility and transfers, minA gait in home and stairs for strengthening  L attention, L NMR, dynamic sitting/standing balance, safety awareness   Communication               Safety/Cognition/ Behavioral Observations             Pain     no c/o pain   > or = 3  Assess pain q shift and prn   Skin     stage 1 to bottom allevyn in place  no new skin breakdown/injury   Assess  skin q shift and prn     Rehab Goals Patient on target to meet rehab goals: Yes Rehab Goals Revised: none - this is pt's first [place] and progress notes for long and short-term goals.   Barriers to Discharge: left hemiparesis, poor hearing     Possible Resolutions to Barriers:  adaptive equipment and techniques, continued strengthening     Discharge Planning/Teaching Needs:  Pt will return home with her family and they will arrange 24/7 care.  Family will come in to go through family education prior to d/c.   Team Discussion:  Pt is max assist with sit to stand and then was min assist for clothing management.  Is somewhat disorganized.  Pt is getting some return in ULE and slight return in LLE.  Pt is  motivated to rehabilitate.  Pt is continent of bowel and bladder.  Blood sugars and blood pressures are good.  Revisions to Treatment Plan:  none    Continued Need for Acute Rehabilitation Level of Care: The patient requires daily medical management by a physician with specialized training in physical medicine and rehabilitation for the following conditions: Daily direction of a multidisciplinary physical rehabilitation program to ensure safe treatment while eliciting the highest outcome that is of practical value to the patient.: Yes Daily medical management of patient stability for increased activity during participation in an intensive rehabilitation regime.: Yes Daily analysis of laboratory values and/or radiology reports with any subsequent need for medication adjustment of medical intervention for : Neurological problems;Blood pressure problems;Cardiac problems   [place] Maret, [name] 03/14/2017, 10:25 AM

## 2017-03-14 NOTE — Evaluation (Signed)
Recreational Therapy Assessment and Plan  Patient Details  Name: Oralee Rapaport MRN: [id number] Date of Birth: 21-Sep-1925 Today's Date: 03/14/2017  Rehab Potential:  Good ELOS:   discharge 7/21  Assessment  Problem List:      Patient Active Problem List   Diagnosis Date Noted  . Acute blood loss anemia   . Benign essential HTN   . Hypokalemia   . Hypoalbuminemia due to protein-calorie malnutrition ([place])   . Stroke due to embolism ([place]) 03/09/2017  . Diabetes mellitus type 2 in nonobese (HCC)   . History of CVA (cerebrovascular accident)   . Cerebral embolism with cerebral infarction 03/07/2017  . Stroke ([place]) 01/17/2017  . TIA (transient ischemic attack) 01/17/2017  . Dysmetria 01/17/2017  . Essential hypertension 01/17/2017  . Diabetes mellitus with complication ([place]) 29/51/8841  . Permanent atrial fibrillation ([place]) 01/17/2017  . Ischemic stroke [facility])     Past Medical History:      Past Medical History:  Diagnosis Date  . Atrial fibrillation ([place])   . Diabetes mellitus without complication ([place])   . Hyperlipidemia   . Hypertension   . TIA (transient ischemic attack)    Past Surgical History:       Past Surgical History:  Procedure Laterality Date  . ABDOMINAL HYSTERECTOMY    . TEE WITHOUT CARDIOVERSION N/A 01/30/2017   Procedure: TRANSESOPHAGEAL ECHOCARDIOGRAM (TEE);  Surgeon: Adrian Prows, MD;  Location: [facility] ENDOSCOPY;  Service: Cardiovascular;  Laterality: N/A;  . TUBAL LIGATION      Assessment & Plan Clinical Impression: Erandy Mceachern a 83 y.o.femalewith history of DM diet controlled, HTN, CVA 4/18 and 5/18, A fib on eliquis who was admitted on 03/07/2017 with worsening of left sided weakness with numbness, left facial droop and concerns of stroke extension. MRI brain reviewed, showing right CVA. Per report, restricted diffusion in right posterior limb internal capsule and thalamus consistent with acute infarct adjacent to area of prior  ischemia and no stenosis or occlusion. Dr. Leonie Man felt that stroke embolic likely due to A fib and recommended changing Eliquis to Pradaxa. Patient with resultant left sided weakness, cognitive deficits with no safety awareness and hard to redirect. CIR recommended due to deficits in mobility and decreased ability to carry out ADL tasks.  Patient transferred to CIR on 03/09/2017.   Pt presents with decreased activity tolerance, decreased functional mobility, decreased balance, decreased awareness of midline, left inattention, decreased safety awareness, decreased memory, decreased problem solving, & delayed processing limiting pt's independence with leisure/community pursuits.   Plan Min 1 TR session/group during LOS >20 minutes  Recommendations for other services: None   Discharge Criteria: Patient will be discharged from TR if patient refuses treatment 3 consecutive times without medical reason.  If treatment goals not met, if there is a change in medical status, if patient makes no progress towards goals or if patient is discharged from hospital.  The above assessment, treatment plan, treatment alternatives and goals were discussed and mutually agreed upon: by patient  [place] 03/14/2017, 8:33 AM

## 2017-03-15 ENCOUNTER — Inpatient Hospital Stay (HOSPITAL_COMMUNITY): Payer: Medicare Other | Admitting: Occupational Therapy

## 2017-03-15 ENCOUNTER — Inpatient Hospital Stay (HOSPITAL_COMMUNITY): Payer: Medicare Other

## 2017-03-15 ENCOUNTER — Inpatient Hospital Stay (HOSPITAL_COMMUNITY): Payer: Medicare Other | Admitting: *Deleted

## 2017-03-15 ENCOUNTER — Encounter (HOSPITAL_COMMUNITY): Payer: Self-pay

## 2017-03-15 ENCOUNTER — Inpatient Hospital Stay (HOSPITAL_COMMUNITY): Payer: Medicare Other | Admitting: Physical Therapy

## 2017-03-15 DIAGNOSIS — I6349 Cerebral infarction due to embolism of other cerebral artery: Secondary | ICD-10-CM

## 2017-03-15 LAB — GLUCOSE, CAPILLARY
GLUCOSE-CAPILLARY: 92 mg/dL (ref 65–99)
GLUCOSE-CAPILLARY: 162 mg/dL — AB (ref 65–99)
GLUCOSE-CAPILLARY: 56 mg/dL — AB (ref 65–99)
GLUCOSE-CAPILLARY: 85 mg/dL (ref 65–99)

## 2017-03-15 NOTE — Plan of Care (Signed)
Problem: RH BLADDER ELIMINATION Goal: RH STG MANAGE BLADDER WITH ASSISTANCE STG Manage Bladder With mod Assistance  Outcome: Progressing continenet of bladder, up to [facility] with 2 assistants Goal: RH STG MANAGE BLADDER WITH EQUIPMENT WITH ASSISTANCE STG Manage Bladder With Equipment With mod Assistance  Outcome: Progressing BSC  Problem: RH SKIN INTEGRITY Goal: RH STG SKIN FREE OF INFECTION/BREAKDOWN Skin free of infection/breakdown with min assistance  Outcome: Progressing Skin dry and intact, foam to sacrum prophylaxis, no skin issues noted  Problem: RH SAFETY Goal: RH STG ADHERE TO SAFETY PRECAUTIONS W/ASSISTANCE/DEVICE STG Adhere to Safety Precautions With min Assistance/Device.  Outcome: Progressing Safety precautions maintained, no fall or injury noted  Problem: RH PAIN MANAGEMENT Goal: RH STG PAIN MANAGED AT OR BELOW PT'S PAIN GOAL N/A  Outcome: Progressing Denies pain and discomfort

## 2017-03-15 NOTE — Progress Notes (Signed)
Physical Therapy Session Note  Patient Details  Name: Christy Sutton MRN: [id number] Date of Birth: 19-May-1926  Today's Date: 03/15/2017 PT Individual Time: 1400-1430 PT Individual Time Calculation (min): 30 min   Short Term Goals: Week 1:  PT Short Term Goal 1 (Week 1): pt will demonstrate dynamic sitting during a functional task with min assist PT Short Term Goal 2 (Week 1): pt will ambulate 73' with LRAD PT Short Term Goal 3 (Week 1): pt will transfer with consistent min assist  PT Short Term Goal 4 (Week 1): pt will demonstrate emergent awareness with mod cues   Skilled Therapeutic Interventions/Progress Updates:    Functional bed mobility to come to EOB with supervision and extra time - verbal cues for safety and awareness of postural control once EOB as pt losing balance posterior. Mod assist stand pivot transfer to w/c with max cues for safety due to impulsivity and HOH. Neuro re-ed to address gait, postural control, weightshifting and coordinated movement of LLE. Used rail in hallway for support (min assist to come into standing) with RUE and placed wooden plank between LE's to increase BOS with mod assist overall x 30' x 45' with facilitation of weightshift and assist for postural control. Pt advancing LLE without assist. Static standing without UE support for postural control re-training with emphasis on pt utilizing trunk and LE's to correct for balance instead of using RUE on rail. Pt very impulsive during session and HOH also decreased ability to follow commands.   Therapy Documentation Precautions:  Precautions Precautions: Fall Precaution Comments: L hemiparesis, impulsive, HOH Restrictions Weight Bearing Restrictions: No  Pain: Pain Assessment Pain Assessment: No/denies pain   See Function Navigator for Current Functional Status.   Therapy/Group: Individual Therapy  Christy Sutton, PT, DPT  03/15/2017, 2:34 PM

## 2017-03-15 NOTE — Significant Event (Signed)
Hypoglycemic Event  CBG: 56 Treatment: 15 GM carbohydrate snack  Symptoms: None  Follow-up CBG: Time:1639 CBG Result:85  Possible Reasons for Event: Unknown  Comments/MD notified:Patient did not consume enough of her afternoon lunch tray. Gave patient Nilla wafers, peanut butter, and Coke. Patient had no s/s of event.    Creig Hines, Susa Raring

## 2017-03-15 NOTE — Progress Notes (Signed)
Physical Therapy Session Note  Patient Details  Name: Christy Sutton MRN: [id number] Date of Birth: [birth year]  Today's Date: 03/15/2017 PT Individual Time: 1000-1100 PT Individual Time Calculation (min): 60 min   Short Term Goals: Week 1:  PT Short Term Goal 1 (Week 1): pt will demonstrate dynamic sitting during a functional task with min assist PT Short Term Goal 2 (Week 1): pt will ambulate 81' with LRAD PT Short Term Goal 3 (Week 1): pt will transfer with consistent min assist  PT Short Term Goal 4 (Week 1): pt will demonstrate emergent awareness with mod cues   Skilled Therapeutic Interventions/Progress Updates: Pt received seated in w/c, denies pain and agreeable to treatment. W/c propulsion to/from gym with BUE and S for LUE NMR. Gait with RW, LLE hand splint; maxA overall with multimodal cueing and facilitation for LLE placement. Pt demo's significant compensatory patterns including R lateral trunk lean to improve LLE foot clearance and significant trendelenburg L hip; difficult to correct d/t impulsivity and pt HOH. Pt able to demonstrate 3/5 L ankle dorsiflexion strength with significant effort; suspect poor awareness to DF during swing phase also limiting foot clearance. Gait with no AD and dorsiflexion ace wrap assist; improved foot clearance however continues with compensatory patterns despite cueing/facilitation. Side stepping R/L with BUE on handrail in hall; cueing for L hip extension in stance, foot placement to improve L hip abduction. Performed nustep x8 min with BUE/BLE level 4 with average 50 steps/min for focus on reciprocal stepping pattern and LUE/LLE NMR. Returned to room with w/c propulsion as described above. Remained seated in w/c with quick release belt intact and all needs in reach.       Therapy Documentation Precautions:  Precautions Precautions: Fall Precaution Comments: L hemiparesis, impulsive, HOH Restrictions Weight Bearing Restrictions: No   See Function  Navigator for Current Functional Status.   Therapy/Group: Individual Therapy  [name] 03/15/2017, 10:45 AM

## 2017-03-15 NOTE — Progress Notes (Signed)
[facility] PHYSICAL MEDICINE & REHABILITATION     PROGRESS NOTE  Subjective/Complaints:  Working with therapy. No new issues this morning. Denies pain.   ROS: pt denies nausea, vomiting, diarrhea, cough, shortness of breath or chest pain   Objective: Vital Signs: Blood pressure (!) 153/72, pulse 78, temperature 98.1 F (36.7 C), temperature source Oral, resp. rate 17, weight 60.4 kg (133 lb 3.2 oz), SpO2 100 %. No results found. No results for input(s): WBC, HGB, HCT, PLT in the last 72 hours.  Recent Labs  03/13/17 0435  NA 140  K 3.7  CL 108  GLUCOSE 107*  BUN 14  CREATININE 0.58  CALCIUM 8.7*   CBG (last 3)   Recent Labs  03/14/17 1655 03/14/17 2348 03/15/17 0543  GLUCAP 76 112* 92    Wt Readings from Last 3 Encounters:  03/14/17 60.4 kg (133 lb 3.2 oz)  03/07/17 64.9 kg (143 lb)  01/18/17 67.6 kg (149 lb 0.5 oz)    Physical Exam:  BP (!) 153/72 (BP Location: Left Arm)   Pulse 78   Temp 98.1 F (36.7 C) (Oral)   Resp 17   Wt 60.4 kg (133 lb 3.2 oz)   SpO2 100%   BMI 22.86 kg/m  Constitutional: She appears well-developed and well-nourished.  HENT: Normocephalic and atraumatic.  Eyes: EOM are normal. No discharge.   Cardiovascular: RRR without murmur. No JVD  Respiratory: CTA Bilaterally without wheezes or rales. Normal effort  GI: Soft. Bowel sounds are normal.   Musculoskeletal: She exhibits no edema or tenderness.  Neurological: She is alert and oriented.  Extremely HOH.  Minimal dysarthria.  Motor: RUE/RLE: 5/5 proximal to distal LUE: 4+/5 with apraxia LLE: HF 4/5, KE 3-/5, ADF/PF 2-/5---no changes.  Skin: Skin is warm and dry.  Psychiatric: She has a normal mood and affect. Her behavior is normal. Thought content normal.    Assessment/Plan: 1. Functional deficits secondary to right CVA which require 3+ hours per day of interdisciplinary therapy in a comprehensive inpatient rehab setting. Physiatrist is providing close team supervision and 24  hour management of active medical problems listed below. Physiatrist and rehab team continue to assess barriers to discharge/monitor patient progress toward functional and medical goals.  Function:  Bathing Bathing position   Position: Wheelchair/chair at sink  Bathing parts Body parts bathed by patient: Left arm, Chest, Abdomen, Front perineal area, Right arm, Buttocks, Right upper leg, Left upper leg, Right lower leg, Left lower leg Body parts bathed by helper: Back  Bathing assist Assist Level: Touching or steadying assistance(Pt > 75%)      Upper Body Dressing/Undressing Upper body dressing   What is the patient wearing?: Pull over shirt/dress, Bra Bra - Perfomed by patient: Thread/unthread right bra strap, Thread/unthread left bra strap Bra - Perfomed by helper: Hook/unhook bra (pull down sports bra) Pull over shirt/dress - Perfomed by patient: Thread/unthread right sleeve, Thread/unthread left sleeve, Put head through opening, Pull shirt over trunk Pull over shirt/dress - Perfomed by helper: Pull shirt over trunk        Upper body assist Assist Level: Touching or steadying assistance(Pt > 75%)      Lower Body Dressing/Undressing Lower body dressing   What is the patient wearing?: Underwear, Pants, Non-skid slipper socks Underwear - Performed by patient: Thread/unthread left underwear leg Underwear - Performed by helper: Thread/unthread right underwear leg, Pull underwear up/down Pants- Performed by patient: Thread/unthread left pants leg, Pull pants up/down Pants- Performed by helper: Thread/unthread right pants leg  Non-skid slipper socks- Performed by helper: Don/doff right sock, Don/doff left sock   Socks - Performed by helper: Don/doff right sock, Don/doff left sock   Shoes - Performed by helper: Don/doff right shoe, Don/doff left shoe          Lower body assist Assist for lower body dressing: Touching or steadying assistance (Pt > 75%) (MaxA  to steady when  standing to pull over hips)      Toileting Toileting Toileting activity did not occur: No continent bowel/bladder event Toileting steps completed by patient: Adjust clothing prior to toileting, Performs perineal hygiene, Adjust clothing after toileting Toileting steps completed by helper: Adjust clothing after toileting Toileting Assistive Devices: Grab bar or rail  Toileting assist Assist level:  ([place] to steady in standing for clothing management)   Transfers Chair/bed transfer   Chair/bed transfer method: Stand pivot Chair/bed transfer assist level: Moderate assist (Pt 50 - 74%/lift or lower) Chair/bed transfer assistive device: Armrests     Locomotion Ambulation     Max distance: 25 Assist level: Moderate assist (Pt 50 - 74%)   Wheelchair   Type: Manual Max wheelchair distance: 150 Assist Level: Supervision or verbal cues  Cognition Comprehension Comprehension assist level: Understands basic 75 - 89% of the time/ requires cueing 10 - 24% of the time  Expression Expression assist level: Expresses basic needs/ideas: With no assist  Social Interaction Social Interaction assist level: Interacts appropriately with others with medication or extra time (anti-anxiety, antidepressant).  Problem Solving Problem solving assist level: Solves basic 75 - 89% of the time/requires cueing 10 - 24% of the time  Memory Memory assist level: Recognizes or recalls 75 - 89% of the time/requires cueing 10 - 24% of the time    Medical Problem List and Plan: 1.   Left sided weakness, cognitive deficits with no safety awareness and hard to redirect secondary to right CVA on 6/27.  Continue CIR therapies 2.  DVT Prophylaxis/Anticoagulation: Pharmaceutical: Pradexa 3. Pain Management: tylenol prn 4. Mood: LCSW to follow for evaluation and support 5. Neuropsych: This patient is capable of making decisions on her own behalf. 6. Skin/Wound Care: routine pressure relief measures.  7.  Fluids/Electrolytes/Nutrition: Monitor I/O.  8. Anemia:   Hb 11.4 on 6/30  Cont to monitor 9. DM diet controlled: Hgb A1c- 6.0.  BS stable overall will check BS bid with SSI as needed. Poor intake--d/ced cardiac restrictions and continue CM diet.   Fair control as of present 10. HTN: monitor BP bid and avoid hypotension.  Continue atenolol daily.    -reasonable control at present 11. Hypokalemia  -K+ 3.3 on 6/30---3.7 7/3  -continue to replete    12. Hypoalbuminemia  Supplement initiated 6/30  LOS (Days) 6 A FACE TO FACE EVALUATION WAS PERFORMED  SWARTZ,ZACHARY T 03/15/2017 9:13 AM

## 2017-03-15 NOTE — Progress Notes (Signed)
Occupational Therapy Session Note  Patient Details  Name: Christy Sutton MRN: [id number] Date of Birth: Oct 07, 1925  Today's Date: 03/15/2017 OT Individual Time: 3500-9381 OT Individual Time Calculation (min): 45 min    Short Term Goals: Week 1:  OT Short Term Goal 1 (Week 1): Pt will complete 2/3 components of LB dressing in supported seated position  OT Short Term Goal 2 (Week 1): Pt will complete toilet or BSC transfer with Mod A OT Short Term Goal 3 (Week 1): Pt will complete shower transfer with Max A and LRAD OT Short Term Goal 4 (Week 1): Pt will don shirt with supervision   Skilled Therapeutic Interventions/Progress Updates:    Treatment session focused on balance training, gross/fine motor coordination, ADL/self care training, transfer training, and pt education. Pt up in w/c and agreeable to therapy. Pt trained in hand/foot placement for safe sit<>stand transfers using w/c and walker. Pt required tactile cues for weight shifting and wide base of support during standing task. Pt noted to need tactile cues for engaging L LE during standing for weight bearing and equal balance. Pt tolerated standing while completing bilateral and unilateral UE activities for up to ~1.5 mins with min A d/t unsteadiness. Pt noted to have L sided ataxia and was provided with verbal cues for accuracy and steadiness. Pt trained in toilet transfers to increase carry over with safe technique from w/c to raised toilet seat using grab bars. Pt required min A and verbal and tactile cues for hand/foot placement for L LE. Pt did not c/o pain during session. Pt left in w/c with safety belt and call bell in reach.   Therapy Documentation Precautions:  Precautions Precautions: Fall Precaution Comments: L hemiparesis, impulsive, HOH Restrictions Weight Bearing Restrictions: No Pain: Pain Assessment Pain Assessment: No/denies pain ADL: ADL ADL Comments: Please see functional navigator for ADL status  See Function  Navigator for Current Functional Status.   Therapy/Group: Individual Therapy  Delon [place] 03/15/2017, 11:46 AM

## 2017-03-15 NOTE — Progress Notes (Signed)
Occupational Therapy Session Note  Patient Details  Name: Christy Sutton MRN: [id number] Date of Birth: 02-Jun-1926  Today's Date: 03/15/2017 OT Individual Time: 6546-5035 OT Individual Time Calculation (min): 60 min    Short Term Goals: Week 1:  OT Short Term Goal 1 (Week 1): Pt will complete 2/3 components of LB dressing in supported seated position  OT Short Term Goal 2 (Week 1): Pt will complete toilet or BSC transfer with Mod A OT Short Term Goal 3 (Week 1): Pt will complete shower transfer with Max A and LRAD OT Short Term Goal 4 (Week 1): Pt will don shirt with supervision   Skilled Therapeutic Interventions/Progress Updates:    Pt received supine in bed, agreeable to OT treatment session. Focus of session on ADL retraining and functional mobility transfers. Pt completed supine to sitting EOB with MinGuard assist, stand pivot EOB to w/c with MaxA. Pt completed stand pivot w/c<>toilet using grab bar with overall ModA, and ModA to steady patient during clothing management for toileting completion. Pt completed bathing and dressing from w/c level at sink, washing all body parts with assist for washing back and feet. ModA to steady while Pt washes perineal area and buttocks. Pt requires setup assist for donning overhead shirt (total assist for buttoning top two buttons). Pt dons pants and underwear with MaxA, able to thread L LE into both clothing items, requires MaxA to steady while Pt pulls clothing over hips and tucks shirt in. Total assist for footwear. Mod verbal cues during standing for Pt to correct posture as Pt with decreased awareness. Mod verbal cues for safety during sit<>stand transfers as Pt attempting to stand with footrests still under feet. Pt completed grooming ADLs with setup assist. Pt left seated in w/c, breakfast setup, QRB donned, call bell and needs within reach.   Therapy Documentation Precautions:  Precautions Precautions: Fall Precaution Comments: L hemiparesis,  impulsive, HOH Restrictions Weight Bearing Restrictions: No  Pain: Pain Assessment Pain Assessment: No/denies pain ADL: ADL ADL Comments: Please see functional navigator for ADL status  See Function Navigator for Current Functional Status.   Therapy/Group: Individual Therapy  Raymondo Band 03/15/2017, 12:48 PM

## 2017-03-16 ENCOUNTER — Inpatient Hospital Stay (HOSPITAL_COMMUNITY): Payer: Medicare Other

## 2017-03-16 ENCOUNTER — Inpatient Hospital Stay (HOSPITAL_COMMUNITY): Payer: Medicare Other | Admitting: Physical Therapy

## 2017-03-16 ENCOUNTER — Encounter (HOSPITAL_COMMUNITY): Payer: Self-pay

## 2017-03-16 LAB — GLUCOSE, CAPILLARY
Glucose-Capillary: 128 mg/dL — ABNORMAL HIGH (ref 65–99)
Glucose-Capillary: 102 mg/dL — ABNORMAL HIGH (ref 65–99)

## 2017-03-16 LAB — BASIC METABOLIC PANEL
Anion gap: 7 (ref 5–15)
BUN: 18 mg/dL (ref 6–20)
CO2: 25 mmol/L (ref 22–32)
Calcium: 8.7 mg/dL — ABNORMAL LOW (ref 8.9–10.3)
Chloride: 108 mmol/L (ref 101–111)
Creatinine, Ser: 0.63 mg/dL (ref 0.44–1.00)
GFR calc Af Amer: >60 mL/min (ref >60)
GFR calc non Af Amer: >60 mL/min (ref >60)
Glucose, Bld: 99 mg/dL (ref 65–99)
Potassium: 3.8 mmol/L (ref 3.5–5.1)
Sodium: 140 mmol/L (ref 135–145)

## 2017-03-16 NOTE — Progress Notes (Signed)
[facility] PHYSICAL MEDICINE & REHABILITATION     PROGRESS NOTE  Subjective/Complaints:  Up with therapy. No new complaints. Low cbg yesterday afternoon  ROS: pt denies nausea, vomiting, diarrhea, cough, shortness of breath or chest pain   Objective: Vital Signs: Blood pressure (!) 138/50, pulse 71, temperature 98.1 F (36.7 C), temperature source Oral, resp. rate 18, weight 60.4 kg (133 lb 3.2 oz), SpO2 98 %. No results found. No results for input(s): WBC, HGB, HCT, PLT in the last 72 hours.  Recent Labs  03/16/17 0657  NA 140  K 3.8  CL 108  GLUCOSE 99  BUN 18  CREATININE 0.63  CALCIUM 8.7*   CBG (last 3)   Recent Labs  03/15/17 1147 03/15/17 1620 03/15/17 1639  GLUCAP 162* 56* 85    Wt Readings from Last 3 Encounters:  03/14/17 60.4 kg (133 lb 3.2 oz)  03/07/17 64.9 kg (143 lb)  01/18/17 67.6 kg (149 lb 0.5 oz)    Physical Exam:  BP (!) 138/50 (BP Location: Left Arm)   Pulse 71   Temp 98.1 F (36.7 C) (Oral)   Resp 18   Wt 60.4 kg (133 lb 3.2 oz)   SpO2 98%   BMI 22.86 kg/m  Constitutional: She appears well-developed and well-nourished.  HENT: Normocephalic and atraumatic.  Eyes: EOM are normal. No discharge.   Cardiovascular: RRR without murmur. No JVD   Respiratory: CTA Bilaterally without wheezes or rales. Normal effort  GI: Soft. Bowel sounds are normal.   Musculoskeletal: She exhibits no edema or tenderness.  Neurological: She is alert and oriented.  Extremely HOH.  Minimal dysarthria.  Motor: RUE/RLE: 5/5 proximal to distal LUE: 4+/5 with apraxia LLE: HF 4/5, KE 3-/5, ADF/PF 2-/5---stable.  Skin: Skin is warm and dry.  Psychiatric: She has a normal mood and affect. Her behavior is normal. Thought content normal.    Assessment/Plan: 1. Functional deficits secondary to right CVA which require 3+ hours per day of interdisciplinary therapy in a comprehensive inpatient rehab setting. Physiatrist is providing close team supervision and 24 hour  management of active medical problems listed below. Physiatrist and rehab team continue to assess barriers to discharge/monitor patient progress toward functional and medical goals.  Function:  Bathing Bathing position   Position: Wheelchair/chair at sink  Bathing parts Body parts bathed by patient: Left arm, Chest, Abdomen, Front perineal area, Right arm, Buttocks, Right upper leg, Left upper leg, Right lower leg, Left lower leg Body parts bathed by helper: Back  Bathing assist Assist Level: Touching or steadying assistance(Pt > 75%)      Upper Body Dressing/Undressing Upper body dressing   What is the patient wearing?: Pull over shirt/dress, Bra Bra - Perfomed by patient: Thread/unthread right bra strap, Thread/unthread left bra strap Bra - Perfomed by helper: Hook/unhook bra (pull down sports bra) Pull over shirt/dress - Perfomed by patient: Thread/unthread right sleeve, Thread/unthread left sleeve, Put head through opening, Pull shirt over trunk Pull over shirt/dress - Perfomed by helper: Pull shirt over trunk        Upper body assist Assist Level: Touching or steadying assistance(Pt > 75%)      Lower Body Dressing/Undressing Lower body dressing   What is the patient wearing?: Underwear, Pants, Non-skid slipper socks Underwear - Performed by patient: Thread/unthread left underwear leg Underwear - Performed by helper: Thread/unthread right underwear leg, Pull underwear up/down Pants- Performed by patient: Thread/unthread left pants leg, Pull pants up/down Pants- Performed by helper: Thread/unthread right pants leg  Non-skid slipper socks- Performed by helper: Don/doff right sock, Don/doff left sock   Socks - Performed by helper: Don/doff right sock, Don/doff left sock   Shoes - Performed by helper: Don/doff right shoe, Don/doff left shoe          Lower body assist Assist for lower body dressing: Touching or steadying assistance (Pt > 75%) (MaxA  to steady when standing  to pull over hips)      Toileting Toileting Toileting activity did not occur: No continent bowel/bladder event Toileting steps completed by patient: Adjust clothing prior to toileting, Performs perineal hygiene, Adjust clothing after toileting Toileting steps completed by helper: Adjust clothing after toileting Toileting Assistive Devices: Grab bar or rail  Toileting assist Assist level:  (Tyhee to steady in standing for clothing management)   Transfers Chair/bed transfer   Chair/bed transfer method: Stand pivot Chair/bed transfer assist level: Moderate assist (Pt 50 - 74%/lift or lower) Chair/bed transfer assistive device: Armrests     Locomotion Ambulation     Max distance: 105' Assist level: Moderate assist (Pt 50 - 74%)   Wheelchair   Type: Manual Max wheelchair distance: 150 Assist Level: Supervision or verbal cues  Cognition Comprehension Comprehension assist level: Understands basic 75 - 89% of the time/ requires cueing 10 - 24% of the time  Expression Expression assist level: Expresses basic needs/ideas: With no assist  Social Interaction Social Interaction assist level: Interacts appropriately with others with medication or extra time (anti-anxiety, antidepressant).  Problem Solving Problem solving assist level: Solves basic 75 - 89% of the time/requires cueing 10 - 24% of the time  Memory Memory assist level: Recognizes or recalls 75 - 89% of the time/requires cueing 10 - 24% of the time    Medical Problem List and Plan: 1.   Left sided weakness, cognitive deficits with no safety awareness and hard to redirect secondary to right CVA on 6/27.  Continue CIR therapies 2.  DVT Prophylaxis/Anticoagulation: Pharmaceutical: Pradexa 3. Pain Management: tylenol prn 4. Mood: LCSW to follow for evaluation and support 5. Neuropsych: This patient is capable of making decisions on her own behalf. 6. Skin/Wound Care: routine pressure relief measures.  7.  Fluids/Electrolytes/Nutrition: Monitor I/O.  8. Anemia:   Hb 11.4 on 6/30  Cont to monitor 9. DM diet controlled: Hgb A1c- 6.0.  BS stable overall will check BS bid with SSI as needed. Poor intake--d/ced cardiac restrictions and continue CM diet.   Fair control as of present 10. HTN: monitor BP bid and avoid hypotension.  Continue atenolol daily.    -reasonable control at present 11. Hypokalemia  -K+ 3.8 today  -continue to replete    12. Hypoalbuminemia  Supplement initiated 6/30  LOS (Days) 7 A FACE TO FACE EVALUATION WAS PERFORMED  SWARTZ,ZACHARY T 03/16/2017 9:28 AM

## 2017-03-16 NOTE — Progress Notes (Signed)
Occupational Therapy Session Note  Patient Details  Name: Christy Sutton MRN: [id number] Date of Birth: 1926-05-26  Today's Date: 03/16/2017 OT Individual Time: 0730-0900 OT Individual Time Calculation (min): 90 min    Short Term Goals: Week 1:  OT Short Term Goal 1 (Week 1): Pt will complete 2/3 components of LB dressing in supported seated position  OT Short Term Goal 2 (Week 1): Pt will complete toilet or BSC transfer with Mod A OT Short Term Goal 3 (Week 1): Pt will complete shower transfer with Max A and LRAD OT Short Term Goal 4 (Week 1): Pt will don shirt with supervision   Skilled Therapeutic Interventions/Progress Updates:    1:1. No pain reported. Focus of session on functional transfers, sit to stand, standing balance, weight bearing on LLE, forced use of LUE, and AE for dressing. Pt stand pivot transfer throughout session with MOD A for lifting and balance while pivoting and VC for safety awareness, sequencing, weight bearing on LLE, and weight shifting. Pt able to complete all components of toileting with MOD A for standing balance. Pt washes at sit to stand level with A to wash back and A for standing balance. Pt threads BLE into pant legs and advances pants past hips with A for balance and VC for weight shifting. Pt dons button up shirt with increased time for manipulating buttons. OT demonstrates button hook use and pt able to return demonstrate with VC for technique. Pt dons socks and slip on shoes with A for donning L shoe. Pt completes walk in shower transfer with MOD A entering shower and MAX A exiting shower 2/2 decreased clearance of LLE on shower  Ledge. Exited session with pt semi reclined in bed with bed exit alarm on and all needs met.   Therapy Documentation Precautions:  Precautions Precautions: Fall Precaution Comments: L hemiparesis, impulsive, HOH Restrictions Weight Bearing Restrictions: No ADL: ADL ADL Comments: Please see functional navigator for ADL  status  See Function Navigator for Current Functional Status.   Therapy/Group: Individual Therapy  Tonny Branch 03/16/2017, 12:01 PM

## 2017-03-16 NOTE — Progress Notes (Signed)
Physical Therapy Weekly Progress Note  Patient Details  Name: Christy Sutton MRN: [id number] Date of Birth: 1926-06-29  Beginning of progress report period: March 10, 2017 End of progress report period: March 16, 2017  Today's Date: 03/16/2017 PT Individual Time: 1100-1200 and 1400-1445 PT Individual Time Calculation (min): 60 min and 45 min (total 105 min)   Patient has met 4 of 4 short term goals.  Pt currently requires minA for bed mobility and stand pivot transfers, continues to require max/totalA for gait and stairs d/t L hemiparesis, L inattention, impaired midline orientation, mild pusher syndrome. Pt additionally HOH, refuses to wear hearing aids which impairs pt's ability to receive and integrate feedback during a task. Pt with L inattention and mild impulsivity, requiring repetitive ongoing cueing for safety awareness during upright mobility tasks. Pt is motivated and hard working in all therapy sessions. Family has not yet been present for therapy to observe pt functional status or receive any education; will require hands-on training prior to d/c as pt anticipated to d/c at [facility] level overall.   Patient continues to demonstrate the following deficits muscle weakness, decreased cardiorespiratoy endurance, impaired timing and sequencing, abnormal tone, unbalanced muscle activation, decreased coordination and decreased motor planning, decreased midline orientation, decreased attention to left and decreased motor planning, decreased attention, decreased awareness, decreased problem solving, decreased safety awareness, decreased memory and delayed processing and decreased sitting balance, decreased standing balance, decreased postural control, hemiplegia and decreased balance strategies and therefore will continue to benefit from skilled PT intervention to increase functional independence with mobility.  Patient progressing toward long term goals..  Continue plan of care.  PT Short Term  Goals Week 1:  PT Short Term Goal 1 (Week 1): pt will demonstrate dynamic sitting during a functional task with min assist PT Short Term Goal 1 - Progress (Week 1): Met PT Short Term Goal 2 (Week 1): pt will ambulate 19' with LRAD PT Short Term Goal 2 - Progress (Week 1): Met PT Short Term Goal 3 (Week 1): pt will transfer with consistent min assist  PT Short Term Goal 3 - Progress (Week 1): Met PT Short Term Goal 4 (Week 1): pt will demonstrate emergent awareness with mod cues  PT Short Term Goal 4 - Progress (Week 1): Met Week 2:  PT Short Term Goal 1 (Week 2): Pt will demonstrate bed mobility with S PT Short Term Goal 2 (Week 2): Pt will demostrate stand pivot transfers consistent min guard PT Short Term Goal 3 (Week 2): pt will demonstrate gait with LRAD and modA PT Short Term Goal 4 (Week 2): Pt will demonstrate dynamic standing balance modA x5 min  Skilled Therapeutic Interventions/Progress Updates: Tx 1: Pt received supine in bed, denies pain and agreeable to treatment. Supine>sit with bedrails and S with HOB elevated; several LOB to L side upon sitting up which pt requires use of UEs and S to recover from. Transfer to w/c minA stand pivot. Assisted pt with using room telephone to call family and ask to bring tennis shoes up so therapist can trial AFO. Stand pivot transfer using grab bar to/from toilet with minA; pt pulls up/down pants with minA for standing balance with several LOBs to L side which pt recovers with UEs on grab bar. Requires several minutes seated on toilet with distant S. Pt changed pants with setupA, required assist to reset after threading LLE into incorrect leg opening and unable to correct; modA standing balance while pulling up pants and tucking in shirt.  W/c propulsion x150' with BUE for LUE NMR. Stand pivot transfer w/c <>mat table minA. Dynamic sitting balance with LUE to retrieve and place card on velcro board; encouraged pt not to use RUE to assist with regaining  balance; pt demonstrating increased reaching distance and righting reactions when slowed down in structured task compared to dynamic sitting balance while donning pants. Returned to room totalA; remained seated in w/c at end of session, quick release belt intact and all needs in reach.   Tx 2: pt received supine in bed, denies pain and agreeable to treatment. Supine>sit with logroll, HOB flat and no bedrails to simulate home environment. Stand pivot transfer bed>w/c and w/c <>mat table with minA. Standing balance with RLE elevated on 3" step with min/modA; cues for righting reactions and pt able to correct L LOB with variable min/modA. Standing balance without step with min guard overall, much improved symmetrical weight bearing and decreased L LOBs. In static standing pt performed BUE dynamic reaching to match cards to board. Standing balance/weight shifting on kinetron with focus on L weight bearing and static standing; requires max tactile cueing for postural alignment and control. Returned to room totalA. Remained seated in w/c at end of session, quick release belt intact and all needs in reach.      Therapy Documentation Precautions:  Precautions Precautions: Fall Precaution Comments: L hemiparesis, impulsive, HOH Restrictions Weight Bearing Restrictions: No   See Function Navigator for Current Functional Status.  Therapy/Group: Individual Therapy  [name] 03/16/2017, 7:16 AM

## 2017-03-17 ENCOUNTER — Inpatient Hospital Stay (HOSPITAL_COMMUNITY): Payer: Medicare Other

## 2017-03-17 ENCOUNTER — Inpatient Hospital Stay (HOSPITAL_COMMUNITY): Payer: Medicare Other | Admitting: Physical Therapy

## 2017-03-17 DIAGNOSIS — I360 Nonrheumatic tricuspid (valve) stenosis: Secondary | ICD-10-CM

## 2017-03-17 LAB — GLUCOSE, CAPILLARY
Glucose-Capillary: 175 mg/dL — ABNORMAL HIGH (ref 65–99)
Glucose-Capillary: 112 mg/dL — ABNORMAL HIGH (ref 65–99)

## 2017-03-17 LAB — ECHOCARDIOGRAM COMPLETE: Weight: 2131.2 oz

## 2017-03-17 NOTE — Plan of Care (Signed)
Problem: RH SKIN INTEGRITY Goal: RH STG SKIN FREE OF INFECTION/BREAKDOWN Skin free of infection/breakdown with min assistance  Outcome: Progressing No skin breakdown noted  Problem: RH SAFETY Goal: RH STG ADHERE TO SAFETY PRECAUTIONS W/ASSISTANCE/DEVICE STG Adhere to Safety Precautions With min Assistance/Device.  Outcome: Progressing No safety issues

## 2017-03-17 NOTE — Progress Notes (Signed)
Physical Therapy Session Note  Patient Details  Name: Christy Sutton MRN: [id number] Date of Birth: [birth year]  Today's Date: 03/17/2017 PT Individual Time: 0915-1000 PT Individual Time Calculation (min): 45 min   Short Term Goals: Week 2:  PT Short Term Goal 1 (Week 2): Pt will demonstrate bed mobility with S PT Short Term Goal 2 (Week 2): Pt will demostrate stand pivot transfers consistent min guard PT Short Term Goal 3 (Week 2): pt will demonstrate gait with LRAD and modA PT Short Term Goal 4 (Week 2): Pt will demonstrate dynamic standing balance modA x5 min  Skilled Therapeutic Interventions/Progress Updates: Pt received supine in bed, denies pain and agreeable to treatment. Supine>sit with S, bedrails and HOB flat. In sitting on EOB, pt with several LOBs posteriorly to L side, recovers without assist. Stand pivot transfer to w/c with minA, poor LLE placement and assist during transfer d/t impulsivity with activity. Upper body dressing performed with setupA, use of AE to button top two buttons of shirt. Pants donned minA for standing balance, increased time to thread LEs into pants. Grooming performed at sink with modI. W/c propulsion to gym BUE for LUE NMR. Stand pivot transfer w/c <>mat table with minA. Sitting balance while engaged in building pipe tree puzzle, min cues for problem solving. Standing balance while breaking down pipe tree; min/modA for balance with consistent LOB to L side, requires cues to reduce reliance on UEs to regain balance. Gait with LUE over therapist's shoulder and modA overall 4x25'. Much improved R weight shift and upright posture with cueing to A with L foot progression/clearance. Tactile cueing for L hip extension in stance and minimal assist to reduce LLE scissoring. Returned to room totalA; remained seated in w/c with quick release belt intact and all needs in reach.      Therapy Documentation Precautions:  Precautions Precautions: Fall Precaution Comments: L  hemiparesis, impulsive, HOH Restrictions Weight Bearing Restrictions: No   See Function Navigator for Current Functional Status.   Therapy/Group: Individual Therapy  [name] 03/17/2017, 9:52 AM

## 2017-03-17 NOTE — Progress Notes (Signed)
  Echocardiogram 2D Echocardiogram has been performed.  Christy Sutton 03/17/2017, 9:54 AM

## 2017-03-17 NOTE — Progress Notes (Signed)
[facility]ILIT[facility]     PROGRESS NOTE  Subjective/Complaints:  "i wish my left arm and leg would work better"  ROS: pt denies nausea, vomiting, diarrhea, cough, shortness of breath or chest pain   Objective: Vital Signs: Blood pressure (!) 153/66, pulse 70, temperature 97.8 F (36.6 C), temperature source Oral, resp. rate 18, weight 60.4 kg (133 lb 3.2 oz), SpO2 99 %. No results found. No results for input(s): WBC, HGB, HCT, PLT in the last 72 hours.  Recent Labs  03/16/17 0657  NA 140  K 3.8  CL 108  GLUCOSE 99  BUN 18  CREATININE 0.63  CALCIUM 8.7*   CBG (last 3)   Recent Labs  03/15/17 1639 03/16/17 1204 03/16/17 1621  GLUCAP 85 128* 102*    Wt Readings from Last 3 Encounters:  03/14/17 60.4 kg (133 lb 3.2 oz)  03/07/17 64.9 kg (143 lb)  01/18/17 67.6 kg (149 lb 0.5 oz)    Physical Exam:  BP (!) 153/66 (BP Location: Left Arm)   Pulse 70   Temp 97.8 F (36.6 C) (Oral)   Resp 18   Wt 60.4 kg (133 lb 3.2 oz)   SpO2 99%   BMI 22.86 kg/m  Constitutional: She appears well-developed and well-nourished.  HENT: Normocephalic and atraumatic.  Eyes: EOM are normal. No discharge.   Cardiovascular: RRR without murmur. No JVD    Respiratory: CTA Bilaterally without wheezes or rales. Normal effort  GI: Soft. Bowel sounds are normal.   Musculoskeletal: She exhibits no edema or tenderness.  Neurological: She is alert and oriented.  Extremely HOH.  Minimal dysarthria.  Motor: RUE/RLE: 5/5 proximal to distal LUE: 4+/5 with apraxia LLE: HF 4/5, KE 3-/5, ADF/PF 3-  Skin: Skin is warm and dry.  Psychiatric: She has a normal mood and affect. Her behavior is normal. Thought content normal.    Assessment/Plan: 1. Functional deficits secondary to right CVA which require 3+ hours per day of interdisciplinary therapy in a comprehensive inpatient rehab setting. Physiatrist is providing close team supervision and 24 hour management of active  medical problems listed below. Physiatrist and rehab team continue to assess barriers to discharge/monitor patient progress toward functional and medical goals.  Function:  Bathing Bathing position   Position: Wheelchair/chair at sink  Bathing parts Body parts bathed by patient: Left arm, Chest, Abdomen, Front perineal area, Right arm, Buttocks, Right upper leg, Left upper leg, Right lower leg, Left lower leg Body parts bathed by helper: Back  Bathing assist Assist Level: Touching or steadying assistance(Pt > 75%)      Upper Body Dressing/Undressing Upper body dressing   What is the patient wearing?: Pull over shirt/dress, Bra Bra - Perfomed by patient: Thread/unthread right bra strap, Thread/unthread left bra strap, Hook/unhook bra (pull down sports bra) Bra - Perfomed by helper: Hook/unhook bra (pull down sports bra) Pull over shirt/dress - Perfomed by patient: Thread/unthread right sleeve, Thread/unthread left sleeve, Put head through opening, Pull shirt over trunk Pull over shirt/dress - Perfomed by helper: Pull shirt over trunk        Upper body assist Assist Level: Touching or steadying assistance(Pt > 75%)      Lower Body Dressing/Undressing Lower body dressing   What is the patient wearing?: Pants, Non-skid slipper socks Underwear - Performed by patient: Thread/unthread left underwear leg Underwear - Performed by helper: Thread/unthread right underwear leg, Pull underwear up/down Pants- Performed by patient: Thread/unthread right pants leg, Thread/unthread left pants leg, Pull pants  up/down Pants- Performed by helper: Thread/unthread right pants leg   Non-skid slipper socks- Performed by helper: Don/doff right sock, Don/doff left sock   Socks - Performed by helper: Don/doff right sock, Don/doff left sock Shoes - Performed by patient: Don/doff right shoe Shoes - Performed by helper: Don/doff left shoe          Lower body assist Assist for lower body dressing:  Touching or steadying assistance (Pt > 75%) (MAX A to steady when advancing pants)      Toileting Toileting Toileting activity did not occur: No continent bowel/bladder event Toileting steps completed by patient: Adjust clothing prior to toileting, Performs perineal hygiene, Adjust clothing after toileting Toileting steps completed by helper: Adjust clothing after toileting Toileting Assistive Devices: Grab bar or rail  Toileting assist Assist level: Touching or steadying assistance (Pt.75%)   Transfers Chair/bed transfer   Chair/bed transfer method: Stand pivot Chair/bed transfer assist level: Touching or steadying assistance (Pt > 75%) Chair/bed transfer assistive device: Armrests     Locomotion Ambulation     Max distance: 48' Assist level: Moderate assist (Pt 50 - 74%)   Wheelchair   Type: Manual Max wheelchair distance: 150 Assist Level: Supervision or verbal cues  Cognition Comprehension Comprehension assist level: Understands basic 75 - 89% of the time/ requires cueing 10 - 24% of the time  Expression Expression assist level: Expresses basic needs/ideas: With no assist  Social Interaction Social Interaction assist level: Interacts appropriately with others with medication or extra time (anti-anxiety, antidepressant).  Problem Solving Problem solving assist level: Solves basic 75 - 89% of the time/requires cueing 10 - 24% of the time  Memory Memory assist level: Recognizes or recalls 75 - 89% of the time/requires cueing 10 - 24% of the time    Medical Problem List and Plan: 1.   Left sided weakness, cognitive deficits with no safety awareness and hard to redirect secondary to right CVA on 6/27.  Continue CIR therapies  -reiterated to patient that given her amount of motor function already her prognosis for further return is good 2.  DVT Prophylaxis/Anticoagulation: Pharmaceutical: Pradexa 3. Pain Management: tylenol prn 4. Mood: LCSW to follow for evaluation and  support 5. Neuropsych: This patient is capable of making decisions on her own behalf. 6. Skin/Wound Care: routine pressure relief measures.  7. Fluids/Electrolytes/Nutrition: Monitor I/O.  8. Anemia:   Hb 11.4 on 6/30  Cont to monitor 9. DM diet controlled: Hgb A1c- 6.0.  BS stable overall will check BS bid with SSI as needed. Poor intake--d/ced cardiac restrictions and continue CM diet.   Fair control as of present 10. HTN: monitor BP bid and avoid hypotension.  Continue atenolol daily.    -reasonable control at present 11. Hypokalemia  -K+ 3.8    -continue to replete    12. Hypoalbuminemia  Supplement initiated 6/30  LOS (Days) 8 A FACE TO FACE EVALUATION WAS PERFORMED  SWARTZ,ZACHARY T 03/17/2017 8:56 AM

## 2017-03-18 LAB — GLUCOSE, CAPILLARY
GLUCOSE-CAPILLARY: 90 mg/dL (ref 65–99)
Glucose-Capillary: 132 mg/dL — ABNORMAL HIGH (ref 65–99)

## 2017-03-18 NOTE — Progress Notes (Signed)
[facility]     PROGRESS NOTE  Subjective/Complaints:  No new complaints. Feels well.   ROS: pt denies nausea, vomiting, diarrhea, cough, shortness of breath or chest pain   Objective: Vital Signs: Blood pressure (!) 126/42, pulse 65, temperature 98.3 F (36.8 C), temperature source Oral, resp. rate 18, weight 60.4 kg (133 lb 3.2 oz), SpO2 97 %. No results found. No results for input(s): WBC, HGB, HCT, PLT in the last 72 hours.  Recent Labs  03/16/17 0657  NA 140  K 3.8  CL 108  GLUCOSE 99  BUN 18  CREATININE 0.63  CALCIUM 8.7*   CBG (last 3)   Recent Labs  03/16/17 1621 03/17/17 1120 03/17/17 1611  GLUCAP 102* 175* 112*    Wt Readings from Last 3 Encounters:  03/14/17 60.4 kg (133 lb 3.2 oz)  03/07/17 64.9 kg (143 lb)  01/18/17 67.6 kg (149 lb 0.5 oz)    Physical Exam:  BP (!) 126/42 (BP Location: Right Arm)   Pulse 65   Temp 98.3 F (36.8 C) (Oral)   Resp 18   Wt 60.4 kg (133 lb 3.2 oz)   SpO2 97%   BMI 22.86 kg/m  Constitutional: She appears well-developed and well-nourished.  HENT: Normocephalic and atraumatic.  Eyes: EOM are normal. No discharge.   Cardiovascular: RRR without murmur. No JVD  Respiratory: CTA Bilaterally without wheezes or rales. Normal effort  GI: Soft. Bowel sounds are normal.   Musculoskeletal: She exhibits no edema or tenderness.  Neurological: She is alert and oriented.  Extremely HOH.  Minimal dysarthria.  Motor: RUE/RLE: 5/5 proximal to distal LUE: 4+/5 with apraxia LLE: HF 4/5, KE 3-/5, ADF/PF 3-, stable  Skin: Skin is warm and dry.  Psychiatric: She has a normal mood and affect. Her behavior is normal. Thought content normal.    Assessment/Plan: 1. Functional deficits secondary to right CVA which require 3+ hours per day of interdisciplinary therapy in a comprehensive inpatient rehab setting. Physiatrist is providing close team supervision and 24 hour management of active medical  problems listed below. Physiatrist and rehab team continue to assess barriers to discharge/monitor patient progress toward functional and medical goals.  Function:  Bathing Bathing position   Position: Wheelchair/chair at sink  Bathing parts Body parts bathed by patient: Left arm, Chest, Abdomen, Front perineal area, Right arm, Buttocks, Right upper leg, Left upper leg, Right lower leg, Left lower leg Body parts bathed by helper: Back  Bathing assist Assist Level: Touching or steadying assistance(Pt > 75%)      Upper Body Dressing/Undressing Upper body dressing   What is the patient wearing?: Pull over shirt/dress, Bra Bra - Perfomed by patient: Thread/unthread right bra strap, Thread/unthread left bra strap, Hook/unhook bra (pull down sports bra) Bra - Perfomed by helper: Hook/unhook bra (pull down sports bra) Pull over shirt/dress - Perfomed by patient: Thread/unthread right sleeve, Thread/unthread left sleeve, Put head through opening, Pull shirt over trunk Pull over shirt/dress - Perfomed by helper: Pull shirt over trunk        Upper body assist Assist Level: Supervision or verbal cues, Assistive device Assistive Device Comment: button hook    Lower Body Dressing/Undressing Lower body dressing   What is the patient wearing?: Pants, Non-skid slipper socks Underwear - Performed by patient: Thread/unthread left underwear leg Underwear - Performed by helper: Thread/unthread right underwear leg, Pull underwear up/down Pants- Performed by patient: Thread/unthread left pants leg, Thread/unthread right pants leg, Pull pants up/down Pants-  Performed by helper: Thread/unthread right pants leg   Non-skid slipper socks- Performed by helper: Don/doff left sock, Don/doff right sock   Socks - Performed by helper: Don/doff right sock, Don/doff left sock Shoes - Performed by patient: Don/doff right shoe Shoes - Performed by helper: Don/doff left shoe          Lower body assist Assist  for lower body dressing: Touching or steadying assistance (Pt > 75%) (minA standing balance)      Toileting Toileting Toileting activity did not occur: No continent bowel/bladder event Toileting steps completed by patient: Adjust clothing prior to toileting, Performs perineal hygiene, Adjust clothing after toileting Toileting steps completed by helper: Adjust clothing after toileting Toileting Assistive Devices: Grab bar or rail  Toileting assist Assist level: Touching or steadying assistance (Pt.75%)   Transfers Chair/bed transfer   Chair/bed transfer method: Stand pivot Chair/bed transfer assist level: Touching or steadying assistance (Pt > 75%) Chair/bed transfer assistive device: Armrests     Locomotion Ambulation     Max distance: 25 Assist level: Moderate assist (Pt 50 - 74%)   Wheelchair   Type: Manual Max wheelchair distance: 150 Assist Level: Supervision or verbal cues  Cognition Comprehension Comprehension assist level: Understands basic 75 - 89% of the time/ requires cueing 10 - 24% of the time  Expression Expression assist level: Expresses basic needs/ideas: With no assist  Social Interaction Social Interaction assist level: Interacts appropriately with others with medication or extra time (anti-anxiety, antidepressant).  Problem Solving Problem solving assist level: Solves basic 75 - 89% of the time/requires cueing 10 - 24% of the time  Memory Memory assist level: Recognizes or recalls 75 - 89% of the time/requires cueing 10 - 24% of the time    Medical Problem List and Plan: 1.   Left sided weakness, cognitive deficits with no safety awareness and hard to redirect secondary to right CVA on 6/27.  Continue CIR therapies    2.  DVT Prophylaxis/Anticoagulation: Pharmaceutical: Pradexa 3. Pain Management: tylenol prn 4. Mood: LCSW to follow for evaluation and support 5. Neuropsych: This patient is capable of making decisions on her own behalf. 6. Skin/Wound Care:  routine pressure relief measures.  7. Fluids/Electrolytes/Nutrition: Monitor I/O.  8. Anemia:   Hb 11.4 on 6/30  Cont to monitor 9. DM diet controlled: Hgb A1c- 6.0.  BS stable overall will check BS bid with SSI as needed. Poor intake--d/ced cardiac restrictions and continue CM diet.   reasonable control as of present 10. HTN: monitor BP bid and avoid hypotension.  Continue atenolol daily.    -reasonable control at present 11. Hypokalemia  -K+ 3.8    -continue to replete    12. Hypoalbuminemia  Supplement initiated 6/30  LOS (Days) 9 A FACE TO FACE EVALUATION WAS PERFORMED  Jennessy Sandridge T 03/18/2017 8:05 AM

## 2017-03-19 ENCOUNTER — Inpatient Hospital Stay (HOSPITAL_COMMUNITY): Payer: Medicare Other | Admitting: Occupational Therapy

## 2017-03-19 ENCOUNTER — Inpatient Hospital Stay (HOSPITAL_COMMUNITY): Payer: Medicare Other | Admitting: Physical Therapy

## 2017-03-19 LAB — GLUCOSE, CAPILLARY
Glucose-Capillary: 96 mg/dL (ref 65–99)
Glucose-Capillary: 149 mg/dL — ABNORMAL HIGH (ref 65–99)
Glucose-Capillary: 98 mg/dL (ref 65–99)

## 2017-03-19 NOTE — Progress Notes (Signed)
Occupational Therapy Session Note  Patient Details  Name: Christy Sutton MRN: [id number] Date of Birth: 25-Feb-1926  Today's Date: 03/19/2017 OT Individual Time: 1415-1500 OT Individual Time Calculation (min): 45 min    Short Term Goals: Week 2:  OT Short Term Goal 1 (Week 2): Pt will be able to stand with min A or less while pulling pants over hips post toileting or during dressing. OT Short Term Goal 2 (Week 2): Pt will demonstrate improved L side awareness by positioning her L foot in safe position prior to standing.   OT Short Term Goal 3 (Week 2): Pt will be able to don socks with set up.    Skilled Therapeutic Interventions/Progress Updates:    Treatment session with focus on functional mobility and functional use of LUE.  Pt received asleep in bed, awakened to stimulus.  Completed bed mobility with supervision and donned shoes seated EOB with min assist for sitting balance.  Stand pivot transfer min assist with mod cues for sequencing and safety due to impulsivity.  Engaged in sidestepping outside parallel bars x4 with focus on clearing LLE as required for stepping in to walk-in shower.  Also completed forward and backward stepping inside parallel bars over 4" ledge to simulate forward/backward entrance in/out of shower.  Max cues for sequencing of stepping pattern to increase safety and independence.  Bimanual task in sitting with focus on folding towels and opening pill bottles.  Pt required min cues to increase use of LUE during tasks.  Pt oriented to time with increased time.  Returned to room and to bed as above.  Therapy Documentation Precautions:  Precautions Precautions: Fall Precaution Comments: L hemiparesis, impulsive, HOH Restrictions Weight Bearing Restrictions: No General:   Vital Signs: Therapy Vitals Temp: 98.4 F (36.9 C) Temp Source: Oral Pulse Rate: 63 Resp: 19 BP: (!) 110/53 Patient Position (if appropriate): Sitting Oxygen Therapy SpO2: 99 % O2 Device: Not  Delivered Pain:  Pt with no c/o pain  See Function Navigator for Current Functional Status.   Therapy/Group: Individual Therapy  Simonne Come 03/19/2017, 3:12 PM

## 2017-03-19 NOTE — Progress Notes (Signed)
Occupational Therapy Weekly Progress Note  Patient Details  Name: Christy Sutton MRN: [id number] Date of Birth: March 08, 1926  Beginning of progress report period: March 10, 2017 End of progress report period: March 19, 2017  Today's Date: 03/19/2017 OT Individual Time: 4315-4008 OT Individual Time Calculation (min): 70 min    Patient has met 4 of 4 short term goals.  Pt has been making excellent progress this week. She is now able to actively use her LUE as an active assist during ADLs and is even able to fasten her own bra.  Pt has also progressed with sitting balance so she can now perform UB self care at a set up level and LB self care with min A from seated.  When she moves into stand she requires min-mod A as she has a L lean with decreased L side awareness. Her stand pivot transfers are also improving to a min A level.  Patient continues to demonstrate the following deficits: unbalanced muscle activation and decreased coordination, decreased attention to left, decreased awareness, decreased problem solving, decreased safety awareness and decreased memory and decreased standing balance, decreased postural control and hemiplegia and therefore will continue to benefit from skilled OT intervention to enhance overall performance with BADL.  Patient progressing toward long term goals..  Continue plan of care.  OT Short Term Goals Week 1:  OT Short Term Goal 1 (Week 1): Pt will complete 2/3 components of LB dressing in supported seated position  OT Short Term Goal 1 - Progress (Week 1): Met OT Short Term Goal 2 (Week 1): Pt will complete toilet or BSC transfer with Mod A OT Short Term Goal 2 - Progress (Week 1): Met OT Short Term Goal 3 (Week 1): Pt will complete shower transfer with Max A and LRAD OT Short Term Goal 3 - Progress (Week 1): Met OT Short Term Goal 4 (Week 1): Pt will don shirt with supervision  OT Short Term Goal 4 - Progress (Week 1): Met Week 2:  OT Short Term Goal 1 (Week 2): Pt  will be able to stand with min A or less while pulling pants over hips post toileting or during dressing. OT Short Term Goal 2 (Week 2): Pt will demonstrate improved L side awareness by positioning her L foot in safe position prior to standing.   OT Short Term Goal 3 (Week 2): Pt will be able to don socks with set up.    Skilled Therapeutic Interventions/Progress Updates:    Pt seen for ADL retraining with a focus on postural control and balance.  Pt received finishing breakfast, she then brushed teeth with set up and picked out clothing from drawers from wc level.  She adamantly declined shower but was agreeable to practicing shower stall transfers. Pt can use grab bars to pivot into shower stall with min A.  She will need to work on stepping over a 2 inch ledge to simulate her home environment.  Min A stand pivot to toilet to R. Pt needed extra time to toilet as she was feeling constipated but wanted to sit to try to complete.    Pt completed light wash up (pt's choice) and donned UB clothing sitting on toilet and then transferred to w/c to dress LB with min A to support LLE as she adjusted clothing over her foot.  Her standing balance during clothing management is improving, but she continues to need cues for foot placement and to focus on her balance as she tries to pull pants  over hips.   Pt had a long wait for next PT session, she requested to get back in bed. Pt transferred to bed and bed alarm set. Pt with all needs met.   Therapy Documentation Precautions:  Precautions Precautions: Fall Precaution Comments: L hemiparesis, impulsive, HOH Restrictions Weight Bearing Restrictions: No    Vital Signs: Therapy Vitals Temp: 98.6 F (37 C) Temp Source: Oral Pulse Rate: (!) 58 Resp: 20 BP: (!) 142/53 Patient Position (if appropriate): Lying Oxygen Therapy SpO2: 99 % O2 Device: Not Delivered Pain: Pain Assessment Pain Assessment: No/denies pain Pain Score: 0-No  pain ADL: ADL ADL Comments: Please see functional navigator for ADL status  See Function Navigator for Current Functional Status.   Therapy/Group: Individual Therapy  [place] 03/19/2017, 8:02 AM

## 2017-03-19 NOTE — Progress Notes (Signed)
Physical Therapy Session Note  Patient Details  Name: Christy Sutton MRN: [id number] Date of Birth: 1926/08/02  Today's Date: 03/19/2017 PT Individual Time: 0920-1000 1100-1200  PT Individual Time Calculation (min): 40 min and 60 min (142min total)  Short Term Goals: Week 2:  PT Short Term Goal 1 (Week 2): Pt will demonstrate bed mobility with S PT Short Term Goal 2 (Week 2): Pt will demostrate stand pivot transfers consistent min guard PT Short Term Goal 3 (Week 2): pt will demonstrate gait with LRAD and modA PT Short Term Goal 4 (Week 2): Pt will demonstrate dynamic standing balance modA x5 min  Skilled Therapeutic Interventions/Progress Updates:    Tx 1: no c/o of pain. PT treatment session focused on sitting and standing balance during functional activities and static standing balance during cognitive task using L UE. Pt supine in bed upon arrival and agreeable to PT treatment session. Pt performs supine to sit with min assist using bedrails and HOB elevated. Pt performs stand pivot transfer from bed to w/c with min assist for steadying. Pt performs lower body dressing at the sink with min assist to place LEs into pants while sitting and working on dynamic sitting balance. Pt stands to finish lower body dressing and requires min assist preventing posterior LOB and pt using R UE support on the sink with verbal cues throughout for postural control. Pt dons socks and shoes with mod assist for LE placement for the task and PT encouragement to use L UE throughout. PT pushed w/c to/from therapy gym to save time. Pt uses L UE to place clothes pin on a line during static standing with min guard assist and tactile cues for L knee extension to work on [employer] with static standing balance. Pt performed stand pivot transfer from bed to w/c with min assist for steadying. Pt left supine in bed with call bell in reach and bed alarm on.   Tx 2: no c/o of pain. PT treatment session focused on L LE NMR during gait and  functional activities.  Pt supine in bed upon arrival and agreeable to PT treatment session. Pt performs supine to sit with supervision for safety using bed rails and HOB elevated. Pt performs stand pivot transfers without an AD from bed<>w/c<>mat with min assist for steadying throughout session. PT rolled pt in w/c to/from therapy gym to save time. Pt sitting edge of mat playing horsehoes using L UE working on dynamic sitting balance and L UE coordination and grasp. Pt ambulates 86ft with RW, with L hand splint, with min assist. PT provided tactile cues at L hip and knee for upright posture and for hip and knee extension during stance phase of gait. PT placed a foot-up AFO on the pt's L LE to assist with dorsiflexion during step ups and gait. Pt performed step ups with the L LE with min assist and tactile cues for upright trunk posture and hip and knee extension during the step up and tactile/manual cues for knee and hip flexion during step down. Pt ambulates with RW, without L hand splint, with min assist and tactile cues for posterior pelvic tilt and hip extension of L LE during stance and L LE hip flexion during swing with noticed decreased forward trunk lean. Pt able to maintain L hand grip so PT removed hand splint from RW. Pt returned to room left in w/c with QRB on and call bell in reach.   Therapy Documentation Precautions:  Precautions Precautions: Fall Precaution Comments: L hemiparesis,  impulsive, HOH Restrictions Weight Bearing Restrictions: No   See Function Navigator for Current Functional Status.   Therapy/Group: Individual Therapy  Christy Sutton 03/19/2017, 12:03 PM

## 2017-03-19 NOTE — Progress Notes (Signed)
Christy Sutton PHYSICAL MEDICINE & REHABILITATION     PROGRESS NOTE  Subjective/Complaints:  Up in chair. Just finished her breakfast. No complaints. In wb in w/c  ROS: pt denies nausea, vomiting, diarrhea, cough, shortness of breath or chest pain    Objective: Vital Signs: Blood pressure (!) 142/53, pulse (!) 58, temperature 98.6 F (37 C), temperature source Oral, resp. rate 20, weight 60.4 kg (133 lb 3.2 oz), SpO2 99 %. No results found. No results for input(s): WBC, HGB, HCT, PLT in the last 72 hours. No results for input(s): NA, K, CL, GLUCOSE, BUN, CREATININE, CALCIUM in the last 72 hours.  Invalid input(s): CO CBG (last 3)   Recent Labs  03/17/17 1611 03/18/17 1113 03/18/17 1650  GLUCAP 112* 132* 90    Wt Readings from Last 3 Encounters:  03/14/17 60.4 kg (133 lb 3.2 oz)  03/07/17 64.9 kg (143 lb)  01/18/17 67.6 kg (149 lb 0.5 oz)    Physical Exam:  BP (!) 142/53 (BP Location: Left Arm)   Pulse (!) 58   Temp 98.6 F (37 C) (Oral)   Resp 20   Wt 60.4 kg (133 lb 3.2 oz)   SpO2 99%   BMI 22.86 kg/m  Constitutional: She appears well-developed and well-nourished.  HENT: Normocephalic and atraumatic.  Eyes: EOM are normal. No discharge.   Cardiovascular: RRR without murmur. No JVD  Respiratory: CTA Bilaterally without wheezes or rales. Normal effort  GI: Soft. Bowel sounds are normal.   Musculoskeletal: She exhibits no edema or tenderness.  Neurological: She is alert and oriented.  Extremely HOH.  Minimal dysarthria.  Motor: RUE/RLE: 5/5 proximal to distal LUE: 4+/5 with apraxia LLE: HF 4/5, KE 3-/5, ADF/PF 3-, stable  Skin: Skin is warm and dry.  Psychiatric: She has a normal mood and affect. Her behavior is normal. Thought content normal.    Assessment/Plan: 1. Functional deficits secondary to right CVA which require 3+ hours per day of interdisciplinary therapy in a comprehensive inpatient rehab setting. Physiatrist is providing close team supervision and  24 hour management of active medical problems listed below. Physiatrist and rehab team continue to assess barriers to discharge/monitor patient progress toward functional and medical goals.  Function:  Bathing Bathing position   Position: Wheelchair/chair at sink  Bathing parts Body parts bathed by patient: Left arm, Chest, Abdomen, Front perineal area, Right arm, Buttocks, Right upper leg, Left upper leg, Right lower leg, Left lower leg Body parts bathed by helper: Back  Bathing assist Assist Level: Touching or steadying assistance(Pt > 75%)      Upper Body Dressing/Undressing Upper body dressing   What is the patient wearing?: Pull over shirt/dress, Bra Bra - Perfomed by patient: Thread/unthread right bra strap, Thread/unthread left bra strap, Hook/unhook bra (pull down sports bra) Bra - Perfomed by helper: Hook/unhook bra (pull down sports bra) Pull over shirt/dress - Perfomed by patient: Thread/unthread right sleeve, Thread/unthread left sleeve, Put head through opening, Pull shirt over trunk Pull over shirt/dress - Perfomed by helper: Pull shirt over trunk        Upper body assist Assist Level: Supervision or verbal cues, Assistive device Assistive Device Comment: button hook    Lower Body Dressing/Undressing Lower body dressing   What is the patient wearing?: Pants, Non-skid slipper socks Underwear - Performed by patient: Thread/unthread left underwear leg Underwear - Performed by helper: Thread/unthread right underwear leg, Pull underwear up/down Pants- Performed by patient: Thread/unthread left pants leg, Thread/unthread right pants leg, Pull pants up/down  Pants- Performed by helper: Thread/unthread right pants leg   Non-skid slipper socks- Performed by helper: Don/doff left sock, Don/doff right sock   Socks - Performed by helper: Don/doff right sock, Don/doff left sock Shoes - Performed by patient: Don/doff right shoe Shoes - Performed by helper: Don/doff left shoe           Lower body assist Assist for lower body dressing: Touching or steadying assistance (Pt > 75%) (minA standing balance)      Toileting Toileting Toileting activity did not occur: No continent bowel/bladder event Toileting steps completed by patient: Adjust clothing prior to toileting, Performs perineal hygiene, Adjust clothing after toileting Toileting steps completed by helper: Adjust clothing after toileting Toileting Assistive Devices: Grab bar or rail  Toileting assist Assist level: Touching or steadying assistance (Pt.75%)   Transfers Chair/bed transfer   Chair/bed transfer method: Stand pivot Chair/bed transfer assist level: Touching or steadying assistance (Pt > 75%) Chair/bed transfer assistive device: Armrests     Locomotion Ambulation     Max distance: 25 Assist level: Moderate assist (Pt 50 - 74%)   Wheelchair   Type: Manual Max wheelchair distance: 150 Assist Level: Supervision or verbal cues  Cognition Comprehension Comprehension assist level: Understands basic 75 - 89% of the time/ requires cueing 10 - 24% of the time  Expression Expression assist level: Expresses basic needs/ideas: With no assist  Social Interaction Social Interaction assist level: Interacts appropriately with others with medication or extra time (anti-anxiety, antidepressant).  Problem Solving Problem solving assist level: Solves basic 75 - 89% of the time/requires cueing 10 - 24% of the time  Memory Memory assist level: Recognizes or recalls 75 - 89% of the time/requires cueing 10 - 24% of the time    Medical Problem List and Plan: 1.   Left sided weakness, cognitive deficits with no safety awareness and hard to redirect secondary to right CVA on 6/27.  Continue CIR therapies    2.  DVT Prophylaxis/Anticoagulation: Pharmaceutical: Pradexa 3. Pain Management: tylenol prn 4. Mood: LCSW to follow for evaluation and support 5. Neuropsych: This patient is capable of making decisions on her  own behalf. 6. Skin/Wound Care: routine pressure relief measures.  7. Fluids/Electrolytes/Nutrition: Monitor I/O.  8. Anemia:   Hb 11.4 on 6/30  Cont to monitor 9. DM diet controlled: Hgb A1c- 6.0.  BS stable overall will check BS bid with SSI as needed. Poor intake--d/ced cardiac restrictions and continue CM diet.   reasonable control as of present 10. HTN: monitor BP bid and avoid hypotension.  Continue atenolol daily.    -controlled at present 11. Hypokalemia  -K+ 3.8    -continue to replete    12. Hypoalbuminemia  Supplement initiated 6/30  LOS (Days) 10 A FACE TO FACE EVALUATION WAS PERFORMED  Kanchan Gal T 03/19/2017 8:28 AM

## 2017-03-20 ENCOUNTER — Inpatient Hospital Stay (HOSPITAL_COMMUNITY): Payer: Medicare Other

## 2017-03-20 ENCOUNTER — Inpatient Hospital Stay (HOSPITAL_COMMUNITY): Payer: Medicare Other | Admitting: Physical Therapy

## 2017-03-20 ENCOUNTER — Inpatient Hospital Stay (HOSPITAL_COMMUNITY): Payer: Medicare Other | Admitting: Occupational Therapy

## 2017-03-20 ENCOUNTER — Encounter (HOSPITAL_COMMUNITY): Payer: Self-pay

## 2017-03-20 LAB — GLUCOSE, CAPILLARY
GLUCOSE-CAPILLARY: 121 mg/dL — AB (ref 65–99)
Glucose-Capillary: 170 mg/dL — ABNORMAL HIGH (ref 65–99)
Glucose-Capillary: 63 mg/dL — ABNORMAL LOW (ref 65–99)
Glucose-Capillary: 138 mg/dL — ABNORMAL HIGH (ref 65–99)

## 2017-03-20 NOTE — Progress Notes (Signed)
Physical Therapy Note  Patient Details  Name: Christy Sutton MRN: [id number] Date of Birth: 10-11-1925 Today's Date: 03/20/2017    Time: 1275-1700 54 minutes  1:1 No c/o pain.  Pt in bed when PT arrived, grandson and his family present for session.  Pt agreeable to treatment.  Supine to sit with increased time, total A for use of bed controls on new bed.  Stand pivot transfers with mod A with cuing for UE placement and Lt LE placement.  Gait with RW with ace wrap to prevent foot drag on Lt LE.  Mod A for gait with pt able to self correct narrow BOS and widen.  Decreased control and coordination of Lt LE during gait, requiring verbal and tactile cues for control.  Gait with obstacles and turns with mod A for balance and max verbal and tactile cues for safety and control.  seated balance with ball toss game with pt able to self correct LOB seated on mat without LE or UE support.  Standing ball kick with max A for balance, cues at hips and knee for Lt LE stance control and for wt shift for kicking.  Pt fatigued at end of session but able to perform session with limited rest breaks.  Pt's family pleased with progress.   Aneya Daddona 03/20/2017, 5:08 PM

## 2017-03-20 NOTE — Progress Notes (Signed)
Physical Therapy Session Note  Patient Details  Name: Christy Sutton MRN: [id number] Date of Birth: 14-Sep-1925  Today's Date: 03/20/2017 PT Individual Time: 0601-5615 PT Individual Time Calculation (min): 60 min   Short Term Goals: Week 2:  PT Short Term Goal 1 (Week 2): Pt will demonstrate bed mobility with S PT Short Term Goal 2 (Week 2): Pt will demostrate stand pivot transfers consistent min guard PT Short Term Goal 3 (Week 2): pt will demonstrate gait with LRAD and modA PT Short Term Goal 4 (Week 2): Pt will demonstrate dynamic standing balance modA x5 min  Skilled Therapeutic Interventions/Progress Updates:    Pt presents in bed with request to use bathroom. Performed functional bed mobility to get to EOB with supervision and min assist stand pivot transfer to w/c with cues for hand placement and safety due to impulsivity. Transferred to toilet in same manner with min assist for standing balance for clothing management. Engaged in functional dressing activity with set-up for obtaining clothing with pt able to don shirt and pants with extra time including buttons on shirt (without AD for bimanual task to address LUE fine motor control) and min assist for standing balance and postural control in standing while pulling up pants and tucking in shirt - facilitation for upright posture, weightbearing through LLE, and maintaining balance. Performed hygiene at sink prior to leaving room mod I. SHort distance gait with RW with facilitation for knee control and coordination on LLE with min assist for balance x 20' x 2. Nustep to address reciprocal movement pattern re-training and functional strengthening and coordination of LUE/LLE x 8 min on level 5 progressing to just using LUE/LLE and RLE to increase forced use of L.   Therapy Documentation Precautions:  Precautions Precautions: Fall Precaution Comments: L hemiparesis, impulsive, HOH Restrictions Weight Bearing Restrictions: No  Pain: No  complaints   See Function Navigator for Current Functional Status.   Therapy/Group: Individual Therapy  [name], PT, DPT  03/20/2017, 9:45 AM

## 2017-03-20 NOTE — Progress Notes (Signed)
Occupational Therapy Session Note  Patient Details  Name: Christy Sutton MRN: [id number] Date of Birth: 09/15/1925  Today's Date: 03/20/2017 OT Individual Time: 1000-1115 OT Individual Time Calculation (min): 75 min    Short Term Goals: Week 2:  OT Short Term Goal 1 (Week 2): Pt will be able to stand with min A or less while pulling pants over hips post toileting or during dressing. OT Short Term Goal 2 (Week 2): Pt will demonstrate improved L side awareness by positioning her L foot in safe position prior to standing.   OT Short Term Goal 3 (Week 2): Pt will be able to don socks with set up.    Skilled Therapeutic Interventions/Progress Updates:    Pt seen this session for NMR of postural control, LUE coordination, sit and standing balance.  Pt received in wc already dressed and ready for the day.  Pt agreeable to treatment in the gym.  Pt transported to gym and transferred to mat. Worked on mat for 30 min and then transferred to arm chair as her back was bothering her.  Pt worked on: -PNF bimanual AROM for slow controlled movements -Dynamic reaching laterally with weight shift in sitting -Overhead arm extension lifting large physioball -FMC and visual tracking completing 2 peg board design (only      1 cue needed) -sitting on triangular wedge for forced use of LLE -sit to stand from wedge, flat mat, and arm chair -static stand with no UE support (pt achieved CLOSE S) -standing wt shifts with LUE support on chair  Pt tolerated therapy well with very short rest breaks.    Transferred back to wc with min A. Pt taken back to room with all needs met. Quick release belt on.    Therapy Documentation Precautions:  Precautions Precautions: Fall Precaution Comments: L hemiparesis, impulsive, HOH Restrictions Weight Bearing Restrictions: No      Pain: Pain Assessment Pain Assessment: No/denies pain Pain Score: 0-No pain ADL: ADL ADL Comments: Please see functional navigator for ADL  status  See Function Navigator for Current Functional Status.   Therapy/Group: Individual Therapy  [place] 03/20/2017, 12:01 PM

## 2017-03-20 NOTE — Progress Notes (Signed)
Hypoglycemic Event  CBG: 63  Treatment: 15 GM carbohydrate snack  Symptoms: None  Follow-up CBG: Time:1745 CBG Result:121  Possible Reasons for Event: Unknown  Comments/MD notified:Pam Love, PA notified- ordered to check blood sugar before bed tonight.    Brita Romp

## 2017-03-20 NOTE — Progress Notes (Signed)
[facility] PHYSICAL MEDICINE & REHABILITATION     PROGRESS NOTE  Subjective/Complaints:  No new problems today. Up with therapy already.   ROS: pt denies nausea, vomiting, diarrhea, cough, shortness of breath or chest pain    Objective: Vital Signs: Blood pressure 123/62, pulse 70, temperature 97.7 F (36.5 C), temperature source Oral, resp. rate 18, weight 60.4 kg (133 lb 3.2 oz), SpO2 100 %. No results found. No results for input(s): WBC, HGB, HCT, PLT in the last 72 hours. No results for input(s): NA, K, CL, GLUCOSE, BUN, CREATININE, CALCIUM in the last 72 hours.  Invalid input(s): CO CBG (last 3)   Recent Labs  03/19/17 0621 03/19/17 1220 03/19/17 1642  GLUCAP 96 149* 98    Wt Readings from Last 3 Encounters:  03/14/17 60.4 kg (133 lb 3.2 oz)  03/07/17 64.9 kg (143 lb)  01/18/17 67.6 kg (149 lb 0.5 oz)    Physical Exam:  BP 123/62 (BP Location: Left Arm)   Pulse 70   Temp 97.7 F (36.5 C) (Oral)   Resp 18   Wt 60.4 kg (133 lb 3.2 oz)   SpO2 100%   BMI 22.86 kg/m  Constitutional: She appears well-developed and well-nourished.  HENT: Normocephalic and atraumatic.  Eyes: EOM are normal. No discharge.   Cardiovascular: RRR without murmur. No JVD  Respiratory: CTA Bilaterally without wheezes or rales. Normal effort  GI: Soft. Bowel sounds are normal.   Musculoskeletal: She exhibits no edema or tenderness.  Neurological: She is alert and oriented.  Extremely HOH.  Minimal dysarthria.  Motor: RUE/RLE: 5/5 proximal to distal LUE: 4+/5 with apraxia LLE: HF 4/5, KE 3-/5, ADF/PF 3-, stable  Skin: Skin is warm and dry.  Psychiatric: She has a normal mood and affect. Her behavior is normal. Thought content normal.    Assessment/Plan: 1. Functional deficits secondary to right CVA which require 3+ hours per day of interdisciplinary therapy in a comprehensive inpatient rehab setting. Physiatrist is providing close team supervision and 24 hour management of active  medical problems listed below. Physiatrist and rehab team continue to assess barriers to discharge/monitor patient progress toward functional and medical goals.  Function:  Bathing Bathing position   Position: Wheelchair/chair at sink  Bathing parts Body parts bathed by patient: Left arm, Chest, Abdomen, Front perineal area, Right arm, Buttocks, Right upper leg, Left upper leg, Right lower leg, Left lower leg Body parts bathed by helper: Back  Bathing assist Assist Level: Touching or steadying assistance(Pt > 75%)      Upper Body Dressing/Undressing Upper body dressing   What is the patient wearing?: Pull over shirt/dress, Bra Bra - Perfomed by patient: Thread/unthread right bra strap, Thread/unthread left bra strap, Hook/unhook bra (pull down sports bra) Bra - Perfomed by helper: Hook/unhook bra (pull down sports bra) Pull over shirt/dress - Perfomed by patient: Thread/unthread right sleeve, Thread/unthread left sleeve, Put head through opening, Pull shirt over trunk Pull over shirt/dress - Perfomed by helper: Pull shirt over trunk        Upper body assist Assist Level: Set up Assistive Device Comment: button hook Set up : To obtain clothing/put away  Lower Body Dressing/Undressing Lower body dressing   What is the patient wearing?: Pants, Non-skid slipper socks Underwear - Performed by patient: Thread/unthread left underwear leg, Thread/unthread right underwear leg, Pull underwear up/down Underwear - Performed by helper: Thread/unthread right underwear leg, Pull underwear up/down Pants- Performed by patient: Thread/unthread left pants leg, Thread/unthread right pants leg, Pull pants up/down  Pants- Performed by helper: Thread/unthread right pants leg Non-skid slipper socks- Performed by patient: Don/doff right sock, Don/doff left sock Non-skid slipper socks- Performed by helper: Don/doff left sock, Don/doff right sock   Socks - Performed by helper: Don/doff right sock, Don/doff  left sock Shoes - Performed by patient: Don/doff right shoe Shoes - Performed by helper: Don/doff left shoe          Lower body assist Assist for lower body dressing: Touching or steadying assistance (Pt > 75%) (standing to pull up pants)      Toileting Toileting Toileting activity did not occur: No continent bowel/bladder event Toileting steps completed by patient: Adjust clothing prior to toileting, Performs perineal hygiene, Adjust clothing after toileting Toileting steps completed by helper: Adjust clothing after toileting Toileting Assistive Devices: Grab bar or rail  Toileting assist Assist level: Touching or steadying assistance (Pt.75%)   Transfers Chair/bed transfer   Chair/bed transfer method: Stand pivot Chair/bed transfer assist level: Touching or steadying assistance (Pt > 75%) Chair/bed transfer assistive device: Armrests     Locomotion Ambulation     Max distance: 25 Assist level: Moderate assist (Pt 50 - 74%)   Wheelchair   Type: Manual Max wheelchair distance: 150 Assist Level: Supervision or verbal cues  Cognition Comprehension Comprehension assist level: Understands basic 75 - 89% of the time/ requires cueing 10 - 24% of the time  Expression Expression assist level: Expresses basic needs/ideas: With no assist  Social Interaction Social Interaction assist level: Interacts appropriately with others with medication or extra time (anti-anxiety, antidepressant).  Problem Solving Problem solving assist level: Solves basic 75 - 89% of the time/requires cueing 10 - 24% of the time  Memory Memory assist level: Recognizes or recalls 75 - 89% of the time/requires cueing 10 - 24% of the time    Medical Problem List and Plan: 1.   Left sided weakness, cognitive deficits with no safety awareness and hard to redirect secondary to right CVA on 6/27.  Continue CIR therapies---team conference today    2.  DVT Prophylaxis/Anticoagulation: Pharmaceutical: Pradexa 3. Pain  Management: tylenol prn 4. Mood: LCSW to follow for evaluation and support 5. Neuropsych: This patient is capable of making decisions on her own behalf. 6. Skin/Wound Care: routine pressure relief measures.  7. Fluids/Electrolytes/Nutrition: Monitor I/O.  8. Anemia:   Hb 11.4 on 6/30  Cont to monitor 9. DM diet controlled: Hgb A1c- 6.0.  BS stable overall will check BS bid with SSI as needed. Poor intake--d/ced cardiac restrictions and continue CM diet.   reasonable control as of present 10. HTN: monitor BP bid and avoid hypotension.  Continue atenolol daily.    -controlled at present 11. Hypokalemia  -K+ 3.8    -follow up this week.     12. Hypoalbuminemia  Supplement initiated 6/30  LOS (Days) 11 A FACE TO FACE EVALUATION WAS PERFORMED  SWARTZ,ZACHARY T 03/20/2017 9:18 AM

## 2017-03-21 ENCOUNTER — Inpatient Hospital Stay (HOSPITAL_COMMUNITY): Payer: Medicare Other | Admitting: Occupational Therapy

## 2017-03-21 ENCOUNTER — Inpatient Hospital Stay (HOSPITAL_COMMUNITY): Payer: Medicare Other | Admitting: Physical Therapy

## 2017-03-21 DIAGNOSIS — I6340 Cerebral infarction due to embolism of unspecified cerebral artery: Secondary | ICD-10-CM

## 2017-03-21 LAB — GLUCOSE, CAPILLARY
GLUCOSE-CAPILLARY: 174 mg/dL — AB (ref 65–99)
GLUCOSE-CAPILLARY: 96 mg/dL (ref 65–99)

## 2017-03-21 NOTE — Progress Notes (Signed)
Social Work Patient ID: Christy Sutton, female   DOB: 20-Mar-1926, 83 y.o.   MRN: [id number]   Christy Sutton, Christy Legacy, LCSW Social Worker Signed   Patient Care Conference Date of Service: 03/21/2017  2:35 PM      Hide copied text [place]over for attribution information Inpatient RehabilitationTeam Conference and Plan of Care Update Date: 03/20/2017   Time: 2:00 PM      Patient Name: Christy Sutton      Medical Record Number: [id number]  Date of Birth: [birth year] Sex: Female         Room/Bed: 4W05C/4W05C-01 Payor Info: Payor: MEDICARE / Plan: MEDICARE PART A AND B / Product Type: *No Product type* /     Admitting Diagnosis: CVA  Admit Date/Time:  03/09/2017  4:24 PM Admission Comments: No comment available    Primary Diagnosis:  <principal problem not specified> Principal Problem: <principal problem not specified>       Patient Active Problem List    Diagnosis Date Noted  . Left hemiparesis ([place])    . Acute blood loss anemia    . Benign essential HTN    . Hypokalemia    . Hypoalbuminemia due to protein-calorie malnutrition ([place])    . Stroke due to embolism ([place]) 03/09/2017  . Diabetes mellitus type 2 in nonobese ([facility])    . History of CVA (cerebrovascular accident)    . Cerebral embolism with cerebral infarction 03/07/2017  . Stroke ([place]) 01/17/2017  . TIA (transient ischemic attack) 01/17/2017  . Dysmetria 01/17/2017  . Essential hypertension 01/17/2017  . Diabetes mellitus with complication ([place]) 93/90/3009  . Permanent atrial fibrillation ([place]) 01/17/2017  . Ischemic stroke [facility])        Expected Discharge Date: Expected Discharge Date: 03/31/17   Team Members Present: Physician leading conference: Dr. Alger Simons Social Worker Present: Lennart Pall, LCSW Nurse Present: Rayetta Pigg, RN PT Present: Kem Parkinson, PT OT Present: Roanna Epley, [place], OT SLP Present: Weston Anna, SLP PPS Coordinator present : Daiva Nakayama, RN, CRRN       Current Status/Progress  Goal Weekly Team Focus  Medical     heart rate controlled, left hemiparesis improving.   improve functional mobility, safety  nutrition, bp/hr control   Bowel/Bladder     Continent of bowel and bladder; LBM 7/9- has some difficulty starting stream.  Mod I assist  Assess and treat for constipation as needed   Swallow/Nutrition/ Hydration               ADL's     set up UB self care, min A LB self care, min-mod stand pivot transfers, mod A dynamic standing due to L lean and decreased L side awareness, pt actively used LUE in self care  min A overall  cognitve awareness for safety, postural control and awareness, ADL training, standing balance, pt/family education   Mobility     S bed mobility, min/modA transfers and gait with RW, continues to be limited by impulsivity, poor awareness of deficits  S bed mobility and transfers, minA gait in home and stairs for strengthening  L attention, L NMR, dynamic sitting/standing balance, transfer and gait training   Communication               Safety/Cognition/ Behavioral Observations             Pain     Denies pain  < 3  Assess and treat for pain q shift and prn   Skin     Blanchable redness  to sacrum  Mod I assist  Assess skin q shift and prn     Rehab Goals Patient on target to meet rehab goals: Yes *See Care Plan and progress notes for long and short-term goals.      Barriers to Discharge   Current Status/Progress Possible Resolutions Date Resolved   Physician               continued medical mgt, adaptive techniques        Nursing                   PT                     OT                  SLP              SW              Discharge Planning/Teaching Needs:  Pt will return home with her family and they will arrange 24/7 care.  Family will come in to go through family education prior to d/c.   Team Discussion:  Pt doing well medically.  She is ambulating with RW with mod A and transfers at min A, still with cues for safety.   Is making steady progress with ADLs.  Pt is on target for 03-31-17 d/c.  Revisions to Treatment Plan:  none    Continued Need for Acute Rehabilitation Level of Care: The patient requires daily medical management by a physician with specialized training in physical medicine and rehabilitation for the following conditions: Daily direction of a multidisciplinary physical rehabilitation program to ensure safe treatment while eliciting the highest outcome that is of practical value to the patient.: Yes Daily medical management of patient stability for increased activity during participation in an intensive rehabilitation regime.: Yes Daily analysis of laboratory values and/or radiology reports with any subsequent need for medication adjustment of medical intervention for : Neurological problems;Blood pressure problems   Christy Sutton, Christy Sutton 03/21/2017, 2:35 PM

## 2017-03-21 NOTE — Progress Notes (Addendum)
Occupational Therapy Session Note  Patient Details  Name: Christy Sutton MRN: [id number] Date of Birth: 1926/01/25  Today's Date: 03/21/2017 OT Individual Time:1131-1200 and 1300-1400 OT Individual Time Calculation (min): 29 min and 60 min    Short Term Goals: Week 2:  OT Short Term Goal 1 (Week 2): Pt will be able to stand with min A or less while pulling pants over hips post toileting or during dressing. OT Short Term Goal 2 (Week 2): Pt will demonstrate improved L side awareness by positioning her L foot in safe position prior to standing.   OT Short Term Goal 3 (Week 2): Pt will be able to don socks with set up.    Skilled Therapeutic Interventions/Progress Updates:    Session 1: Pt presented sitting up in w/c ready for OT tx session. Transported Pt totalA via w/c to BI gym where Pt completed dynamic standing peg board activity using L UE to place pegs. Therapist facilitating extension of L LE during standing using multimodal cues and providing min-modA for steady assist during dynamic activity. Mod verbal cues for UE/LE placement during sit<>stand transfers with Pt completing with MinA. Pt transported back to room in same manner described above. Pt demonstrates opening/closing twist off caps from container while seated using L UE and min verbal cues for using R UE as gross stabilizer to complete. Pt was left seated in w/c, QRB donned, call bell and needs within reach.   Session 2 with focus on further dynamic standing balance, functional mobility transfers, increased L UE FMC. Pt total assist via w/c to rehab gym where Pt completed stand pivot transfer to mat table with modA. Pt participated in dynamic standing activity of connect 4 and card matching ativity. Pt reaching outside BOS to L and R throughout activity to obtain and place items with minGuard-ModA for steady assist throughout and multimodal cues for facilitating straightening LLE while standing as Pt tends to let L knee buckle. Min  verbal cues for using RUE for standing balance/UE support during activity completion and intermittent seated rest breaks throughout. Pt completing sit<>stand during session with MinA, min verbal cues for LE/UE placement during transfers. Pt transported back to w/c and Pt room in same manner described above where Pt completing toileting with MinA for stand pivot w/c<>BSC over toilet, MinA to steady during clothing management. Pt completed standing at sink to wash hands and brush hair with MinA to steady during task completion. Pt completed stand pivot w/c>EOB with MinA, EOB to supine with MinA where Pt was left supine in bed, bed alarm on, call bell and needs within reach.   Therapy Documentation Precautions:  Precautions Precautions: Fall Precaution Comments: L hemiparesis, impulsive, HOH Restrictions Weight Bearing Restrictions: No   Pain: Pain Assessment Pain Assessment: No/denies pain (session 1 and session 2)  ADL: ADL ADL Comments: Please see functional navigator for ADL status  See Function Navigator for Current Functional Status.   Therapy/Group: Individual Therapy  Raymondo Band 03/21/2017, 5:09 PM

## 2017-03-21 NOTE — Progress Notes (Signed)
[facility]     PROGRESS NOTE  Subjective/Complaints:  Pt eating breakfast. Very upset about being changed to low bed because of comfort, lack of hand controls, inability to move herself in bed, etc. Requested a change back to old bed. RN reports no unsafe behavior by patient.   ROS: pt denies nausea, vomiting, diarrhea, cough, shortness of breath or chest pain    Objective: Vital Signs: Blood pressure 139/84, pulse 63, temperature 97.8 F (36.6 C), resp. rate 18, weight 60.4 kg (133 lb 3.2 oz), SpO2 99 %. No results found. No results for input(s): WBC, HGB, HCT, PLT in the last 72 hours. No results for input(s): NA, K, CL, GLUCOSE, BUN, CREATININE, CALCIUM in the last 72 hours.  Invalid input(s): CO CBG (last 3)   Recent Labs  03/20/17 1705 03/20/17 1739 03/20/17 2113  GLUCAP 63* 121* 138*    Wt Readings from Last 3 Encounters:  03/14/17 60.4 kg (133 lb 3.2 oz)  03/07/17 64.9 kg (143 lb)  01/18/17 67.6 kg (149 lb 0.5 oz)    Physical Exam:  BP 139/84 (BP Location: Left Arm)   Pulse 63   Temp 97.8 F (36.6 C)   Resp 18   Wt 60.4 kg (133 lb 3.2 oz)   SpO2 99%   BMI 22.86 kg/m  Constitutional: She appears well-developed and well-nourished.  HENT: Normocephalic and atraumatic.  Eyes: EOM are normal. No discharge.   Cardiovascular: RRR without murmur. No JVD  Respiratory: CTA Bilaterally without wheezes or rales. Normal effort  GI: Soft. Bowel sounds are normal.   Musculoskeletal: She exhibits no edema or tenderness.  Neurological: She is alert and oriented.  Extremely HOH.  Minimal dysarthria.  Motor: RUE/RLE: 5/5 proximal to distal LUE: 4+/5 with apraxia LLE: HF 4/5, KE 3-/5, ADF/PF 3/5 Skin: Skin is warm and dry.  Psychiatric: She has a normal mood and affect. Her behavior is normal. Thought content normal.    Assessment/Plan: 1. Functional deficits secondary to right CVA which require 3+ hours per day of interdisciplinary  therapy in a comprehensive inpatient rehab setting. Physiatrist is providing close team supervision and 24 hour management of active medical problems listed below. Physiatrist and rehab team continue to assess barriers to discharge/monitor patient progress toward functional and medical goals.  Function:  Bathing Bathing position   Position: Wheelchair/chair at sink  Bathing parts Body parts bathed by patient: Left arm, Chest, Abdomen, Front perineal area, Right arm, Buttocks, Right upper leg, Left upper leg, Right lower leg, Left lower leg Body parts bathed by helper: Back  Bathing assist Assist Level: Touching or steadying assistance(Pt > 75%)      Upper Body Dressing/Undressing Upper body dressing   What is the patient wearing?: Pull over shirt/dress, Bra Bra - Perfomed by patient: Thread/unthread right bra strap, Thread/unthread left bra strap, Hook/unhook bra (pull down sports bra) Bra - Perfomed by helper: Hook/unhook bra (pull down sports bra) Pull over shirt/dress - Perfomed by patient: Thread/unthread right sleeve, Thread/unthread left sleeve, Put head through opening, Pull shirt over trunk Pull over shirt/dress - Perfomed by helper: Pull shirt over trunk        Upper body assist Assist Level: Set up Assistive Device Comment: button hook Set up : To obtain clothing/put away  Lower Body Dressing/Undressing Lower body dressing   What is the patient wearing?: Pants, Non-skid slipper socks Underwear - Performed by patient: Thread/unthread left underwear leg, Thread/unthread right underwear leg, Pull underwear up/down Underwear -  Performed by helper: Thread/unthread right underwear leg, Pull underwear up/down Pants- Performed by patient: Thread/unthread left pants leg, Thread/unthread right pants leg, Pull pants up/down Pants- Performed by helper: Thread/unthread right pants leg Non-skid slipper socks- Performed by patient: Don/doff right sock, Don/doff left sock Non-skid  slipper socks- Performed by helper: Don/doff left sock, Don/doff right sock   Socks - Performed by helper: Don/doff right sock, Don/doff left sock Shoes - Performed by patient: Don/doff right shoe Shoes - Performed by helper: Don/doff left shoe          Lower body assist Assist for lower body dressing: Touching or steadying assistance (Pt > 75%) (standing to pull up pants)      Toileting Toileting Toileting activity did not occur: No continent bowel/bladder event Toileting steps completed by patient: Adjust clothing prior to toileting, Performs perineal hygiene, Adjust clothing after toileting Toileting steps completed by helper: Adjust clothing after toileting Toileting Assistive Devices: Grab bar or rail  Toileting assist Assist level: Touching or steadying assistance (Pt.75%)   Transfers Chair/bed transfer   Chair/bed transfer method: Stand pivot Chair/bed transfer assist level: Touching or steadying assistance (Pt > 75%) Chair/bed transfer assistive device: Armrests     Locomotion Ambulation     Max distance: 25 Assist level: Moderate assist (Pt 50 - 74%)   Wheelchair   Type: Manual Max wheelchair distance: 150 Assist Level: Supervision or verbal cues  Cognition Comprehension Comprehension assist level: Understands basic 75 - 89% of the time/ requires cueing 10 - 24% of the time  Expression Expression assist level: Expresses basic needs/ideas: With no assist  Social Interaction Social Interaction assist level: Interacts appropriately with others with medication or extra time (anti-anxiety, antidepressant).  Problem Solving Problem solving assist level: Solves basic 75 - 89% of the time/requires cueing 10 - 24% of the time  Memory Memory assist level: Recognizes or recalls 75 - 89% of the time/requires cueing 10 - 24% of the time    Medical Problem List and Plan: 1.   Left sided weakness, cognitive deficits with no safety awareness and hard to redirect secondary to  right CVA on 6/27.  Continue CIR therapies  -would like to move patient back to regular bed. While she lacks some insight, she has not attempted to get up by herself and promised me she would not.    2.  DVT Prophylaxis/Anticoagulation: Pharmaceutical: Pradexa 3. Pain Management: tylenol prn 4. Mood: LCSW to follow for evaluation and support 5. Neuropsych: This patient is capable of making decisions on her own behalf. 6. Skin/Wound Care: routine pressure relief measures.  7. Fluids/Electrolytes/Nutrition: Monitor I/O.  8. Anemia:   Hb 11.4 on 6/30--can recheck tomorrow  Cont to monitor 9. DM diet controlled: Hgb A1c- 6.0.  BS stable overall will check BS bid with SSI as needed. Poor intake--d/ced cardiac restrictions and continue CM diet.   reasonable control as of present 10. HTN: monitor BP bid and avoid hypotension.  Continue atenolol daily.    -controlled at present 11. Hypokalemia  -K+ 3.8    -follow up tomorrow.     12. Hypoalbuminemia  Supplement initiated 6/30  -eating well  LOS (Days) 12 A FACE TO FACE EVALUATION WAS PERFORMED  SWARTZ,ZACHARY T 03/21/2017 8:32 AM

## 2017-03-21 NOTE — Progress Notes (Signed)
Occupational Therapy Session Note  Patient Details  Name: Christy Sutton MRN: [id number] Date of Birth: 1925/12/02  Today's Date: 03/21/2017 OT Individual Time: 0804-0900 OT Individual Time Calculation (min): 56 min    Short Term Goals: Week 2:  OT Short Term Goal 1 (Week 2): Pt will be able to stand with min A or less while pulling pants over hips post toileting or during dressing. OT Short Term Goal 2 (Week 2): Pt will demonstrate improved L side awareness by positioning her L foot in safe position prior to standing.   OT Short Term Goal 3 (Week 2): Pt will be able to don socks with set up.    Skilled Therapeutic Interventions/Progress Updates:    Pt seen for ADL training with a focus on postural control and balance along with awareness of LLE positioning. Pt received in bed. She completed all transfers with steadying to min A but with mod verbal cues to attend to L foot and to lock w/c breaks. She tends to just "twist" herself to her chair and needs cuing to step her left foot.  With sit to stands she continues to leave her L foot placed forward and needs cues to place below her knee for improved BOS. When she does this she is able to stand with light A and can hold static balance with close S.  Continued cuing for safe mechanics with donning pants over feet so her sitting balance is secure.  Pt is progressing with her balance and safety awareness as she knows to call for A before standing up. Pt completed all self care and is resting in w/c with all needs met. Quick release belt donned.  Therapy Documentation Precautions:  Precautions Precautions: Fall Precaution Comments: L hemiparesis, impulsive, HOH Restrictions Weight Bearing Restrictions: No    Vital Signs: Therapy Vitals Temp: 97.8 F (36.6 C) Pulse Rate: 63 BP: 139/84 Patient Position (if appropriate): Sitting Oxygen Therapy SpO2: 99 % O2 Device: Not Delivered Pain: Pain Assessment Pain Assessment: No/denies pain Pain  Score: 0-No pain ADL: ADL ADL Comments: Please see functional navigator for ADL status    See Function Navigator for Current Functional Status.   Therapy/Group: Individual Therapy  [place] 03/21/2017, 9:00 AM

## 2017-03-21 NOTE — Progress Notes (Signed)
Physical Therapy Session Note  Patient Details  Name: Christy Sutton MRN: [id number] Date of Birth: 1926/04/06  Today's Date: 03/21/2017 PT Individual Time: 1000-1100 PT Individual Time Calculation (min): 60 min   Short Term Goals: Week 2:  PT Short Term Goal 1 (Week 2): Pt will demonstrate bed mobility with S PT Short Term Goal 2 (Week 2): Pt will demostrate stand pivot transfers consistent min guard PT Short Term Goal 3 (Week 2): pt will demonstrate gait with LRAD and modA PT Short Term Goal 4 (Week 2): Pt will demonstrate dynamic standing balance modA x5 min  Skilled Therapeutic Interventions/Progress Updates: Pt received seated in w/c, denies pain and agreeable to treatment. Pt expresses dissatisfaction with new bed, states she slept very poorly all night and bed was angled which made her uncomfortable; repeatedly asking therapist if bed would be replaced. New bed in room at end of session and pt very pleased. Transported to/from gym totalA for energy conservation. Gait x75' with min/modA using RW and LLE ace wrap dorsiflexion assist; tactile cueing at L glute and trunk for hip/trunk extension in LLE stance. Standing balance with RLE toe taps to 3" step for LLE weight bearing and stance control; variable min>maxA increased assist as fatigued. Lateral step ups LLE attempted at 6" step, unable to complete. Performed on 3" step with mod/maxA for powering up through LLE and stance control. Stand>sit throughout session requires max verbal/tactile cues to correctly place hands on w/c, prevent LOB to L side and control descent. Standing forward/backward weight shifts with staggered stance for focus on weight shift onto LLE during gait while engaged in LUE reaching activity forward to L side while performing clothespin tree; max tactile cueing at L glute/quad for stance control. Returned to room at end of session; remained seated in w/c at end of session, all needs in reach.      Therapy  Documentation Precautions:  Precautions Precautions: Fall Precaution Comments: L hemiparesis, impulsive, HOH Restrictions Weight Bearing Restrictions: No Pain: Pain Assessment Pain Assessment: No/denies pain Pain Score: 0-No pain   See Function Navigator for Current Functional Status.   Therapy/Group: Individual Therapy  [name] 03/21/2017, 11:50 AM

## 2017-03-21 NOTE — Patient Care Conference (Signed)
Inpatient RehabilitationTeam Conference and Plan of Care Update Date: 03/20/2017   Time: 2:00 PM    Patient Name: Christy Sutton      Medical Record Number: [id number]  Date of Birth: 12-25-1925 Sex: Female         Room/Bed: 4W05C/4W05C-01 Payor Info: Payor: MEDICARE / Plan: MEDICARE PART A AND B / Product Type: *No Product type* /    Admitting Diagnosis: CVA  Admit Date/Time:  03/09/2017  4:24 PM Admission Comments: No comment available   Primary Diagnosis:  <principal problem not specified> Principal Problem: <principal problem not specified>  Patient Active Problem List   Diagnosis Date Noted  . Left hemiparesis ([place])   . Acute blood loss anemia   . Benign essential HTN   . Hypokalemia   . Hypoalbuminemia due to protein-calorie malnutrition ([place])   . Stroke due to embolism ([place]) 03/09/2017  . Diabetes mellitus type 2 in nonobese ([facility])   . History of CVA (cerebrovascular accident)   . Cerebral embolism with cerebral infarction 03/07/2017  . Stroke ([place]) 01/17/2017  . TIA (transient ischemic attack) 01/17/2017  . Dysmetria 01/17/2017  . Essential hypertension 01/17/2017  . Diabetes mellitus with complication ([place]) 90/30/0923  . Permanent atrial fibrillation ([place]) 01/17/2017  . Ischemic stroke [facility])     Expected Discharge Date: Expected Discharge Date: 03/31/17  Team Members Present: Physician leading conference: Dr. Alger Simons Social Worker Present: Lennart Pall, LCSW Nurse Present: Rayetta Pigg, RN PT Present: Kem Parkinson, PT OT Present: Roanna Epley, Jenkins, OT SLP Present: Weston Anna, SLP PPS Coordinator present : Daiva Nakayama, RN, CRRN     Current Status/Progress Goal Weekly Team Focus  Medical   heart rate controlled, left hemiparesis improving.   improve functional mobility, safety  nutrition, bp/hr control   Bowel/Bladder   Continent of bowel and bladder; LBM 7/9- has some difficulty starting stream.  Mod I assist  Assess and treat for  constipation as needed   Swallow/Nutrition/ Hydration             ADL's   set up UB self care, min A LB self care, min-mod stand pivot transfers, mod A dynamic standing due to L lean and decreased L side awareness, pt actively used LUE in self care  min A overall  cognitve awareness for safety, postural control and awareness, ADL training, standing balance, pt/family education   Mobility   S bed mobility, min/modA transfers and gait with RW, continues to be limited by impulsivity, poor awareness of deficits  S bed mobility and transfers, minA gait in home and stairs for strengthening  L attention, L NMR, dynamic sitting/standing balance, transfer and gait training   Communication             Safety/Cognition/ Behavioral Observations            Pain   Denies pain  < 3  Assess and treat for pain q shift and prn   Skin   Blanchable redness to sacrum  Mod I assist  Assess skin q shift and prn    Rehab Goals Patient on target to meet rehab goals: Yes *See Care Plan and progress notes for long and short-term goals.     Barriers to Discharge  Current Status/Progress Possible Resolutions Date Resolved   Physician              continued medical mgt, adaptive techniques        Nursing  PT                     OT                   SLP                  SW                Discharge Planning/Teaching Needs:  Pt will return home with her family and they will arrange 24/7 care.  Family will come in to go through family education prior to d/c.   Team Discussion:  Pt doing well medically.  She is ambulating with RW with mod A and transfers at min A, still with cues for safety.  Is making steady progress with ADLs.  Pt is on target for 03-31-17 d/c.  Revisions to Treatment Plan:  none    Continued Need for Acute Rehabilitation Level of Care: The patient requires daily medical management by a physician with specialized training in physical medicine and rehabilitation  for the following conditions: Daily direction of a multidisciplinary physical rehabilitation program to ensure safe treatment while eliciting the highest outcome that is of practical value to the patient.: Yes Daily medical management of patient stability for increased activity during participation in an intensive rehabilitation regime.: Yes Daily analysis of laboratory values and/or radiology reports with any subsequent need for medication adjustment of medical intervention for : Neurological problems;Blood pressure problems  [name], [name] 03/21/2017, 2:35 PM

## 2017-03-22 ENCOUNTER — Inpatient Hospital Stay (HOSPITAL_COMMUNITY): Payer: Medicare Other | Admitting: Occupational Therapy

## 2017-03-22 ENCOUNTER — Inpatient Hospital Stay (HOSPITAL_COMMUNITY): Payer: Medicare Other | Admitting: *Deleted

## 2017-03-22 ENCOUNTER — Inpatient Hospital Stay (HOSPITAL_COMMUNITY): Payer: Medicare Other | Admitting: Physical Therapy

## 2017-03-22 LAB — GLUCOSE, CAPILLARY
Glucose-Capillary: 156 mg/dL — ABNORMAL HIGH (ref 65–99)
Glucose-Capillary: 111 mg/dL — ABNORMAL HIGH (ref 65–99)

## 2017-03-22 LAB — CBC
HCT: 35.1 % — ABNORMAL LOW (ref 36.0–46.0)
HEMOGLOBIN: 11.6 g/dL — AB (ref 12.0–15.0)
MCH: 30.1 pg (ref 26.0–34.0)
MCHC: 33 g/dL (ref 30.0–36.0)
MCV: 90.9 fL (ref 78.0–100.0)
Platelets: 157 10*3/uL (ref 150–400)
RBC: 3.86 MIL/uL — ABNORMAL LOW (ref 3.87–5.11)
RDW: 13.8 % (ref 11.5–15.5)
WBC: 5.4 10*3/uL (ref 4.0–10.5)

## 2017-03-22 LAB — BASIC METABOLIC PANEL
ANION GAP: 7 (ref 5–15)
BUN: 14 mg/dL (ref 6–20)
CALCIUM: 8.8 mg/dL — AB (ref 8.9–10.3)
CHLORIDE: 108 mmol/L (ref 101–111)
CO2: 26 mmol/L (ref 22–32)
CREATININE: 0.59 mg/dL (ref 0.44–1.00)
GFR calc Af Amer: >60 mL/min (ref >60)
GFR calc non Af Amer: >60 mL/min (ref >60)
GLUCOSE: 107 mg/dL — AB (ref 65–99)
Potassium: 3.4 mmol/L — ABNORMAL LOW (ref 3.5–5.1)
Sodium: 141 mmol/L (ref 135–145)

## 2017-03-22 MED ORDER — POTASSIUM CHLORIDE CRYS ER 20 MEQ PO TBCR
20.0000 meq | EXTENDED_RELEASE_TABLET | Freq: Every day | ORAL | Status: DC
  Administered 2017-03-22 – 2017-03-28 (×7): 20 meq via ORAL
  Filled 2017-03-22 (×7): qty 1

## 2017-03-22 NOTE — Progress Notes (Signed)
Physical Therapy Session Note  Patient Details  Name: Christy Sutton MRN: [id number] Date of Birth: 23-Nov-1925  Today's Date: 03/22/2017 PT Individual Time: 0800-0900 and 5830-9407 PT Individual Time Calculation (min): 60 min and 30 min (total 90 min)  Short Term Goals: Week 2:  PT Short Term Goal 1 (Week 2): Pt will demonstrate bed mobility with S PT Short Term Goal 2 (Week 2): Pt will demostrate stand pivot transfers consistent min guard PT Short Term Goal 3 (Week 2): pt will demonstrate gait with LRAD and modA PT Short Term Goal 4 (Week 2): Pt will demonstrate dynamic standing balance modA x5 min  Skilled Therapeutic Interventions/Progress Updates: Tx 1: Pt received supine in bed, denies pain and agreeable to treatment. Supine>sit with S and bedrails with HOB flat. Stand pivot transfer to w/c with minA. Dressing performed in w/c mod/maxA for time management. Pt engaged in [employer] robotic arm device for 40 min for focus on functional reach and coordination with LUE in seated position. Pt demonstrates increased accuracy and coordination over the course of 608 repetitions in multidirectional reach. Utilized path marking during training to help visualize path and provide feedback regarding coordination and accuracy during task. Provided tactile cueing to reduce truncal compensation with reaching, reduced assist needed through trial to maintain stable trunk. Gait trial x75' with modA for L hip extension, trunk extension and verbal cues for upright posture and L foot placement. Remained seated in w/c at end of session, quick release belt intact and all needs in reach.   Tx 2: Pt received seated in recliner; denies pain and agreeable to treatment. Stand pivot transfer 180 degrees to w/c with minA; cues for foot placement and completing turn prior to sitting. BUE w/c propulsion 2x150' for LUE NMR, strengthening and aerobic endurance. Gait 2x40' with L PLS AFO and modA; cues for upright trunk and forward  visual gaze to improve LLE progression and hip extension in LLE stance. Returned to bed stand pivot minA. Sit>supine with S, HOB flat and no bedrails. Pt refuses non-skid socks at this time; NT alerted and pt verbalizes understanding not to get OOB without assist. Remained in bed with alarm intact and all needs in reach.      Therapy Documentation Precautions:  Precautions Precautions: Fall Precaution Comments: L hemiparesis, impulsive, HOH Restrictions Weight Bearing Restrictions: No Pain: Pain Assessment Pain Assessment: No/denies pain Pain Score: 0-No pain   See Function Navigator for Current Functional Status.   Therapy/Group: Individual Therapy  [name] 03/22/2017, 9:33 AM

## 2017-03-22 NOTE — Progress Notes (Signed)
Occupational Therapy Session Note  Patient Details  Name: Christy Sutton MRN: [id number] Date of Birth: 07-09-1926  Today's Date: 03/22/2017 OT Individual Time: 0900-1015 and 1300-1337 OT Individual Time Calculation (min): 75 min and 37 min    Short Term Goals: Week 2:  OT Short Term Goal 1 (Week 2): Pt will be able to stand with min A or less while pulling pants over hips post toileting or during dressing. OT Short Term Goal 2 (Week 2): Pt will demonstrate improved L side awareness by positioning her L foot in safe position prior to standing.   OT Short Term Goal 3 (Week 2): Pt will be able to don socks with set up.    Skilled Therapeutic Interventions/Progress Updates:    Session 1 with focus on functional mobility transfers, dynamic sitting and standing balance, L NMR, and L FMC. Pt received sitting up in w/c ready for OT treatment session. Pt completed stand pivot w/c<>BSC over toilet using grab bar with MinA, MinA to steady during clothing management and during standing to perform toilet hygiene after BM. Pt then stood at sink to perform grooming ADLs with MinA to steady throughout task completion. Pt declined bathing/dressing ADLs, however willing to demonstrate use of button hook, to button one button on shirt, completing with increased time. Pt transported to rehab gym total A via w/c. Pt participates in multiple seated and standing dynamic activities integrating PNF movements with LUE, L pinch grip, and L fine motor/gross coordination. Pt reaching outside BOS to L and R during seated and standing activity completion with minGuard to minA for steady assist. Pt intermittently requires modA to steady and mod verbal cues for safety throughout. Pt also completing standing at RW and alternating LE taps on step with minA to steady and facilitating lifting L LE vs sliding. Pt returned to room where Pt was left seated in w/c, QRB donned, call bell and needs within reach.   Session 2 with further focus  on dynamic standing, sit<>stand and L NMR. Pt presented seated in recliner, completed stand pivot to/from  w/c with minA (start and end of session). Pt totalA to/from rehab gym via w/c where Pt stood outside parallel bars with bil UE support, alternating between LE stepping forward/back and sideways, facilitating lifting L LE throughout to increase Pt's safety and independence when stepping during functional mobility transfers. Pt stood outside parallel bars using RUE support while using LUE to complete dynamic card matching activity. Pt requires MinA to steady during all activity completion at parallel bars and for sit<>Stand. After return to room Pt completing stand pivot transfer w/c<>BSC over toilet with grab bar with minA, Pt attempting to completed clothing management with minGuard however ultimately requires minA to steady during task completion. Ended session Pt sitting up in recliner, call bell and needs within reach.     Therapy Documentation Precautions:  Precautions Precautions: Fall Precaution Comments: L hemiparesis, impulsive, HOH Restrictions Weight Bearing Restrictions: No    Pain: Pain Assessment Pain Assessment: No/denies pain (session 1 and 2)  Pain Score: 0-No pain ADL: ADL ADL Comments: Please see functional navigator for ADL status   See Function Navigator for Current Functional Status.   Therapy/Group: Individual Therapy  Raymondo Band 03/22/2017, 10:41 AM

## 2017-03-22 NOTE — Progress Notes (Signed)
PHYSICAL MEDICINE & REHABILITATION     PROGRESS NOTE  Subjective/Complaints:  Happy about bed. Slept well. No problems yesterday or overnight.   ROS: pt denies nausea, vomiting, diarrhea, cough, shortness of breath or chest pain    Objective: Vital Signs: Blood pressure 134/81, pulse 66, temperature 97.9 F (36.6 C), temperature source Oral, resp. rate 17, weight 58.7 kg (129 lb 6.6 oz), SpO2 99 %. No results found.  Recent Labs  03/22/17 0541  WBC 5.4  HGB 11.6*  HCT 35.1*  PLT 157    Recent Labs  03/22/17 0541  NA 141  K 3.4*  CL 108  GLUCOSE 107*  BUN 14  CREATININE 0.59  CALCIUM 8.8*   CBG (last 3)   Recent Labs  03/20/17 2113 03/21/17 1208 03/21/17 1649  GLUCAP 138* 174* 96    Wt Readings from Last 3 Encounters:  03/22/17 58.7 kg (129 lb 6.6 oz)  03/07/17 64.9 kg (143 lb)  01/18/17 67.6 kg (149 lb 0.5 oz)    Physical Exam:  BP 134/81 (BP Location: Left Arm)   Pulse 66   Temp 97.9 F (36.6 C) (Oral)   Resp 17   Wt 58.7 kg (129 lb 6.6 oz)   SpO2 99%   BMI 22.21 kg/m  Constitutional: She appears well-developed and well-nourished.  HENT: Normocephalic and atraumatic.  Eyes: EOM are normal. No discharge.   Cardiovascular: RRR without murmur. No JVD   Respiratory: CTA Bilaterally without wheezes or rales. Normal effort  GI: Soft. Bowel sounds are normal.   Musculoskeletal: She exhibits no edema or tenderness.  Neurological: She is alert and oriented.  Extremely HOH.  Minimal dysarthria.  Motor: RUE/RLE: 5/5 proximal to distal LUE: 4+/5 with improved fmc LLE: HF 4/5, KE 3/5, ADF/PF 3/5 Skin: Skin is warm and dry.  Psychiatric: She has a normal mood and affect. Her behavior is normal. Thought content normal.    Assessment/Plan: 1. Functional deficits secondary to right CVA which require 3+ hours per day of interdisciplinary therapy in a comprehensive inpatient rehab setting. Physiatrist is providing close team supervision and 24  hour management of active medical problems listed below. Physiatrist and rehab team continue to assess barriers to discharge/monitor patient progress toward functional and medical goals.  Function:  Bathing Bathing position   Position: Wheelchair/chair at sink  Bathing parts Body parts bathed by patient: Left arm, Chest, Abdomen, Front perineal area, Right arm, Buttocks, Right upper leg, Left upper leg, Right lower leg, Left lower leg Body parts bathed by helper: Back  Bathing assist Assist Level: Touching or steadying assistance(Pt > 75%)      Upper Body Dressing/Undressing Upper body dressing   What is the patient wearing?: Pull over shirt/dress, Bra Bra - Perfomed by patient: Thread/unthread right bra strap, Thread/unthread left bra strap, Hook/unhook bra (pull down sports bra) Bra - Perfomed by helper: Hook/unhook bra (pull down sports bra) Pull over shirt/dress - Perfomed by patient: Thread/unthread right sleeve, Thread/unthread left sleeve, Put head through opening, Pull shirt over trunk Pull over shirt/dress - Perfomed by helper: Pull shirt over trunk        Upper body assist Assist Level: Set up Assistive Device Comment: button hook Set up : To obtain clothing/put away  Lower Body Dressing/Undressing Lower body dressing   What is the patient wearing?: Socks, Shoes, Underwear, Pants Underwear - Performed by patient: Thread/unthread left underwear leg, Thread/unthread right underwear leg, Pull underwear up/down Underwear - Performed by helper: Thread/unthread right underwear leg,  Pull underwear up/down Pants- Performed by patient: Thread/unthread left pants leg, Thread/unthread right pants leg, Pull pants up/down Pants- Performed by helper: Thread/unthread right pants leg Non-skid slipper socks- Performed by patient: Don/doff right sock, Don/doff left sock Non-skid slipper socks- Performed by helper: Don/doff left sock, Don/doff right sock Socks - Performed by patient:  Don/doff right sock Socks - Performed by helper: Don/doff left sock Shoes - Performed by patient: Don/doff right shoe Shoes - Performed by helper: Don/doff left shoe, Fasten right, Fasten left          Lower body assist Assist for lower body dressing: Touching or steadying assistance (Pt > 75%)      Toileting Toileting Toileting activity did not occur: No continent bowel/bladder event Toileting steps completed by patient: Adjust clothing prior to toileting, Performs perineal hygiene, Adjust clothing after toileting Toileting steps completed by helper: Adjust clothing after toileting Toileting Assistive Devices: Grab bar or rail  Toileting assist Assist level: Touching or steadying assistance (Pt.75%)   Transfers Chair/bed transfer   Chair/bed transfer method: Stand pivot Chair/bed transfer assist level: Touching or steadying assistance (Pt > 75%) Chair/bed transfer assistive device: Armrests     Locomotion Ambulation     Max distance: 75 Assist level: Moderate assist (Pt 50 - 74%)   Wheelchair   Type: Manual Max wheelchair distance: 150 Assist Level: Supervision or verbal cues  Cognition Comprehension Comprehension assist level: Understands basic 75 - 89% of the time/ requires cueing 10 - 24% of the time  Expression Expression assist level: Expresses basic needs/ideas: With no assist  Social Interaction Social Interaction assist level: Interacts appropriately with others with medication or extra time (anti-anxiety, antidepressant).  Problem Solving Problem solving assist level: Solves basic 75 - 89% of the time/requires cueing 10 - 24% of the time  Memory Memory assist level: Recognizes or recalls 75 - 89% of the time/requires cueing 10 - 24% of the time    Medical Problem List and Plan: 1.   Left sided weakness, cognitive deficits with no safety awareness and hard to redirect secondary to right CVA on 6/27.  Continue CIR therapies  -pt remains motivated.    2.  DVT  Prophylaxis/Anticoagulation: Pharmaceutical: Pradexa 3. Pain Management: tylenol prn 4. Mood: LCSW to follow for evaluation and support 5. Neuropsych: This patient is capable of making decisions on her own behalf. 6. Skin/Wound Care: routine pressure relief measures.  7. Fluids/Electrolytes/Nutrition: Monitor I/O  -labs all reviewed.  8. Anemia:   Hb 11.6 7/12  Cont to monitor 9. DM diet controlled: Hgb A1c- 6.0.  BS stable overall will check BS bid with SSI as needed. Poor intake--d/ced cardiac restrictions and continue CM diet.   reasonable control as of present 10. HTN: monitor BP bid and avoid hypotension.  Continue atenolol daily.    -controlled at present 11. Hypokalemia  -K+ 3.4 7/12    -continue to replete    LOS (Days) 13 A FACE TO FACE EVALUATION WAS PERFORMED  Lyla Jasek T 03/22/2017 7:45 AM

## 2017-03-23 ENCOUNTER — Inpatient Hospital Stay (HOSPITAL_COMMUNITY): Payer: Medicare Other | Admitting: Occupational Therapy

## 2017-03-23 ENCOUNTER — Inpatient Hospital Stay (HOSPITAL_COMMUNITY): Payer: Medicare Other

## 2017-03-23 ENCOUNTER — Inpatient Hospital Stay (HOSPITAL_COMMUNITY): Payer: Medicare Other | Admitting: *Deleted

## 2017-03-23 LAB — GLUCOSE, CAPILLARY
GLUCOSE-CAPILLARY: 172 mg/dL — AB (ref 65–99)
GLUCOSE-CAPILLARY: 107 mg/dL — AB (ref 65–99)

## 2017-03-23 NOTE — Progress Notes (Signed)
Occupational Therapy Session Note  Patient Details  Name: Christy Sutton MRN: [id number] Date of Birth: 1926/07/21  Today's Date: 03/23/2017 OT Individual Time: 1100-1200 OT Individual Time Calculation (min): 60 min    Skilled Therapeutic Interventions/Progress Updates:    1:1 Focus on performing functional ambulation with RW with AFO donned with min A  With mod VC for attention to left foot placement, to decr speed for safety, etc. Pt ambulated from room to the dayroom. Focused on modified OTAGO exercises inclduing: hamstring curl in standing, long arch quad, hip abduction, mini squats, sit to stands, heel/ toe raises) with min A for balance. Pt with difficulty with hamstring curls of right LE when left LE needed to be the sole support requiring mod VC and tactile cues for sustained contraction. REturn to room via w/c due to fatigue.  Min A stand pivot transfer into recliner in prep for lunch with quick release donned and family present.    Therapy Documentation Precautions:  Precautions Precautions: Fall Precaution Comments: L hemiparesis, impulsive, HOH Restrictions Weight Bearing Restrictions: No Pain:  no c/o pain in session just fatigue and that she felt different today than yesterday. ADL: ADL ADL Comments: Please see functional navigator for ADL status  See Function Navigator for Current Functional Status.   Therapy/Group: Individual Therapy  [name] 03/23/2017, 1:43 PM

## 2017-03-23 NOTE — Progress Notes (Signed)
[place] PHYSICAL MEDICINE & REHABILITATION     PROGRESS NOTE  Subjective/Complaints:  No new complaints. Slept well with "new" bed! No pain  ROS: pt denies nausea, vomiting, diarrhea, cough, shortness of breath or chest pain     Objective: Vital Signs: Blood pressure (!) 124/52, pulse 62, temperature 97.9 F (36.6 C), temperature source Oral, resp. rate 16, weight 58.7 kg (129 lb 6.6 oz), SpO2 98 %. No results found.  Recent Labs  03/22/17 0541  WBC 5.4  HGB 11.6*  HCT 35.1*  PLT 157    Recent Labs  03/22/17 0541  NA 141  K 3.4*  CL 108  GLUCOSE 107*  BUN 14  CREATININE 0.59  CALCIUM 8.8*   CBG (last 3)   Recent Labs  03/21/17 1649 03/22/17 1147 03/22/17 1627  GLUCAP 96 156* 111*    Wt Readings from Last 3 Encounters:  03/22/17 58.7 kg (129 lb 6.6 oz)  03/07/17 64.9 kg (143 lb)  01/18/17 67.6 kg (149 lb 0.5 oz)    Physical Exam:  BP (!) 124/52 (BP Location: Right Arm)   Pulse 62   Temp 97.9 F (36.6 C) (Oral)   Resp 16   Wt 58.7 kg (129 lb 6.6 oz)   SpO2 98%   BMI 22.21 kg/m  Constitutional: She appears well-developed and well-nourished.  HENT: Normocephalic and atraumatic.  Eyes: EOM are normal. No discharge.   Cardiovascular: RRR without murmur. No JVD    Respiratory: CTA Bilaterally without wheezes or rales. Normal effort   GI: Soft. Bowel sounds are normal.   Musculoskeletal: She exhibits no edema or tenderness.  Neurological: She is alert and oriented.  Extremely HOH.  Minimal dysarthria.  Motor: RUE/RLE: 5/5 proximal to distal LUE: 4+/5 with improved fmc LLE: HF 4/5, KE 3/5, ADF/PF 3/5 Skin: Skin is warm and dry.  Psychiatric: She has a normal mood and affect. Her behavior is normal. Thought content normal.    Assessment/Plan: 1. Functional deficits secondary to right CVA which require 3+ hours per day of interdisciplinary therapy in a comprehensive inpatient rehab setting. Physiatrist is providing close team supervision and 24  hour management of active medical problems listed below. Physiatrist and rehab team continue to assess barriers to discharge/monitor patient progress toward functional and medical goals.  Function:  Bathing Bathing position Bathing activity did not occur: Refused Position: Wheelchair/chair at sink  Bathing parts Body parts bathed by patient: Left arm, Chest, Abdomen, Front perineal area, Right arm, Buttocks, Right upper leg, Left upper leg, Right lower leg, Left lower leg Body parts bathed by helper: Back  Bathing assist Assist Level: Touching or steadying assistance(Pt > 75%)      Upper Body Dressing/Undressing Upper body dressing   What is the patient wearing?: Pull over shirt/dress, Bra Bra - Perfomed by patient: Thread/unthread right bra strap, Thread/unthread left bra strap, Hook/unhook bra (pull down sports bra) Bra - Perfomed by helper: Hook/unhook bra (pull down sports bra) Pull over shirt/dress - Perfomed by patient: Thread/unthread right sleeve, Thread/unthread left sleeve, Put head through opening, Pull shirt over trunk Pull over shirt/dress - Perfomed by helper: Pull shirt over trunk        Upper body assist Assist Level: Set up Assistive Device Comment: button hook Set up : To obtain clothing/put away  Lower Body Dressing/Undressing Lower body dressing   What is the patient wearing?: Socks, Shoes, Underwear, Pants Underwear - Performed by patient: Thread/unthread left underwear leg, Thread/unthread right underwear leg, Pull underwear up/down Underwear -  Performed by helper: Thread/unthread right underwear leg, Pull underwear up/down Pants- Performed by patient: Thread/unthread left pants leg, Thread/unthread right pants leg, Pull pants up/down Pants- Performed by helper: Thread/unthread right pants leg Non-skid slipper socks- Performed by patient: Don/doff right sock, Don/doff left sock Non-skid slipper socks- Performed by helper: Don/doff left sock, Don/doff right  sock Socks - Performed by patient: Don/doff right sock Socks - Performed by helper: Don/doff left sock Shoes - Performed by patient: Don/doff right shoe Shoes - Performed by helper: Don/doff left shoe, Fasten right, Fasten left          Lower body assist Assist for lower body dressing: Touching or steadying assistance (Pt > 75%)      Toileting Toileting Toileting activity did not occur: No continent bowel/bladder event Toileting steps completed by patient: Adjust clothing prior to toileting, Performs perineal hygiene, Adjust clothing after toileting Toileting steps completed by helper: Adjust clothing after toileting Toileting Assistive Devices: Grab bar or rail  Toileting assist Assist level: Touching or steadying assistance (Pt.75%)   Transfers Chair/bed transfer   Chair/bed transfer method: Stand pivot Chair/bed transfer assist level: Touching or steadying assistance (Pt > 75%) Chair/bed transfer assistive device: Armrests     Locomotion Ambulation     Max distance: 40 Assist level: Moderate assist (Pt 50 - 74%)   Wheelchair   Type: Manual Max wheelchair distance: 150 Assist Level: Supervision or verbal cues  Cognition Comprehension Comprehension assist level: Understands basic 75 - 89% of the time/ requires cueing 10 - 24% of the time  Expression Expression assist level: Expresses basic needs/ideas: With no assist  Social Interaction Social Interaction assist level: Interacts appropriately with others with medication or extra time (anti-anxiety, antidepressant).  Problem Solving Problem solving assist level: Solves basic 75 - 89% of the time/requires cueing 10 - 24% of the time  Memory Memory assist level: Recognizes or recalls 75 - 89% of the time/requires cueing 10 - 24% of the time    Medical Problem List and Plan: 1.   Left sided weakness, cognitive deficits with no safety awareness and hard to redirect secondary to right CVA on 6/27.  Continue CIR  therapies  -pt making gains with balance and gait   2.  DVT Prophylaxis/Anticoagulation: Pharmaceutical: Pradexa 3. Pain Management: tylenol prn 4. Mood: LCSW to follow for evaluation and support 5. Neuropsych: This patient is capable of making decisions on her own behalf. 6. Skin/Wound Care: routine pressure relief measures.  7. Fluids/Electrolytes/Nutrition: Monitor I/O  -labs all reviewed.  8. Anemia:   Hb 11.6 7/12  Cont to monitor 9. DM diet controlled: Hgb A1c- 6.0.  BS stable overall will check BS bid with SSI as needed.  -CM diet  -reasonable control 10. HTN: monitor BP bid and avoid hypotension.  Continue atenolol daily.    -good control 11. Hypokalemia  -K+ 3.4 7/12    -continue to replete    LOS (Days) 14 A FACE TO FACE EVALUATION WAS PERFORMED  SWARTZ,ZACHARY T 03/23/2017 8:57 AM

## 2017-03-23 NOTE — Progress Notes (Addendum)
Social Work Patient ID: Christy Sutton, female   DOB: 1925/11/06, 83 y.o.   MRN: [id number]   CSW met with pt on 03-22-17 and spoke with pt's dtr-in-law to update them on team conference discussion.  Explained d/c date is still set for 03-31-17 and dtr-in-law will come for family education closer to that day, although she is here often for visits and will come to observe therapy 03-23-17.  CSW will continue to follow and assist as needed.  CSW met pt's dtr-in-law and son when they were here to observe therapies today.  They are concerned that pt is complaining that she had another stroke.  RN and PA have been made aware.  Pt is stable at this time, but they will continue to monitor her.  Family appreciative.

## 2017-03-23 NOTE — Progress Notes (Signed)
Pt up with therapy, therapist noted that no difference in functional status from yesterday. Pt tolerated therapy session well, no neuro changes noted. Continue to monitor.Margarito Liner

## 2017-03-23 NOTE — Progress Notes (Signed)
Physical Therapy Weekly Progress Note  Patient Details  Name: Christy Sutton MRN: [id number] Date of Birth: [birth year]  Beginning of progress report period: March 16, 2017 End of progress report period: March 23, 2017  Today's Date: 03/23/2017 PT Individual Time: 9833-8250 PT Individual Time Calculation (min): 60 min   Patient has met 3 of 4 short term goals.  Pt currently requires minA overall for transfers and gait with RW and AFO. Pt demonstrating improved sitting and standing balance, safety awareness, L attention. No family present for any PT sessions with primary therapist so far; will require hands on training prior to d/c.   Patient continues to demonstrate the following deficits muscle weakness, decreased cardiorespiratoy endurance, impaired timing and sequencing, abnormal tone, unbalanced muscle activation, motor apraxia, decreased coordination and decreased motor planning, decreased midline orientation, decreased attention to left and decreased motor planning, decreased attention, decreased awareness, decreased problem solving, decreased safety awareness, decreased memory and delayed processing and decreased sitting balance, decreased standing balance, decreased postural control, hemiplegia and decreased balance strategies and therefore will continue to benefit from skilled PT intervention to increase functional independence with mobility.  Patient progressing toward long term goals..  Continue plan of care.  PT Short Term Goals Week 2:  PT Short Term Goal 1 (Week 2): Pt will demonstrate bed mobility with S PT Short Term Goal 1 - Progress (Week 2): Met PT Short Term Goal 2 (Week 2): Pt will demostrate stand pivot transfers consistent min guard PT Short Term Goal 2 - Progress (Week 2): Progressing toward goal PT Short Term Goal 3 (Week 2): pt will demonstrate gait with LRAD and modA PT Short Term Goal 3 - Progress (Week 2): Met PT Short Term Goal 4 (Week 2): Pt will demonstrate dynamic  standing balance modA x5 min PT Short Term Goal 4 - Progress (Week 2): Met Week 3:  PT Short Term Goal 1 (Week 3): =LTG due to estimated LOS  Skilled Therapeutic Interventions/Progress Updates: Pt received supine in bed, denies pain and agreeable to treatment. Supine>sit with S and increased time, HOB elevated. Transfer bed>w/c with minA, improved foot placement and attention to LLE. Upper body dressing performed with setupA to retrieve shirt and button hook. Lower body dressing requires minA for standing balance. MinA for shoes to don L shoe d/t AFO. Dynamic gait in ADL apartment kitchen and bedroom. Engaged in dynamic UE reaching to retrieve items in cabinets to locate what groceries were needed for cooking task next week. Min guard for balance overall with significantly improved righting reactions and attention to LLE for balance compared to previous sessions. Pt performed bed making task with min guard for balance during gait. Sit <>stand from recliner with min guard. Pt demonstrates reaching with LUE to open blinds without assist. Pt reporting increased LLE/UE weakness and is very concerned she has had a TIA; discussed with pt that she has functionally improved from yesterday and previous sessions. Discussed that AFO was only trialed for <10 min yesterday and at this point in the session she has been wearing it with dynamic ambulation for almost an hour and was likely fatigued; additionally therapist providing less assist throughout session to facilitate pt balance reactions and awareness of LOBs. Pt continues to state "That might be true dear, but I just know because I feel it, I'm weaker". Alerted RN to pt's concern of new CVA or TIA despite no obvious change in neurological status. Transferred to recliner at end of session minA. Remained seated with quick release  belt intact and all needs in reach.      Therapy Documentation Precautions:  Precautions Precautions: Fall Precaution Comments: L  hemiparesis, impulsive, HOH Restrictions Weight Bearing Restrictions: No   See Function Navigator for Current Functional Status.  Therapy/Group: Individual Therapy  [name] 03/23/2017, 3:01 PM

## 2017-03-23 NOTE — Progress Notes (Signed)
Occupational Therapy Session Note  Patient Details  Name: Christy Sutton MRN: [id number] Date of Birth: Sep 20, 1925  Today's Date: 03/23/2017 OT Individual Time: 1415-1530 OT Individual Time Calculation (min): 75 min    Short Term Goals: Week 2:  OT Short Term Goal 1 (Week 2): Pt will be able to stand with min A or less while pulling pants over hips post toileting or during dressing. OT Short Term Goal 2 (Week 2): Pt will demonstrate improved L side awareness by positioning her L foot in safe position prior to standing.   OT Short Term Goal 3 (Week 2): Pt will be able to don socks with set up.    Skilled Therapeutic Interventions/Progress Updates:    1:1. No c/o pain. Pt reporting L side not working as well as yesterday. Pt propels w/c/from all tx destinations with increased time for general strengthening and endurance of BUEs. Pt stands at dynavision with CGA for 5 rounds of 3 min with seated rest breaks in between. Pt able to follow commands to select green lights with LUE and red lights with RUE. LUE response time slower 2/2 ataxia and decreased coordination. Pt completes pipe tree figure with significantly increased time and question cues to problem solve selecting appropriate pieces based off of picture. Pt ends session completing 192 repetitions for reaching virtual target on Bionik inmotion arm while seated edge of w/c demonstrating  Improved initiation time and path error over 160 repetitions. Exited session with pt seated in w/c with call light in reach, QRB donned and family in room.  Therapy Documentation Precautions:  Precautions Precautions: Fall Precaution Comments: L hemiparesis, impulsive, HOH Restrictions Weight Bearing Restrictions: No   Pain:   ADL: ADL ADL Comments: Please see functional navigator for ADL status  See Function Navigator for Current Functional Status.   Therapy/Group: Individual Therapy  Tonny Branch 03/23/2017, 3:53 PM

## 2017-03-23 NOTE — Progress Notes (Signed)
PT reported pt stated she thought shea had am ini stroke during the night. Pt reports that she is noticably weaker in the left leg than yesterday. Denies numbness, tingling of ext or other changes other than left leg. Wearing AFO and states left arm is heavy but able to move it like before, just feels tired, worn out from therapy. PT noted they do not see motorical changes from yesterday in pt and she did well in therapy session. Pt able to stand and pivot to wheelchair this morning from bed however kept telling self to stand tall and set left leg straight before moving as if giving directions. No note if this is different from before. Pam Love notrified of pt report and PT report and note to monitor neuro status for now. PT checked and understands to notify staff if symptoms worsen or changes noted in other extremenities. Remains alert, oriented, verbally responsive no ditress at present. Margarito Liner

## 2017-03-24 LAB — GLUCOSE, CAPILLARY
GLUCOSE-CAPILLARY: 94 mg/dL (ref 65–99)
GLUCOSE-CAPILLARY: 201 mg/dL — AB (ref 65–99)
Glucose-Capillary: 116 mg/dL — ABNORMAL HIGH (ref 65–99)

## 2017-03-24 NOTE — Progress Notes (Signed)
Patient ID: Christy Sutton, female   DOB: 04-Sep-1926, 83 y.o.   MRN: [id number]   03/24/17.  Christy Sutton is a 83 y.o. female  Admit for CIR with Left sided weakness, cognitive deficits  secondary to right CVA on 6/27.   Subjective: No new complaints. No new problems. Very HOH  Past Medical History:  Diagnosis Date  . Atrial fibrillation ([place])   . Diabetes mellitus without complication ([place])   . Hyperlipidemia   . Hypertension   . TIA (transient ischemic attack)      Objective: Vital signs in last 24 hours: Temp:  [98 F (36.7 C)] 98 F (36.7 C) (07/14 0700) Pulse Rate:  [62] 62 (07/14 0700) Resp:  [17] 17 (07/14 0700) BP: (118)/(83) 118/83 (07/14 0700) SpO2:  [95 %] 95 % (07/14 0700) Weight change:  Last BM Date: 03/24/17  Intake/Output from previous day: 07/13 0701 - 07/14 0700 In: 1510 [P.O.:1510] Out: -  Last cbgs: CBG (last 3)   Recent Labs  03/22/17 1627 03/23/17 1255 03/23/17 1725  GLUCAP 111* 172* 107*   Lab Results  Component Value Date   HGBA1C 6.0 (H) 01/18/2017    BP Readings from Last 3 Encounters:  03/24/17 118/83  03/09/17 (!) 157/59  01/30/17 (!) 101/34     Physical Exam General: No apparent distress  Alert but HOH HEENT: not dry Lungs: Normal effort. Lungs clear to auscultation, no crackles or wheezes. Cardiovascular: Irregular rate and rhythm, no edema Abdomen: S/NT/ND; BS(+) Musculoskeletal:  unchanged Neurological: No new neurological deficits; L sided weakness Wounds: N/A    Skin: clear  Aging changes Mental state: Alert, oriented, cooperative    Lab Results: BMET    Component Value Date/Time   NA 141 03/22/2017 0541   K 3.4 (L) 03/22/2017 0541   CL 108 03/22/2017 0541   CO2 26 03/22/2017 0541   GLUCOSE 107 (H) 03/22/2017 0541   BUN 14 03/22/2017 0541   CREATININE 0.59 03/22/2017 0541   CALCIUM 8.8 (L) 03/22/2017 0541   GFRNONAA >60 03/22/2017 0541   GFRAA >60 03/22/2017 0541   CBC    Component Value Date/Time   WBC 5.4 03/22/2017 0541   RBC 3.86 (L) 03/22/2017 0541   HGB 11.6 (L) 03/22/2017 0541   HCT 35.1 (L) 03/22/2017 0541   PLT 157 03/22/2017 0541   MCV 90.9 03/22/2017 0541   MCH 30.1 03/22/2017 0541   MCHC 33.0 03/22/2017 0541   RDW 13.8 03/22/2017 0541   LYMPHSABS 0.9 03/10/2017 0404   MONOABS 0.6 03/10/2017 0404   EOSABS 0.4 03/10/2017 0404   BASOSABS 0.0 03/10/2017 0404    Medications: I have reviewed the patient's current medications.  Assessment/Plan:  Left sided weakness, cognitive deficits secondary to right CVA on 6/27. DM- well controlled HTN- controlled HOH     Length of stay, days: [place] , MD 03/24/2017, 11:01 AM

## 2017-03-25 LAB — GLUCOSE, CAPILLARY
GLUCOSE-CAPILLARY: 131 mg/dL — AB (ref 65–99)
Glucose-Capillary: 158 mg/dL — ABNORMAL HIGH (ref 65–99)

## 2017-03-25 NOTE — Progress Notes (Signed)
Patient ID: Christy Sutton, female   DOB: 03/06/1926, 83 y.o.   MRN: [id number]   Christy Sutton is a 83 y.o. female   Who is admitted for CIR with functional deficits secondary to right CVA with residual left-sided weakness and some cognitive deficits.  Subjective: No new complaints. No new problems.  Sitting up in a chair today and feeding herself breakfast  Past Medical History:  Diagnosis Date  . Atrial fibrillation ([place])   . Diabetes mellitus without complication ([place])   . Hyperlipidemia   . Hypertension   . TIA (transient ischemic attack)      Objective: Vital signs in last 24 hours: Temp:  [97.7 F (36.5 C)-98.2 F (36.8 C)] 97.7 F (36.5 C) (07/15 0518) Pulse Rate:  [45-67] 67 (07/15 0518) Resp:  [16-18] 18 (07/15 0518) BP: (115-138)/(41-72) 138/72 (07/15 0518) SpO2:  [99 %-100 %] 99 % (07/15 0518) Weight change:  Last BM Date: 03/24/17  Intake/Output from previous day: 07/14 0701 - 07/15 0700 In: 400 [P.O.:400] Out: 2 [Urine:1; Stool:1] Last cbgs: CBG (last 3)   Recent Labs  03/24/17 1209 03/24/17 1711 03/24/17 2123  GLUCAP 116* 94 201*   Lab Results  Component Value Date   HGBA1C 6.0 (H) 01/18/2017    BP Readings from Last 3 Encounters:  03/25/17 138/72  03/09/17 (!) 157/59  01/30/17 (!) 101/34    Physical Exam General: No apparent distress  .  Hard of hearing HEENT: Well hydrated Lungs: Normal effort. Lungs clear to auscultation, no crackles or wheezes. Cardiovascular: Irregular rate and rhythm, no edema Abdomen: S/NT/ND; BS(+) Musculoskeletal:  unchanged Neurological: No new neurological deficits with left-sided weakness Wounds: N/A    Skin: clear   Mental state: Alert, oriented, cooperative    Lab Results: BMET    Component Value Date/Time   NA 141 03/22/2017 0541   K 3.4 (L) 03/22/2017 0541   CL 108 03/22/2017 0541   CO2 26 03/22/2017 0541   GLUCOSE 107 (H) 03/22/2017 0541   BUN 14 03/22/2017 0541   CREATININE 0.59 03/22/2017 0541    CALCIUM 8.8 (L) 03/22/2017 0541   GFRNONAA >60 03/22/2017 0541   GFRAA >60 03/22/2017 0541   CBC    Component Value Date/Time   WBC 5.4 03/22/2017 0541   RBC 3.86 (L) 03/22/2017 0541   HGB 11.6 (L) 03/22/2017 0541   HCT 35.1 (L) 03/22/2017 0541   PLT 157 03/22/2017 0541   MCV 90.9 03/22/2017 0541   MCH 30.1 03/22/2017 0541   MCHC 33.0 03/22/2017 0541   RDW 13.8 03/22/2017 0541   LYMPHSABS 0.9 03/10/2017 0404   MONOABS 0.6 03/10/2017 0404   EOSABS 0.4 03/10/2017 0404   BASOSABS 0.0 03/10/2017 0404    Medications: I have reviewed the patient's current medications.  Assessment/Plan:  Functional and cognitive deficits secondary to right CVA with left-sided weakness.  Continue CIR Diabetes mellitus, controlled Hypertension, well-controlled Hard  of hearing    Length of stay, days: [place] , MD 03/25/2017, 10:54 AM

## 2017-03-26 ENCOUNTER — Inpatient Hospital Stay (HOSPITAL_COMMUNITY): Payer: Medicare Other | Admitting: Physical Therapy

## 2017-03-26 ENCOUNTER — Inpatient Hospital Stay (HOSPITAL_COMMUNITY): Payer: Medicare Other | Admitting: Occupational Therapy

## 2017-03-26 ENCOUNTER — Inpatient Hospital Stay (HOSPITAL_COMMUNITY): Payer: Medicare Other

## 2017-03-26 LAB — URINALYSIS, ROUTINE W REFLEX MICROSCOPIC
BILIRUBIN URINE: NEGATIVE
Bacteria, UA: NONE SEEN
GLUCOSE, UA: NEGATIVE mg/dL
HGB URINE DIPSTICK: NEGATIVE
Ketones, ur: NEGATIVE mg/dL
NITRITE: NEGATIVE
PH: 5 (ref 5.0–8.0)
Protein, ur: NEGATIVE mg/dL
SPECIFIC GRAVITY, URINE: 1.015 (ref 1.005–1.030)

## 2017-03-26 LAB — COMPREHENSIVE METABOLIC PANEL
ALK PHOS: 45 U/L (ref 38–126)
ALT: 15 U/L (ref 14–54)
ANION GAP: 8 (ref 5–15)
AST: 32 U/L (ref 15–41)
Albumin: 3.4 g/dL — ABNORMAL LOW (ref 3.5–5.0)
BILIRUBIN TOTAL: 1.1 mg/dL (ref 0.3–1.2)
BUN: 16 mg/dL (ref 6–20)
CALCIUM: 9.1 mg/dL (ref 8.9–10.3)
CO2: 25 mmol/L (ref 22–32)
Chloride: 106 mmol/L (ref 101–111)
Creatinine, Ser: 0.82 mg/dL (ref 0.44–1.00)
GFR calc non Af Amer: >60 mL/min (ref >60)
Glucose, Bld: 206 mg/dL — ABNORMAL HIGH (ref 65–99)
Potassium: 4.8 mmol/L (ref 3.5–5.1)
SODIUM: 139 mmol/L (ref 135–145)
TOTAL PROTEIN: 6.2 g/dL — AB (ref 6.5–8.1)

## 2017-03-26 LAB — CBC WITH DIFFERENTIAL/PLATELET
BASOS ABS: 0 10*3/uL (ref 0.0–0.1)
BASOS PCT: 0 %
EOS ABS: 0.3 10*3/uL (ref 0.0–0.7)
Eosinophils Relative: 4 %
HCT: 37.4 % (ref 36.0–46.0)
HEMOGLOBIN: 12.3 g/dL (ref 12.0–15.0)
Lymphocytes Relative: 11 %
Lymphs Abs: 1 10*3/uL (ref 0.7–4.0)
MCH: 30 pg (ref 26.0–34.0)
MCHC: 32.9 g/dL (ref 30.0–36.0)
MCV: 91.2 fL (ref 78.0–100.0)
MONO ABS: 0.6 10*3/uL (ref 0.1–1.0)
MONOS PCT: 7 %
NEUTROS ABS: 6.7 10*3/uL (ref 1.7–7.7)
NEUTROS PCT: 78 %
Platelets: 166 10*3/uL (ref 150–400)
RBC: 4.1 MIL/uL (ref 3.87–5.11)
RDW: 14 % (ref 11.5–15.5)
WBC: 8.6 10*3/uL (ref 4.0–10.5)

## 2017-03-26 LAB — GLUCOSE, CAPILLARY
GLUCOSE-CAPILLARY: 181 mg/dL — AB (ref 65–99)
GLUCOSE-CAPILLARY: 88 mg/dL (ref 65–99)
GLUCOSE-CAPILLARY: 123 mg/dL — AB (ref 65–99)

## 2017-03-26 NOTE — Progress Notes (Signed)
Occupational Therapy Note  Patient Details  Name: Christy Sutton MRN: [id number] Date of Birth: 1926/09/07  Today's Date: 03/26/2017 OT Individual Time: 1400-1430 OT Individual Time Calculation (min): 30 min   Pt denies pain Individual Therapy  Pt resting in w/c upon arrival and agreeable to participating in therapy.  Pt amb with RW to dayroom with min A and max verbal cues for safety and to pick up LLE/foot adequately for clearance.  Pt engaged in sit<>stand placing BUE/hands on knees vs pushing up from arm rests.  Pt initially completed task at supervision level but required min A with fatigue.  Pt propelled w/c back to room and transferred to bed.  Pt remained in bed with all needs within reach and bed alarm activated.    Leotis Shames [facility] 03/26/2017, 2:43 PM

## 2017-03-26 NOTE — Progress Notes (Signed)
Physical Therapy Session Note  Patient Details  Name: Christy Sutton MRN: [id number] Date of Birth: 1926-03-19  Today's Date: 03/26/2017 PT Individual Time: 0915-1030 and 1320-1330 PT Individual Time Calculation (min): 75 min and 10 min (total 85 min)   Short Term Goals: Week 3:  PT Short Term Goal 1 (Week 3): =LTG due to estimated LOS  Skilled Therapeutic Interventions/Progress Updates: Tx 1: Pt received seated in w/c with family present, denies pain and agreeable to treatment. W/c propulsion to/from gym with BUE and S for LUE NMR and aerobic endurance. Gait in ADL apartment with RW and min guard to locate and retrieve colored discs around the room for focus on dynamic balance and coordination; requires occasional cueing at L glute for stance control, and note poor foot clearance on carpeted surface even with use of AFO. Educated family on purpose of AFO, possibility of adding toe cap to L shoe to improve foot clearance, reduce fall risk and energy expenditure. Gait to gym x75' without AFO to assess progress of L dorisflexion and foot clearance; increased to minA with increase in incoordinated and impulsive foot placement to assist with progression; AFO replaced. Gait trial x30' with AFO and no AD; modA and tactile cueing at L glute for stance control and reduce trendelenburg. RLE, LLE and alternating toe taps to 4" step for focus on LLE stance control, LLE coordination, weight shifting and dynamic balance; maxA initially reduced to minA/min guard with repetition. Stairs x3 trials with step-to pattern; one trial with LLE lead and two trials with RLE lead. Educated pt and family in recommendation to lead ascent with stronger RLE, descend with LLE. Lateral step ups BLE to 6" step with BUE support on parallel bars; tactile cueing for eccentric control in BLE. Returned to room with w/c propulsion as above; remained seated in w/c at end of session, quick release belt intact and all needs in reach.   Tx 2: Pt  off unit for chest xray at start of therapy session; missed 20 min. Upon therapist's return pt agreeable to treatment, denies pain. Supine>sit with S using bedrails and HOB elevated. Gait trial with RW, L AFO and minA for LLE progression and stance control. Tactile cueing and facilitation at L glute med/max to reduce trendelenburg, tactile cues for upright posture and verbal cues for forward visual gaze to improve postural alignment. Remained seated in w/c at end of session, quick release belt intact and all needs in reach.      Therapy Documentation Precautions:  Precautions Precautions: Fall Precaution Comments: L hemiparesis, impulsive, HOH Restrictions Weight Bearing Restrictions: No General: PT Amount of Missed Time (min): 20 Minutes PT Missed Treatment Reason: Xray   See Function Navigator for Current Functional Status.   Therapy/Group: Individual Therapy  [name] 03/26/2017, 10:31 AM

## 2017-03-26 NOTE — Progress Notes (Signed)
Occupational Therapy Session Note  Patient Details  Name: Christy Sutton MRN: [id number] Date of Birth: 02-14-1926  Today's Date: 03/26/2017 OT Individual Time: 1700-1749 OT Individual Time Calculation (min): 62 min    Short Term Goals: Week 2:  OT Short Term Goal 1 (Week 2): Pt will be able to stand with min A or less while pulling pants over hips post toileting or during dressing. OT Short Term Goal 2 (Week 2): Pt will demonstrate improved L side awareness by positioning her L foot in safe position prior to standing.   OT Short Term Goal 3 (Week 2): Pt will be able to don socks with set up.    Skilled Therapeutic Interventions/Progress Updates:    Tx focus on balance, ADL retraining, and L UE NMR.   Pt greeted supine in bed. Daughter [name] present. Pt transitioning to EOB with Mod A and HOB flat. She completed UB/LB dressing with Mod A for correcting frequent LOBs to Lt. Assist for placing L LE figure 4 position for don footwear/AFO. Pt then ambulated to dayroom with heavy Mod A, required manual facilitation for weight shift to Rt and cues for L LE clearance. Pt with 1 experience of L LE buckling with Max A to correct balance. Once in dayroom, pt participated in purposeful activities with use of button box, focusing on FM remediation for ADL completion. Pt buttoning/unbuttoning large and small buttons, tying laces, threading string, and using zipper with L UE (assist from R UE during tasks with bilateral demands). Pt requiring extra time to complete all tasks. Pt with decreased affect today, reporting she felt like her left side was stronger last week and convinced she has had another stroke or extension of stroke. Pt provided with therapeutic listening and support to ease concerns, encouraged her to continue focusing on remediation of functional skills in therapies Pt was then escorted back to room. She was left with all needs and family at time of departure. Family verbalizing that they will stay  with her until next therapy session in 1 hour.   Therapy Documentation Precautions:  Precautions Precautions: Fall Precaution Comments: L hemiparesis, impulsive, HOH Restrictions Weight Bearing Restrictions: No Pain: No c/o pain during tx    ADL: ADL ADL Comments: Please see functional navigator for ADL status     See Function Navigator for Current Functional Status.   Therapy/Group: Individual Therapy  Lakyn Mantione A Ayham Word 03/26/2017, 12:24 PM

## 2017-03-26 NOTE — Progress Notes (Addendum)
[place] PHYSICAL MEDICINE & REHABILITATION     PROGRESS NOTE  Subjective/Complaints:  States she had a "TIA" over weekend, Friday. Feels that left side is weaker and heavier than before. Has had intermittent heaviness and numbness.   ROS: pt denies nausea, vomiting, diarrhea, cough, shortness of breath or chest pain   Objective: Vital Signs: Blood pressure 139/68, pulse 86, temperature 98.1 F (36.7 C), temperature source Oral, resp. rate 18, weight 58.7 kg (129 lb 6.6 oz), SpO2 98 %. No results found. No results for input(s): WBC, HGB, HCT, PLT in the last 72 hours. No results for input(s): NA, K, CL, GLUCOSE, BUN, CREATININE, CALCIUM in the last 72 hours.  Invalid input(s): CO CBG (last 3)   Recent Labs  03/24/17 2123 03/25/17 1149 03/25/17 1715  GLUCAP 201* 131* 158*    Wt Readings from Last 3 Encounters:  03/22/17 58.7 kg (129 lb 6.6 oz)  03/07/17 64.9 kg (143 lb)  01/18/17 67.6 kg (149 lb 0.5 oz)    Physical Exam:  BP 139/68 (BP Location: Right Arm)   Pulse 86   Temp 98.1 F (36.7 C) (Oral)   Resp 18   Wt 58.7 kg (129 lb 6.6 oz)   SpO2 98%   BMI 22.21 kg/m  Constitutional: She appears well-developed and well-nourished.  HENT: Normocephalic and atraumatic.  Eyes: EOM are normal. No discharge.   Cardiovascular: RRR without murmur. No JVD     Respiratory: CTA Bilaterally without wheezes or rales. Normal effort   GI: Soft. Bowel sounds are normal.   Musculoskeletal: She exhibits no edema or tenderness.  Neurological: She is alert and oriented.  Extremely HOH.  Minimal dysarthria.  Motor: RUE/RLE: 5/5 proximal to distal LUE: 4+/5 with improved fmc LLE: HF 2+ to 3-/5, KE 3/5, ADF/PF 3+/5 (sl decrease from Friday) ?decreased LT left arm and leg Skin: Skin is warm and dry.  Psychiatric: She has a normal mood and affect. Her behavior is normal. Thought content normal.    Assessment/Plan: 1. Functional deficits secondary to right CVA which require 3+ hours  per day of interdisciplinary therapy in a comprehensive inpatient rehab setting. Physiatrist is providing close team supervision and 24 hour management of active medical problems listed below. Physiatrist and rehab team continue to assess barriers to discharge/monitor patient progress toward functional and medical goals.  Function:  Bathing Bathing position Bathing activity did not occur: Refused Position: Wheelchair/chair at sink  Bathing parts Body parts bathed by patient: Left arm, Chest, Abdomen, Front perineal area, Right arm, Buttocks, Right upper leg, Left upper leg, Right lower leg, Left lower leg Body parts bathed by helper: Back  Bathing assist Assist Level: Touching or steadying assistance(Pt > 75%)      Upper Body Dressing/Undressing Upper body dressing   What is the patient wearing?: Pull over shirt/dress, Bra Bra - Perfomed by patient: Thread/unthread right bra strap, Thread/unthread left bra strap, Hook/unhook bra (pull down sports bra) Bra - Perfomed by helper: Hook/unhook bra (pull down sports bra) Pull over shirt/dress - Perfomed by patient: Thread/unthread right sleeve, Thread/unthread left sleeve, Put head through opening, Pull shirt over trunk Pull over shirt/dress - Perfomed by helper: Pull shirt over trunk        Upper body assist Assist Level: Set up Assistive Device Comment: button hook Set up : To obtain clothing/put away  Lower Body Dressing/Undressing Lower body dressing   What is the patient wearing?: Socks, Shoes, Underwear, Pants Underwear - Performed by patient: Thread/unthread left underwear leg,  Thread/unthread right underwear leg, Pull underwear up/down Underwear - Performed by helper: Thread/unthread right underwear leg, Pull underwear up/down Pants- Performed by patient: Thread/unthread left pants leg, Thread/unthread right pants leg, Pull pants up/down Pants- Performed by helper: Thread/unthread right pants leg Non-skid slipper socks- Performed  by patient: Don/doff right sock, Don/doff left sock Non-skid slipper socks- Performed by helper: Don/doff left sock, Don/doff right sock Socks - Performed by patient: Don/doff right sock Socks - Performed by helper: Don/doff left sock Shoes - Performed by patient: Don/doff right shoe Shoes - Performed by helper: Don/doff left shoe, Fasten right, Fasten left          Lower body assist Assist for lower body dressing: Touching or steadying assistance (Pt > 75%)      Toileting Toileting Toileting activity did not occur: No continent bowel/bladder event Toileting steps completed by patient: Adjust clothing prior to toileting, Performs perineal hygiene, Adjust clothing after toileting Toileting steps completed by helper: Adjust clothing prior to toileting Toileting Assistive Devices: Grab bar or rail  Toileting assist Assist level: Touching or steadying assistance (Pt.75%)   Transfers Chair/bed transfer   Chair/bed transfer method: Stand pivot Chair/bed transfer assist level: Touching or steadying assistance (Pt > 75%) Chair/bed transfer assistive device: Armrests     Locomotion Ambulation     Max distance: 75 Assist level: Touching or steadying assistance (Pt > 75%)   Wheelchair   Type: Manual Max wheelchair distance: 150 Assist Level: Supervision or verbal cues  Cognition Comprehension Comprehension assist level: Follows basic conversation/direction with no assist  Expression Expression assist level: Expresses basic needs/ideas: With no assist  Social Interaction Social Interaction assist level: Interacts appropriately with others with medication or extra time (anti-anxiety, antidepressant).  Problem Solving Problem solving assist level: Solves basic 75 - 89% of the time/requires cueing 10 - 24% of the time  Memory Memory assist level: Recognizes or recalls 75 - 89% of the time/requires cueing 10 - 24% of the time    Medical Problem List and Plan: 1.   Left sided weakness,  cognitive deficits with no safety awareness and hard to redirect secondary to right CVA on 6/27.  Continue CIR therapies  -I explained to patient that her stroke can cause sensory symptoms on the left side which might seem to "fluctuate"  -nevertheless, we have contacted neurology regarding increased weakness in LLE which appears real. Limited MRI ordered.   -I spoke to family regarding potential plan also 2.  DVT Prophylaxis/Anticoagulation: Pharmaceutical: Pradexa 3. Pain Management: tylenol prn 4. Mood: LCSW to follow for evaluation and support 5. Neuropsych: This patient is capable of making decisions on her own behalf. 6. Skin/Wound Care: routine pressure relief measures.  7. Fluids/Electrolytes/Nutrition: Monitor I/O  -labs all reviewed.  8. Anemia:   Hb 11.6 7/12  Cont to monitor 9. DM diet controlled: Hgb A1c- 6.0.  BS stable overall will check BS bid with SSI as needed.  -CM diet  -reasonable control 10. HTN: monitor BP bid and avoid hypotension.  Continue atenolol daily.    -good control 11. Hypokalemia  -K+ 3.4 7/12    -continue supp    LOS (Days) 17 A FACE TO FACE EVALUATION WAS PERFORMED  Dannah Ryles T 03/26/2017 8:36 AM

## 2017-03-26 NOTE — Plan of Care (Signed)
Talked with Reesa Chew, PA, about pt left leg mildly worsening weakness and intermitted numbness of left leg. Pt is on pradaxa and ASA. Not sure if this is new stroke or recrudescence from previous stroke. Will do limited MRI as well as metabolic work up including UA, CXR, BMP and CBC. Will review images once finished.   Rosalin Hawking, MD PhD Stroke Neurology 03/26/2017 12:15 PM

## 2017-03-27 ENCOUNTER — Inpatient Hospital Stay (HOSPITAL_COMMUNITY): Payer: Medicare Other | Admitting: Physical Therapy

## 2017-03-27 ENCOUNTER — Inpatient Hospital Stay (HOSPITAL_COMMUNITY): Payer: Medicare Other | Admitting: Occupational Therapy

## 2017-03-27 ENCOUNTER — Inpatient Hospital Stay (HOSPITAL_COMMUNITY): Payer: Medicare Other

## 2017-03-27 LAB — GLUCOSE, CAPILLARY
GLUCOSE-CAPILLARY: 120 mg/dL — AB (ref 65–99)
GLUCOSE-CAPILLARY: 112 mg/dL — AB (ref 65–99)
Glucose-Capillary: 120 mg/dL — ABNORMAL HIGH (ref 65–99)

## 2017-03-27 MED ORDER — ALPRAZOLAM 0.25 MG PO TABS
0.2500 mg | ORAL_TABLET | ORAL | Status: AC
  Administered 2017-03-27: 0.25 mg via ORAL
  Filled 2017-03-27: qty 1

## 2017-03-27 NOTE — Patient Care Conference (Signed)
Inpatient RehabilitationTeam Conference and Plan of Care Update Date: 03/27/2017   Time: 2:15 PM    Patient Name: Christy Sutton      Medical Record Number: [id number]  Date of Birth: 07-Jul-1926 Sex: Female         Room/Bed: 4W05C/4W05C-01 Payor Info: Payor: MEDICARE / Plan: MEDICARE PART A AND B / Product Type: *No Product type* /    Admitting Diagnosis: CVA  Admit Date/Time:  03/09/2017  4:24 PM Admission Comments: No comment available   Primary Diagnosis:  <principal problem not specified> Principal Problem: <principal problem not specified>  Patient Active Problem List   Diagnosis Date Noted  . Left hemiparesis ([place])   . Acute blood loss anemia   . Benign essential HTN   . Hypokalemia   . Hypoalbuminemia due to protein-calorie malnutrition ([place])   . Stroke due to embolism ([place]) 03/09/2017  . Diabetes mellitus type 2 in nonobese (H[facility])   . History of CVA (cerebrovascular accident)   . Cerebral embolism with cerebral infarction 03/07/2017  . Stroke ([place]) 01/17/2017  . TIA (transient ischemic attack) 01/17/2017  . Dysmetria 01/17/2017  . Essential hypertension 01/17/2017  . Diabetes mellitus with complication ([place]) 10/62/6948  . Permanent atrial fibrillation ([place]) 01/17/2017  . Ischemic stroke [facility])     Expected Discharge Date: Expected Discharge Date: 03/31/17  Team Members Present: Physician leading conference: Dr. Alger Simons Social Worker Present: Alfonse Alpers, LCSW Nurse Present: Heather Roberts, RN PT Present: Kem Parkinson, PT OT Present: Roanna Epley, [place], OT SLP Present: Weston Anna, SLP PPS Coordinator present : Daiva Nakayama, RN, CRRN     Current Status/Progress Goal Weekly Team Focus  Medical   new weakness LLE, sensory changes---follow up work up under way  improve use of left leg  hr, anticoagulation, see above   Bowel/Bladder   Pt is cont x 2  pt will remain cont x2 during this admission  pending UA and urine cx, treat if needed    Swallow/Nutrition/ Hydration             ADL's   improved standing balance to min A with progressing L side awareness so pt can complete self care at a steadying A level, close to meeting LTGs  min A overall  ADL training, standing balance, postural control, pt/family education   Mobility   S bed mobility, min guard transfers and gait with RW and AFO, minA stairs  S bed mobility and transfers, minA gait in home and stairs for strengthening  L NMR, transfer and gait training, need family hands on training prior to d/c   Communication             Safety/Cognition/ Behavioral Observations  pt is alert and oriented, pt understands to use call bell for assistants  Pt will remain free of falls during this admission  to maintain safety   Pain   Pt has been free of pain  Maintain control of pain level during this admission  pt will maintain a pain level of less than 4   Skin   Pt has generalized bruising, pt currently on aspirin and pradaxa  Pt will remain free of new skin tear or breakdown during this admission  Pt will remain free of new skin tears or breakdown    Rehab Goals Patient on target to meet rehab goals: Yes Rehab Goals Revised: none *See Care Plan and progress notes for long and short-term goals.     Barriers to Discharge  Current Status/Progress Possible  Resolutions Date Resolved   Physician    Medical stability        repeat MRI/neurology consult      Nursing                  PT                    OT                  SLP                SW                Discharge Planning/Teaching Needs:  Pt will return home with her family and they will arrange 24/7 care.  Family has been coming to therapy for family education and will continue to do so upon d/c.   Team Discussion:  Pt awaiting neuro workup due to pt feeling her left leg is different and she feels she has had another stroke.  Dr. Naaman Plummer talked to pt's son to explain pt's condition and that she is on a blood  thinner to try to prevent future events.  Pt is steadily progressing and needs a L AFO.  Family education for PT and OT requested and CSW to arrange.  Pt is impulsive at times and doesn't slow down during ADL tasks, especially toileting.  Pt is continent and nursing has completed stroke prevention education with pt and family.  Revisions to Treatment Plan:  none    Continued Need for Acute Rehabilitation Level of Care: The patient requires daily medical management by a physician with specialized training in physical medicine and rehabilitation for the following conditions: Daily direction of a multidisciplinary physical rehabilitation program to ensure safe treatment while eliciting the highest outcome that is of practical value to the patient.: Yes Daily medical management of patient stability for increased activity during participation in an intensive rehabilitation regime.: Yes Daily analysis of laboratory values and/or radiology reports with any subsequent need for medication adjustment of medical intervention for : Neurological problems  Esraa Seres, [name] 03/27/2017, 2:39 PM

## 2017-03-27 NOTE — Patient Care Conference (Signed)
Inpatient RehabilitationTeam Conference and Plan of Care Update Date: 03/27/2017   Time: 2:38 AM    Patient Name: Christy Sutton      Medical Record Number: [id number]  Date of Birth: Feb 10, 1926 Sex: Female         Room/Bed: 4W05C/4W05C-01 Payor Info: Payor: MEDICARE / Plan: MEDICARE PART A AND B / Product Type: *No Product type* /    Admitting Diagnosis: CVA  Admit Date/Time:  03/09/2017  4:24 PM Admission Comments: No comment available   Primary Diagnosis:  <principal problem not specified> Principal Problem: <principal problem not specified>  Patient Active Problem List   Diagnosis Date Noted  . Left hemiparesis ([place])   . Acute blood loss anemia   . Benign essential HTN   . Hypokalemia   . Hypoalbuminemia due to protein-calorie malnutrition ([place])   . Stroke due to embolism ([place]) 03/09/2017  . Diabetes mellitus type 2 in nonobese (HCC)   . History of CVA (cerebrovascular accident)   . Cerebral embolism with cerebral infarction 03/07/2017  . Stroke ([place]) 01/17/2017  . TIA (transient ischemic attack) 01/17/2017  . Dysmetria 01/17/2017  . Essential hypertension 01/17/2017  . Diabetes mellitus with complication ([place]) 97/67/3419  . Permanent atrial fibrillation ([place]) 01/17/2017  . Ischemic stroke [facility])     Expected Discharge Date: Expected Discharge Date: 03/31/17  Team Members Present:       Current Status/Progress Goal Weekly Team Focus  Medical   new weakness LLE, sensory changes---follow up work up under way  improve use of left leg  hr, anticoagulation, see above   Bowel/Bladder             Swallow/Nutrition/ Hydration             ADL's             Mobility             Communication             Safety/Cognition/ Behavioral Observations            Pain             Skin              Rehab Goals Patient on target to meet rehab goals: Yes Rehab Goals Revised: none *See Care Plan and progress notes for long and short-term goals.     Barriers to  Discharge  Current Status/Progress Possible Resolutions Date Resolved   Physician    Medical stability        repeat MRI/neurology consult      Nursing                    PT                     OT                   SLP                SW                Discharge Planning/Teaching Needs:  Pt will return home with her family and they will arrange 24/7 care.  Family has been coming to therapy for family education and will continue to do so upon d/c.   Team Discussion:    Revisions to Treatment Plan:      Continued Need for Acute Rehabilitation Level of Care: The patient requires daily medical management by a  physician with specialized training in physical medicine and rehabilitation for the following conditions: Daily direction of a multidisciplinary physical rehabilitation program to ensure safe treatment while eliciting the highest outcome that is of practical value to the patient.: Yes Daily medical management of patient stability for increased activity during participation in an intensive rehabilitation regime.: Yes Daily analysis of laboratory values and/or radiology reports with any subsequent need for medication adjustment of medical intervention for : Neurological problems  Arville Go 03/27/2017, 2:38 AM

## 2017-03-27 NOTE — Progress Notes (Signed)
Occupational Therapy Session Note  Patient Details  Name: Christy Sutton MRN: [id number] Date of Birth: 1925/11/10  Today's Date: 03/27/2017 OT Individual Time: 1452-1540 OT Individual Time Calculation (min): 48 min    Short Term Goals: Week 2:  OT Short Term Goal 1 (Week 2): Pt will be able to stand with min A or less while pulling pants over hips post toileting or during dressing. OT Short Term Goal 1 - Progress (Week 2): Met OT Short Term Goal 2 (Week 2): Pt will demonstrate improved L side awareness by positioning her L foot in safe position prior to standing.   OT Short Term Goal 2 - Progress (Week 2): Met OT Short Term Goal 3 (Week 2): Pt will be able to don socks with set up.   OT Short Term Goal 3 - Progress (Week 2): Met  Skilled Therapeutic Interventions/Progress Updates:    Pt received sitting up in w/c ready for OT tx session. TotalA to rehab apartment kitchen via w/c where Pt completes stand pivot transfers x3 times using RW with MinA to complete dynamic standing activity at kitchen counter. Pt stood to wash hands, cut and place cornbread on plate (which Pt had prepared in previous session), and stood at sink to wash dishes. Pt requires minGuard during standing to wash hands and cut cornbread, required minA during standing to wash dishes as Pt with increased fatigue. Pt transported back to room in manner described above where Pt completed stand pivot w/c<>toilet with minA, MinA for clothing management during toileting. Pt completed stand pivot w/c>EOB and EOB to supine with minA, Pt able to scoot self to [facility] using bedrails. Pt left supine in bed, bed alarm activated, call bell and needs within reach.   Therapy Documentation Precautions:  Precautions Precautions: Fall Precaution Comments: L hemiparesis, impulsive, HOH Restrictions Weight Bearing Restrictions: No   Pain: Pain Assessment Pain Assessment: No/denies pain ADL: ADL ADL Comments: Please see functional navigator  for ADL status  See Function Navigator for Current Functional Status.   Therapy/Group: Individual Therapy  Raymondo Band 03/27/2017, 4:52 PM

## 2017-03-27 NOTE — Progress Notes (Signed)
Recreational Therapy Session Note  Patient Details  Name: Christy Sutton MRN: [id number] Date of Birth: Jan 30, 1926 Today's Date: 03/27/2017  Pain: no c/o Skilled Therapeutic Interventions/Progress Updates: Session focused on activity tolerance, dynamic standing balance, functional mobility using RW in kitchen, safety awareness, visual scanning, & problem solving during meal prep task-making cornbread.  Pt stood and ambulated throughout kitchen obtaining items from cabinet with close supervision-contact guard assist, mod cues for safety & posture throughout.    Therapy/Group: Co-Treatment  Arias Weinert 03/27/2017, 2:47 PM

## 2017-03-27 NOTE — Progress Notes (Signed)
Occupational Therapy Session Note  Patient Details  Name: Christy Sutton MRN: [id number] Date of Birth: 1926/01/02  Today's Date: 03/27/2017 OT Individual Time: 0123-9359 OT Individual Time Calculation (min): 30 min    Short Term Goals: Week 2:  OT Short Term Goal 1 (Week 2): Pt will be able to stand with min A or less while pulling pants over hips post toileting or during dressing. OT Short Term Goal 1 - Progress (Week 2): Met OT Short Term Goal 2 (Week 2): Pt will demonstrate improved L side awareness by positioning her L foot in safe position prior to standing.   OT Short Term Goal 2 - Progress (Week 2): Met OT Short Term Goal 3 (Week 2): Pt will be able to don socks with set up.   OT Short Term Goal 3 - Progress (Week 2): Met Week 3:  OT Short Term Goal 1 (Week 3): STGs = LTGs  Skilled Therapeutic Interventions/Progress Updates:    1:1 Therapetuic activity: Focus on sit to stands with forward weight shifts and controlled decent with stand to sit with out UE support at midline. Also focus on weight shift on to left LE with sustained hip and knee terminal extension with toe tapping on colored pad on the floor- required max cuing for upright trunk posture and and hip extension and activation in left LE. Pt able to self propel with bilateral feet with min A for maintaining movement and cuing for attention to left LE (to promote activation in LE from hip to toe). Left in w/c with quick release belt with call bell.   Therapy Documentation Precautions:  Precautions Precautions: Fall Precaution Comments: L hemiparesis, impulsive, HOH Restrictions Weight Bearing Restrictions: No Pain:  no c/o pain  ADL: ADL ADL Comments: Please see functional navigator for ADL status  See Function Navigator for Current Functional Status.   Therapy/Group: Individual Therapy  [name] [name] 03/27/2017, 3:35 PM

## 2017-03-27 NOTE — Progress Notes (Signed)
Occupational Therapy Weekly Progress Note  Patient Details  Name: Christy Sutton MRN: [id number] Date of Birth: 08/13/1926  Beginning of progress report period: March 19, 2017 End of progress report period: March 27, 2017  Today's Date: 03/27/2017 OT Individual Time: 0930-1030 OT Individual Time Calculation (min): 60 min    Patient has met 3 of 3 short term goals.  Pt is progressing well. She is now able to complete her basic self care and ADL transfers at a steadying A level vs a min/mod A level last week. To increase safety for home, pt will need to continue to work on standing balance/transfers as she is still at a very high risk for falls.  She is now using her LUE as an active assist.   Patient continues to demonstrate the following deficits: decreased coordination, decreased safety awareness and decreased standing balance, hemiplegia and decreased balance strategies and therefore will continue to benefit from skilled OT intervention to enhance overall performance with BADL.  Patient progressing toward long term goals..  Continue plan of care.  OT Short Term Goals Week 1:  OT Short Term Goal 1 (Week 1): Pt will complete 2/3 components of LB dressing in supported seated position  OT Short Term Goal 1 - Progress (Week 1): Met OT Short Term Goal 2 (Week 1): Pt will complete toilet or BSC transfer with Mod A OT Short Term Goal 2 - Progress (Week 1): Met OT Short Term Goal 3 (Week 1): Pt will complete shower transfer with Max A and LRAD OT Short Term Goal 3 - Progress (Week 1): Met OT Short Term Goal 4 (Week 1): Pt will don shirt with supervision  OT Short Term Goal 4 - Progress (Week 1): Met Week 2:  OT Short Term Goal 1 (Week 2): Pt will be able to stand with min A or less while pulling pants over hips post toileting or during dressing. OT Short Term Goal 1 - Progress (Week 2): Met OT Short Term Goal 2 (Week 2): Pt will demonstrate improved L side awareness by positioning her L foot in safe  position prior to standing.   OT Short Term Goal 2 - Progress (Week 2): Met OT Short Term Goal 3 (Week 2): Pt will be able to don socks with set up.   OT Short Term Goal 3 - Progress (Week 2): Met Week 3:  OT Short Term Goal 1 (Week 3): STGs = LTGs  Skilled Therapeutic Interventions/Progress Updates:    Pt seen for ADL training with a focus on dynamic balance and L side awareness with foot placement.  Pt received in bed, sat to EOB with min A. Declined shower or even bathing legs. Pt needed to toilet.  Ambulated to bathroom with RW with min A.  Pt demonstrated improved standing balance during clothing management but did have 2 LOB to her L with good reaction time to reach for the grab bar.  Pt did have her L foot in good placement but continues to have significant L leg weakness.  Pt ambulated back to w/c to groom and wash UB at sink.  Donned clothing demonstrating improving L hand coordination with donning socks.   Pt taken to gym and worked on transfers, sit >< stand and standing balance with alternating arm reaches overhead. Pt returned to room, quick release belt on w/c with all needs met.  Therapy Documentation Precautions:  Precautions Precautions: Fall Precaution Comments: L hemiparesis, impulsive, HOH Restrictions Weight Bearing Restrictions: No   Pain: Pain Assessment  Pain Assessment: No/denies pain Pain Score: 0-No pain ADL: ADL ADL Comments: Please see functional navigator for ADL status  See Function Navigator for Current Functional Status.   Therapy/Group: Individual Therapy  [place] 03/27/2017, 12:23 PM

## 2017-03-27 NOTE — Progress Notes (Signed)
[facility] PHYSICAL MEDICINE & REHABILITATION     PROGRESS NOTE  Subjective/Complaints:  No new issues today. Still have my "trick" leg. Good appetite. MRI not done yet  ROS: pt denies nausea, vomiting, diarrhea, cough, shortness of breath or chest pain    Objective: Vital Signs: Blood pressure (!) 135/59, pulse 73, temperature 97.9 F (36.6 C), temperature source Oral, resp. rate 16, weight 58.7 kg (129 lb 6.6 oz), SpO2 99 %. Dg Chest 2 View  Result Date: 03/26/2017 CLINICAL DATA:  Generalized weakness. EXAM: CHEST  2 VIEW COMPARISON:  10/18/2009 . FINDINGS: Mediastinum hilar structures normal. Cardiomegaly mild pulmonary vascular prominence. Mild subsegmental atelectasis left lung base. No pleural effusion or pneumothorax IMPRESSION: 1. Cardiomegaly with mild pulmonary vascular prominence. 2.  Mild subsegmental atelectasis left lung base . Electronically Signed   By: Marcello Moores  Register   On: 03/26/2017 13:24    Recent Labs  03/26/17 1236  WBC 8.6  HGB 12.3  HCT 37.4  PLT 166    Recent Labs  03/26/17 1236  NA 139  K 4.8  CL 106  GLUCOSE 206*  BUN 16  CREATININE 0.82  CALCIUM 9.1   CBG (last 3)   Recent Labs  03/26/17 1636 03/26/17 2102 03/27/17 0649  GLUCAP 88 123* 120*    Wt Readings from Last 3 Encounters:  03/22/17 58.7 kg (129 lb 6.6 oz)  03/07/17 64.9 kg (143 lb)  01/18/17 67.6 kg (149 lb 0.5 oz)    Physical Exam:  BP (!) 135/59 (BP Location: Left Arm)   Pulse 73   Temp 97.9 F (36.6 C) (Oral)   Resp 16   Wt 58.7 kg (129 lb 6.6 oz)   SpO2 99%   BMI 22.21 kg/m  Constitutional: She appears well-developed and well-nourished.  HENT: Normocephalic and atraumatic.  Eyes: EOM are normal. No discharge.   Cardiovascular: RRR without murmur. No JVD     Respiratory: CTA Bilaterally without wheezes or rales. Normal effort  GI: Soft. Bowel sounds are normal.   Musculoskeletal: She exhibits no edema or tenderness.  Neurological: She is alert and oriented.   Extremely HOH.  Minimal dysarthria.  Motor: RUE/RLE: 5/5 proximal to distal LUE: 4+/5 with improved fmc LLE: HF 2+ to 3-/5, KE 3/5, ADF/PF 3+/5 (no change today) ?decreased LT left arm and leg Skin: Skin is warm and dry.  Psychiatric: She has a normal mood and affect. Her behavior is normal. Thought content normal.    Assessment/Plan: 1. Functional deficits secondary to right CVA which require 3+ hours per day of interdisciplinary therapy in a comprehensive inpatient rehab setting. Physiatrist is providing close team supervision and 24 hour management of active medical problems listed below. Physiatrist and rehab team continue to assess barriers to discharge/monitor patient progress toward functional and medical goals.  Function:  Bathing Bathing position Bathing activity did not occur: Refused Position: Wheelchair/chair at sink  Bathing parts Body parts bathed by patient: Left arm, Chest, Abdomen, Front perineal area, Right arm, Buttocks, Right upper leg, Left upper leg, Right lower leg, Left lower leg Body parts bathed by helper: Back  Bathing assist Assist Level: Touching or steadying assistance(Pt > 75%)      Upper Body Dressing/Undressing Upper body dressing   What is the patient wearing?: Pull over shirt/dress Bra - Perfomed by patient: Thread/unthread right bra strap, Thread/unthread left bra strap, Hook/unhook bra (pull down sports bra) Bra - Perfomed by helper: Hook/unhook bra (pull down sports bra) Pull over shirt/dress - Perfomed by patient:  Thread/unthread right sleeve, Thread/unthread left sleeve, Put head through opening, Pull shirt over trunk Pull over shirt/dress - Perfomed by helper: Pull shirt over trunk        Upper body assist Assist Level: Set up Assistive Device Comment: button hook Set up : To obtain clothing/put away  Lower Body Dressing/Undressing Lower body dressing   What is the patient wearing?: Socks, Shoes, Pants, AFO Underwear - Performed by  patient: Thread/unthread left underwear leg, Thread/unthread right underwear leg, Pull underwear up/down Underwear - Performed by helper: Thread/unthread right underwear leg, Pull underwear up/down Pants- Performed by patient: Thread/unthread left pants leg, Thread/unthread right pants leg, Pull pants up/down Pants- Performed by helper: Thread/unthread right pants leg Non-skid slipper socks- Performed by patient: Don/doff right sock, Don/doff left sock Non-skid slipper socks- Performed by helper: Don/doff left sock, Don/doff right sock Socks - Performed by patient: Don/doff right sock Socks - Performed by helper: Don/doff left sock Shoes - Performed by patient: Don/doff right shoe Shoes - Performed by helper: Don/doff left shoe, Fasten right, Fasten left, Don/doff right shoe   AFO - Performed by helper: Don/doff left AFO      Lower body assist Assist for lower body dressing: Touching or steadying assistance (Pt > 75%)      Toileting Toileting Toileting activity did not occur: No continent bowel/bladder event Toileting steps completed by patient: Adjust clothing prior to toileting, Performs perineal hygiene, Adjust clothing after toileting Toileting steps completed by helper: Adjust clothing after toileting Toileting Assistive Devices: Grab bar or rail  Toileting assist Assist level: Touching or steadying assistance (Pt.75%)   Transfers Chair/bed transfer   Chair/bed transfer method: Ambulatory Chair/bed transfer assist level: Touching or steadying assistance (Pt > 75%) Chair/bed transfer assistive device: Bedrails     Locomotion Ambulation     Max distance: 75 Assist level: Touching or steadying assistance (Pt > 75%)   Wheelchair   Type: Manual Max wheelchair distance: 150 Assist Level: Supervision or verbal cues  Cognition Comprehension Comprehension assist level: Follows basic conversation/direction with no assist  Expression Expression assist level: Expresses basic  needs/ideas: With no assist  Social Interaction Social Interaction assist level: Interacts appropriately with others with medication or extra time (anti-anxiety, antidepressant).  Problem Solving Problem solving assist level: Solves basic 75 - 89% of the time/requires cueing 10 - 24% of the time  Memory Memory assist level: Recognizes or recalls 75 - 89% of the time/requires cueing 10 - 24% of the time    Medical Problem List and Plan: 1.   Left sided weakness, cognitive deficits with no safety awareness and hard to redirect secondary to right CVA on 6/27.  Continue CIR therapies  -follow up MRI pending  -other ordered tests negative as expected.  2.  DVT Prophylaxis/Anticoagulation: Pharmaceutical: Pradexa 3. Pain Management: tylenol prn 4. Mood: LCSW to follow for evaluation and support 5. Neuropsych: This patient is capable of making decisions on her own behalf. 6. Skin/Wound Care: routine pressure relief measures.  7. Fluids/Electrolytes/Nutrition: Monitor I/O  -labs all reviewed again today.  8. Anemia:   Hb 11.6 7/12  Cont to monitor 9. DM diet controlled: Hgb A1c- 6.0.  BS stable overall will check BS bid with SSI as needed.  -CM diet  -reasonable control 10. HTN: monitor BP bid and avoid hypotension.  Continue atenolol daily.    -good control 11. Hypokalemia  -K+ 4.8 yesterday---can back of potassium sl   -continue supp    LOS (Days) 18 A FACE TO FACE  EVALUATION WAS PERFORMED  Azaiah Licciardi T 03/27/2017 8:33 AM

## 2017-03-27 NOTE — Progress Notes (Signed)
Physical Therapy Session Note  Patient Details  Name: Christy Sutton MRN: [id number] Date of Birth: December 22, 1925  Today's Date: 03/27/2017 PT Individual Time: 1300-1400 PT Individual Time Calculation (min): 60 min   Short Term Goals: Week 3:  PT Short Term Goal 1 (Week 3): =LTG due to estimated LOS  Skilled Therapeutic Interventions/Progress Updates: Pt received seated in w/c, denies pain and agreeable to treatment. Transported to ADL apartment [place]. Dynamic gait in ADL apartment with RW and min guard to close S overall; occasional LOB to L side requiring verbal/tactile cueing to attend to LOB and correct. Performed dynamic BUE reaching into cabinets to retrieve items, mix ingredients to make cornbread. Requires max cues for memory and recall while performing task as pt unable to recall items she had added to mixing bowl <1 min previously. Seated rest breaks in low recliner, close S for sit <>stand. Performed sit >supine with minA for LLE management, supervision supine>sit. BUE w/c propulsion x75' for strengthening and endurance. Remained seated in w/c at end of session, quick release belt intact and all needs in reach.      Therapy Documentation Precautions:  Precautions Precautions: Fall Precaution Comments: L hemiparesis, impulsive, HOH Restrictions Weight Bearing Restrictions: No Pain: Pain Assessment Pain Assessment: No/denies pain Pain Score: 0-No pain   See Function Navigator for Current Functional Status.   Therapy/Group: Individual Therapy  [name] 03/27/2017, 2:19 PM

## 2017-03-27 NOTE — Plan of Care (Signed)
Problem: RH BOWEL ELIMINATION Goal: RH STG MANAGE BOWEL WITH ASSISTANCE STG Manage Bowel with min Assistance.   Outcome: Progressing  03/27/17 0607  Bowel Management Goals  STG: Pt will manage bowels with assistance 5-Supervision/set up    Problem: RH BLADDER ELIMINATION Goal: RH STG MANAGE BLADDER WITH ASSISTANCE STG Manage Bladder With mod Assistance  Outcome: Progressing  03/27/17 0607  Bladder Management Goals  STG: Pt will manage bladder with assistance 5-Supervision/set up    Problem: RH SAFETY Goal: RH STG DECREASED RISK OF FALL WITH ASSISTANCE STG Decreased Risk of Fall With min Assistance.  Outcome: Progressing  03/27/17 0607  Safety Goals  [id number] risk of fall with assistance/device 5-Supervison/set up

## 2017-03-27 NOTE — Progress Notes (Signed)
Social Work Patient ID: Christy Sutton, female   DOB: 08-09-1926, 83 y.o.   MRN: [id number]   Christy Sutton, Christy Legacy, LCSW Social Worker Signed   Patient Care Conference Date of Service: 03/27/2017  2:39 PM      Hide copied text Hover for attribution information Inpatient RehabilitationTeam Conference and Plan of Care Update Date: 03/27/2017   Time: 2:15 PM      Patient Name: Christy Sutton      Medical Record Number: [id number]  Date of Birth: 17-Sep-1925 Sex: Female         Room/Bed: 4W05C/4W05C-01 Payor Info: Payor: MEDICARE / Plan: MEDICARE PART A AND B / Product Type: *No Product type* /     Admitting Diagnosis: CVA  Admit Date/Time:  03/09/2017  4:24 PM Admission Comments: No comment available    Primary Diagnosis:  <principal problem not specified> Principal Problem: <principal problem not specified>       Patient Active Problem List    Diagnosis Date Noted  . Left hemiparesis ([place])    . Acute blood loss anemia    . Benign essential HTN    . Hypokalemia    . Hypoalbuminemia due to protein-calorie malnutrition ([place])    . Stroke due to embolism ([place]) 03/09/2017  . Diabetes mellitus type 2 in nonobese ([facility])    . History of CVA (cerebrovascular accident)    . Cerebral embolism with cerebral infarction 03/07/2017  . Stroke ([place]) 01/17/2017  . TIA (transient ischemic attack) 01/17/2017  . Dysmetria 01/17/2017  . Essential hypertension 01/17/2017  . Diabetes mellitus with complication ([place]) 57/84/6962  . Permanent atrial fibrillation ([place]) 01/17/2017  . Ischemic stroke [facility])        Expected Discharge Date: Expected Discharge Date: 03/31/17   Team Members Present: Physician leading conference: Dr. Alger Simons Social Worker Present: Christy Alpers, LCSW Nurse Present: Christy Roberts, RN PT Present: Christy Sutton, PT OT Present: Christy Sutton, [place], OT SLP Present: Christy Sutton, SLP PPS Coordinator present : Christy Nakayama, RN, CRRN       Current Status/Progress  Goal Weekly Team Focus  Medical     new weakness LLE, sensory changes---follow up work up under way  improve use of left leg  hr, anticoagulation, see above   Bowel/Bladder     Pt is cont x 2  pt will remain cont x2 during this admission  pending UA and urine cx, treat if needed   Swallow/Nutrition/ Hydration               ADL's     improved standing balance to min A with progressing L side awareness so pt can complete self care at a steadying A level, close to meeting LTGs  min A overall  ADL training, standing balance, postural control, pt/family education   Mobility     S bed mobility, min guard transfers and gait with RW and AFO, minA stairs  S bed mobility and transfers, minA gait in home and stairs for strengthening  L NMR, transfer and gait training, need family hands on training prior to d/c   Communication               Safety/Cognition/ Behavioral Observations   pt is alert and oriented, pt understands to use call bell for assistants  Pt will remain free of falls during this admission  to maintain safety   Pain     Pt has been free of pain  Maintain control of pain level during this admission  pt  will maintain a pain level of less than 4   Skin     Pt has generalized bruising, pt currently on aspirin and pradaxa  Pt will remain free of new skin tear or breakdown during this admission  Pt will remain free of new skin tears or breakdown     Rehab Goals Patient on target to meet rehab goals: Yes Rehab Goals Revised: none *See Care Plan and progress notes for long and short-term goals.      Barriers to Discharge   Current Status/Progress Possible Resolutions Date Resolved   Physician     Medical stability        repeat MRI/neurology consult      Nursing                 PT                    OT                 SLP            SW              Discharge Planning/Teaching Needs:  Pt will return home with her family and they will arrange 24/7 care.  Family has been  coming to therapy for family education and will continue to do so upon d/c.   Team Discussion:  Pt awaiting neuro workup due to pt feeling her left leg is different and she feels she has had another stroke.  Dr. Naaman Sutton talked to pt's son to explain pt's condition and that she is on a blood thinner to try to prevent future events.  Pt is steadily progressing and needs a L AFO.  Family education for PT and OT requested and CSW to arrange.  Pt is impulsive at times and doesn't slow down during ADL tasks, especially toileting.  Pt is continent and nursing has completed stroke prevention education with pt and family.  Revisions to Treatment Plan:  none    Continued Need for Acute Rehabilitation Level of Care: The patient requires daily medical management by a physician with specialized training in physical medicine and rehabilitation for the following conditions: Daily direction of a multidisciplinary physical rehabilitation program to ensure safe treatment while eliciting the highest outcome that is of practical value to the patient.: Yes Daily medical management of patient stability for increased activity during participation in an intensive rehabilitation regime.: Yes Daily analysis of laboratory values and/or radiology reports with any subsequent need for medication adjustment of medical intervention for : Neurological problems   Christy Sutton, Christy Sutton 03/27/2017, 2:39 PM

## 2017-03-28 ENCOUNTER — Inpatient Hospital Stay (HOSPITAL_COMMUNITY): Payer: Medicare Other | Admitting: Occupational Therapy

## 2017-03-28 ENCOUNTER — Inpatient Hospital Stay (HOSPITAL_COMMUNITY): Payer: Medicare Other | Admitting: Physical Therapy

## 2017-03-28 LAB — URINE CULTURE: Culture: <10000 — AB

## 2017-03-28 LAB — GLUCOSE, CAPILLARY
GLUCOSE-CAPILLARY: 111 mg/dL — AB (ref 65–99)
Glucose-Capillary: 192 mg/dL — ABNORMAL HIGH (ref 65–99)
Glucose-Capillary: 94 mg/dL (ref 65–99)

## 2017-03-28 NOTE — Plan of Care (Signed)
MRI image reviewed. Formal result pending. I did not see any extension of previous stroke or any new acute infarct. His metabolic work up so far also negative. Not sure how to explain the worsening symptoms a couple of days ago, however, pt seems doing well for the last couple of days and working with PT/OT in good condition. Will recommend to continue current management and will see him as outpt for follow up on 04/04/17 with Cecille Rubin NP at [facility]. Please kindly call with any further questions.   Rosalin Hawking, MD PhD Stroke Neurology 03/28/2017 8:11 AM

## 2017-03-28 NOTE — Progress Notes (Signed)
Orthopedic Tech Progress Note Patient Details:  Christy Sutton Jan 21, 1926 [id number]  Patient ID: Christy Sutton, female   DOB: December 17, 1925, 83 y.o.   MRN: [id number]   Christy Sutton 03/28/2017, 1:51 PM Called in advanced brace order; spoke with [facility]

## 2017-03-28 NOTE — Progress Notes (Signed)
Recreational Therapy Discharge Summary Patient Details  Name: Shanyce Ferrone MRN: [id number] Date of Birth: 12/19/1925 Today's Date: 03/28/2017  Session Note: 03/28/17 Pain:  No c/o Session focused on activity tolerance, discharge planning, fall recovery and safety awareness during co-treat with PT.  Pt propelled w/c on unit using BUE's with supervision.  Pt performed floor transfer with mod assist and mod verbal cues.    DISCHARGE: Comments on progress toward goals: Pt has made good progress during LOS and met TR participation goal.  Pt is scheduled for discharge home with family to provide 24 hour supervision/assistance 7/21.  Sessions focused on safety awareness, leisure participation/activity analysis with potential modifications specifically simple meal prep.  Pt is discharging home at close supervision-min assist level for TR tasks.  Pt requires moderate cuing for safety.  Reasons for discharge: discharge from hospital  Patient/family agrees with progress made and goals achieved: Yes  , 03/28/2017, 3:56 PM     

## 2017-03-28 NOTE — Progress Notes (Signed)
Social Work Patient ID: Christy Sutton, female   DOB: [birth year], 83 y.o.   MRN: [id number]   CSW met with pt and talked with her dtr-in-law via telephone to update them on team conference discussion and confirmed d/c date of 03-31-17 with [facility] and DME.  Family will come for family education on Friday morning.  CSW will make d/c arrangements.  Pt to go to her home with family coming to be with her 24/7.  CSW will continue to follow and assist as needed.

## 2017-03-28 NOTE — Progress Notes (Signed)
Occupational Therapy Note  Patient Details  Name: Christy Sutton MRN: [id number] Date of Birth: June 21, 1926  Today's Date: 03/28/2017 OT Individual Time: 1030-1130 OT Individual Time Calculation (min): 60 min   No c/o pain.  Pt seen this session to practice shower stall transfers.  Pt will be using a walk in shower at home, but is very adamant that she does not want to shower here in the hospital.  She only showers once in a while at home and often uses a sponge bath. Pt agreeable to practicing transfers.  Pt taken to ADL apt and simulated shower set up. Pt used her RW to ambulate to the bathroom. Pt needs increased support to balance when lifting R foot over shower ledge as her L leg is not strong enough to support her even with B hand on walker/ wall.  She only needed min A, but will need to train her daughter how to facilitate the stepping in.    Pt taken to gym and she used RW to ambulate towards the mat and sat unsupported for 45 min while working on [facility] tasks of buttons, zippers, lacing, tying bows.  1# dowel bar for shoulder ROM with forward presses, circle rows forward and back, bicep curls.  Pt transitioned back to w/c.  Returned to room with quick release belt on and all needs met.        [place] 03/28/2017, 11:08 AM

## 2017-03-28 NOTE — Progress Notes (Signed)
Physical Therapy Session Note  Patient Details  Name: Christy Sutton MRN: [id number] Date of Birth: [birth year]  Today's Date: 03/28/2017 PT Individual Time: 0810-0900 and 1300-1345 PT Individual Time Calculation (min): 50 min and 45 min (total 95 min)   Short Term Goals: Week 3:  PT Short Term Goal 1 (Week 3): =LTG due to estimated LOS  Skilled Therapeutic Interventions/Progress Updates: Tx 1: Pt received supine in bed, denies pain and agreeable to treatment. Pt reports she hadn't eaten because tray was placed too far away from her and she didn't want to bother anyone. Supine>sit on EOB with S. Stand pivot transfer to w/c with minA. Pt setup with breakfast to eat; missed 10 min to eat meal. W/c propulsion to/from gym with BUE for LUE NMR and coordination, strengthening and aerobic endurance. Stairs x2 trials 6" stairs x4 stairs per trial, B handrails and minA with cues for eccentric control RLE during backwards descent. Performed floor transfer with modA, mod verbal cueing for technique and problem solving. Educated pt on safety following a fall and recommendation that she have help to get up if she were to have a fall. Remained seated in w/c at end of session, quick release belt intact and all needs in reach.   Tx 2: Pt received seated in w/c, denies pain and agreeable to treatment. W/c propulsion BUE to/from gym [phone] each with S for aerobic endurance and strengthening. Dynamic gait with RW navigating around cones; minA initially, reduced to min guard by completion of second trial with demonstration of improve RW management, LLE placement and stance control. Static standing balance on airex pad to facilitate ankle strategy and righting reactions; maxA initially improved with min guard/minA with increased time and improved ankle strategy. One major LOB when L knee "gave out"; required totalA to sit safely on mat. Returned to bed at end of session min guard stand pivot. Sit >supine with S. Remained supine  with bed alarm intact, all needs in reach at completion of session.      Therapy Documentation Precautions:  Precautions Precautions: Fall Precaution Comments: L hemiparesis, impulsive, HOH Restrictions Weight Bearing Restrictions: No General: PT Amount of Missed Time (min): 10 Minutes PT Missed Treatment Reason: Other (Comment) (breakfast)   See Function Navigator for Current Functional Status.   Therapy/Group: Individual Therapy  [name] 03/28/2017, 10:03 AM

## 2017-03-28 NOTE — Discharge Summary (Signed)
Physician Discharge Summary  Patient ID: Christy Sutton MRN: [id number] DOB/AGE: 01-06-1926 83 y.o.  Admit date: 03/09/2017 Discharge date: 03/31/2017  Discharge Diagnoses:  Principal Problem:   Stroke due to embolism [facility]) Active Problems:   Permanent atrial fibrillation ([facility])   Acute blood loss anemia   Benign essential HTN   Hypokalemia   Hypoalbuminemia due to protein-calorie malnutrition ([facility])   Left hemiparesis ([place])   Discharged Condition: stable   Significant Diagnostic Studies: Dg Chest 2 View  Result Date: 03/26/2017 CLINICAL DATA:  Generalized weakness. EXAM: CHEST  2 VIEW COMPARISON:  10/18/2009 . FINDINGS: Mediastinum hilar structures normal. Cardiomegaly mild pulmonary vascular prominence. Mild subsegmental atelectasis left lung base. No pleural effusion or pneumothorax IMPRESSION: 1. Cardiomegaly with mild pulmonary vascular prominence. 2.  Mild subsegmental atelectasis left lung base . Electronically Signed   By: Marcello Moores  Register   On: 03/26/2017 13:24    Mr Brain Wo Contrast  Result Date: 03/28/2017 CLINICAL DATA:  Stroke.  Mildly worsening left leg weakness. EXAM: MRI HEAD WITHOUT CONTRAST TECHNIQUE: Multiplanar, multiecho pulse sequences of the brain and surrounding structures were obtained without intravenous contrast. COMPARISON:  03/07/2017 FINDINGS: Brain: Fading restricted diffusion in the posterior limb internal capsule on the right. No progression since prior. No new area of infarction. Neighboring remote insult in the upper and posterior right thalamus with mineralization. No acute hemorrhage, hydrocephalus, or masslike findings. Minimal chronic microvascular ischemic change in the cerebral white matter for age. Vascular: Negative Skull and upper cervical spine: Negative for marrow lesion. Marked ligamentous thickening or pannus behind the dens. No cervicomedullary impression. Sinuses/Orbits: Negative IMPRESSION: Known recent infarct in the posterior limb right  internal capsule. No new abnormality. Electronically Signed   By: Monte Fantasia M.D.   On: 03/28/2017 08:40     Labs:  Basic Metabolic Panel:  Recent Labs Lab 03/26/17 1236  NA 139  K 4.8  CL 106  CO2 25  GLUCOSE 206*  BUN 16  CREATININE 0.82  CALCIUM 9.1    CBC:  Recent Labs Lab 03/26/17 1236  WBC 8.6  NEUTROABS 6.7  HGB 12.3  HCT 37.4  MCV 91.2  PLT 166    CBG:  Recent Labs Lab 03/29/17 1139 03/29/17 1646 03/30/17 1120 03/30/17 1612 03/31/17 1130  GLUCAP 140* 102* 126* 127* 103*    Brief HPI:    Christy Combsis a 83 y.o.femalewith history of DM diet controlled, HTN, CVA 4/18 and 5/18, A fib on eliquis who was admitted on 03/07/2017 with worsening of left sided weakness with numbness, left facial droop and concerns of stroke extension. MRI brain reviewed, showing right CVA. Per report, restricted diffusion in right posterior limb internal capsule and thalamus consistent with acute infarct adjacent to area of prior ischemia and no stenosis or occlusion. Dr. Leonie Man felt that stroke embolic likely due to A fib and recommended changing Eliquis to Pradaxa. Patient with resultant left sided weakness, cognitive deficits with no safety awareness and hard to redirect. CIR recommended due to deficits in mobility and decreased ability to carry out ADL tasks   Hospital Course: Christy Sutton was admitted to rehab 03/09/2017 for inpatient therapies to consist of PT  and OT at least three hours five days a week. Past admission physiatrist, therapy team and rehab RN have worked together to provide customized collaborative inpatient rehab. Blood pressures have been monitored on bid basis and have been well controlled on low dose tenormin.  BS have been monitored on ac/hs basis and have been controlled  on modified diet. Po intake has been good and she is continent of bowel and bladder.  Check of lytes at admission showed hypokalemia which has resolved with brief supplementation. She  did report increase in LLE weakness and control. Metabolic work up and infectious work up was negative. MRI brain showed no new strokes or extension of stroke. Dr. Erlinda Hong recommended continuing  Pradaxa as patient was making progressing with therapy.  Left AFO with toe cap was ordered to help with gait. She continues to have impulsivity with left inattention and poor safety awareness. She has progressed to supervision to min assist. She will continue to receive follow up HHPT and [place] by [place] after discharge.    Rehab course: During patient's stay in rehab weekly team conferences were held to monitor patient's progress, set goals and discuss barriers to discharge. At admission, patient required max assist with basic ADL tasks and mod to total assist with mobility.  She has had improvement in activity tolerance, balance, postural control, as well as ability to compensate for deficits.   She is has had improvement in functional use LUE  and LLE.  dynamic balance has improved to steady assist and she is able to ambulate 180' with min to min guard assist. She is able to complete ADL tasks with min assist to supervision. Family education was completed regarding all aspects of care and mobility.     Disposition: 01-Home or Self Care  Diet: Carb modified. Hear Healthy.   Special Instructions: 1. Needs 24 hours supervision and assistance with medication management.   Discharge Instructions    Ambulatory referral to Physical Medicine Rehab    Complete by:  As directed    1-2 weeks transitional care appt     Allergies as of 03/31/2017      Reactions   Penicillins Hives   Has patient had a PCN reaction causing immediate rash, facial/tongue/throat swelling, SOB or lightheadedness with hypotension: Yes Has patient had a PCN reaction causing severe rash involving mucus membranes or skin necrosis: Unknown Has patient had a PCN reaction that required hospitalization: Unknown Has patient had a PCN  reaction occurring within the last 10 years: No If all of the above answers are "NO", then may proceed with Cephalosporin use.      Medication List    STOP taking these medications   CRANBERRY PO   lisinopril 5 MG tablet Commonly known as:  PRINIVIL,ZESTRIL   metFORMIN 500 MG tablet Commonly known as:  GLUCOPHAGE   simvastatin 40 MG tablet Commonly known as:  ZOCOR     TAKE these medications   aspirin 81 MG chewable tablet Chew 81 mg by mouth every evening.   atenolol 25 MG tablet Commonly known as:  TENORMIN Take 1 tablet (25 mg total) by mouth daily.   atorvastatin 20 MG tablet Commonly known as:  LIPITOR Take 1 tablet (20 mg total) by mouth daily at 6 PM. Notes to patient:  This takes place of Zocor   dabigatran 150 MG Caps capsule Commonly known as:  PRADAXA Take 1 capsule (150 mg total) by mouth every 12 (twelve) hours.      Follow-up Information    Leanna Battles, MD Follow up in 3 day(s).   Specialty:  Internal Medicine Why:  for post hospital follow up Contact information: [place] [address] [phone]        Meredith Staggers, MD Follow up.   Specialty:  Physical Medicine and Rehabilitation Why:  office will call you with follow up appointment Contact information: [place] [address] [phone]        Garvin Fila, MD. Call in 3 day(s).   Specialties:  Neurology, Radiology Why:  for follow up appointment after stroke Contact information: [address] [place] [address] [phone]           Signed: Bary Leriche 04/02/2017, 10:08 AM

## 2017-03-28 NOTE — Progress Notes (Signed)
[place] PHYSICAL MEDICINE & REHABILITATION     PROGRESS NOTE  Subjective/Complaints:  No new complaints. Feels well. Denies pain. Excited to be going home  ROS: pt denies nausea, vomiting, diarrhea, cough, shortness of breath or chest pain   Objective: Vital Signs: Blood pressure (!) 118/21, pulse 70, temperature (!) 97.4 F (36.3 C), temperature source Oral, resp. rate 16, weight 58.7 kg (129 lb 6.6 oz), SpO2 99 %. Dg Chest 2 View  Result Date: 03/26/2017 CLINICAL DATA:  Generalized weakness. EXAM: CHEST  2 VIEW COMPARISON:  10/18/2009 . FINDINGS: Mediastinum hilar structures normal. Cardiomegaly mild pulmonary vascular prominence. Mild subsegmental atelectasis left lung base. No pleural effusion or pneumothorax IMPRESSION: 1. Cardiomegaly with mild pulmonary vascular prominence. 2.  Mild subsegmental atelectasis left lung base . Electronically Signed   By: Marcello Moores  Register   On: 03/26/2017 13:24    Recent Labs  03/26/17 1236  WBC 8.6  HGB 12.3  HCT 37.4  PLT 166    Recent Labs  03/26/17 1236  NA 139  K 4.8  CL 106  GLUCOSE 206*  BUN 16  CREATININE 0.82  CALCIUM 9.1   CBG (last 3)   Recent Labs  03/27/17 0649 03/27/17 1132 03/27/17 1655  GLUCAP 120* 120* 112*    Wt Readings from Last 3 Encounters:  03/22/17 58.7 kg (129 lb 6.6 oz)  03/07/17 64.9 kg (143 lb)  01/18/17 67.6 kg (149 lb 0.5 oz)    Physical Exam:  BP (!) 118/21 (BP Location: Left Arm)   Pulse 70   Temp (!) 97.4 F (36.3 C) (Oral)   Resp 16   Wt 58.7 kg (129 lb 6.6 oz)   SpO2 99%   BMI 22.21 kg/m  Constitutional: She appears well-developed and well-nourished.  HENT: Normocephalic and atraumatic.  Eyes: EOM are normal. No discharge.   Cardiovascular: RRR without murmur. No JVD     Respiratory: CTA Bilaterally without wheezes or rales. Normal effort   GI: Soft. Bowel sounds are normal.   Musculoskeletal: She exhibits no edema or tenderness.  Neurological: She is alert and oriented.   Extremely HOH.  Minimal dysarthria.  Motor: RUE/RLE: 5/5 proximal to distal LUE: 4+/5 with improved fmc LLE: HF  3-/5, KE 3/5, ADF/PF 3+/5  ?decreased LT left arm and leg Skin: Skin is warm and dry.  Psychiatric: She has a normal mood and affect. Her behavior is normal. Thought content normal.    Assessment/Plan: 1. Functional deficits secondary to right CVA which require 3+ hours per day of interdisciplinary therapy in a comprehensive inpatient rehab setting. Physiatrist is providing close team supervision and 24 hour management of active medical problems listed below. Physiatrist and rehab team continue to assess barriers to discharge/monitor patient progress toward functional and medical goals.  Function:  Bathing Bathing position Bathing activity did not occur: Refused Position: Wheelchair/chair at sink  Bathing parts Body parts bathed by patient: Left arm, Chest, Abdomen, Front perineal area, Right arm, Buttocks (pt declined to wash legs) Body parts bathed by helper: Back  Bathing assist Assist Level: Touching or steadying assistance(Pt > 75%)      Upper Body Dressing/Undressing Upper body dressing   What is the patient wearing?: Bra, Pull over shirt/dress Bra - Perfomed by patient: Thread/unthread right bra strap, Thread/unthread left bra strap, Hook/unhook bra (pull down sports bra) Bra - Perfomed by helper: Hook/unhook bra (pull down sports bra) Pull over shirt/dress - Perfomed by patient: Thread/unthread right sleeve, Thread/unthread left sleeve, Put head through opening,  Pull shirt over trunk Pull over shirt/dress - Perfomed by helper: Pull shirt over trunk        Upper body assist Assist Level: Set up Assistive Device Comment: button hook Set up : To obtain clothing/put away  Lower Body Dressing/Undressing Lower body dressing   What is the patient wearing?: Underwear, Shoes, Socks, AFO, Pants Underwear - Performed by patient: Thread/unthread left underwear leg,  Thread/unthread right underwear leg, Pull underwear up/down Underwear - Performed by helper: Thread/unthread right underwear leg, Pull underwear up/down Pants- Performed by patient: Thread/unthread left pants leg, Thread/unthread right pants leg, Pull pants up/down Pants- Performed by helper: Thread/unthread right pants leg Non-skid slipper socks- Performed by patient: Don/doff right sock, Don/doff left sock Non-skid slipper socks- Performed by helper: Don/doff left sock, Don/doff right sock Socks - Performed by patient: Don/doff right sock, Don/doff left sock Socks - Performed by helper: Don/doff left sock Shoes - Performed by patient: Don/doff right shoe Shoes - Performed by helper: Don/doff left shoe, Fasten right, Fasten left, Don/doff right shoe   AFO - Performed by helper: Don/doff left AFO      Lower body assist Assist for lower body dressing: Touching or steadying assistance (Pt > 75%)      Toileting Toileting Toileting activity did not occur: No continent bowel/bladder event Toileting steps completed by patient: Adjust clothing prior to toileting, Performs perineal hygiene, Adjust clothing after toileting Toileting steps completed by helper: Adjust clothing after toileting Toileting Assistive Devices: Grab bar or rail  Toileting assist Assist level: Touching or steadying assistance (Pt.75%)   Transfers Chair/bed transfer   Chair/bed transfer method: Stand pivot Chair/bed transfer assist level: Touching or steadying assistance (Pt > 75%) Chair/bed transfer assistive device: Armrests, Bedrails     Locomotion Ambulation     Max distance: 75 Assist level: Touching or steadying assistance (Pt > 75%)   Wheelchair   Type: Manual Max wheelchair distance: 150 Assist Level: Supervision or verbal cues  Cognition Comprehension Comprehension assist level: Follows basic conversation/direction with no assist  Expression Expression assist level: Expresses basic needs/ideas: With  no assist  Social Interaction Social Interaction assist level: Interacts appropriately with others with medication or extra time (anti-anxiety, antidepressant).  Problem Solving Problem solving assist level: Solves basic 75 - 89% of the time/requires cueing 10 - 24% of the time  Memory Memory assist level: Recognizes or recalls 75 - 89% of the time/requires cueing 10 - 24% of the time    Medical Problem List and Plan: 1.   Left sided weakness, cognitive deficits with no safety awareness and hard to redirect secondary to right CVA on 6/27.  Continue CIR therapies  -follow up MRI performed last night, pending  -other ordered tests negative as expected.   -pt making continued gains with therapy 2.  DVT Prophylaxis/Anticoagulation: Pharmaceutical: Pradexa 3. Pain Management: tylenol prn 4. Mood: LCSW to follow for evaluation and support 5. Neuropsych: This patient is capable of making decisions on her own behalf. 6. Skin/Wound Care: routine pressure relief measures.  7. Fluids/Electrolytes/Nutrition: Monitor I/O  -labs all reviewed again today.  8. Anemia:   Hb 12.3 on 7.16  Cont to monitor 9. DM diet controlled: Hgb A1c- 6.0.  BS stable overall will check BS bid with SSI as needed.  -CM diet  -reasonable control at present 10. HTN: monitor BP bid and avoid hypotension.  Continue atenolol daily.    -good control 11. Hypokalemia  -K+ 4.8---can dc potassium  -continue supp    LOS (Days)  [place] EVALUATION WAS PERFORMED  SWARTZ,ZACHARY T 03/28/2017 7:48 AM

## 2017-03-28 NOTE — Progress Notes (Signed)
Occupational Therapy Session Note  Patient Details  Name: Christy Sutton MRN: [id number] Date of Birth: 1926/06/19  Today's Date: 03/28/2017 OT Individual Time: 4270-6237 OT Individual Time Calculation (min): 30 min    Short Term Goals: Week 2:  OT Short Term Goal 1 (Week 2): Pt will be able to stand with min A or less while pulling pants over hips post toileting or during dressing. OT Short Term Goal 1 - Progress (Week 2): Met OT Short Term Goal 2 (Week 2): Pt will demonstrate improved L side awareness by positioning her L foot in safe position prior to standing.   OT Short Term Goal 2 - Progress (Week 2): Met OT Short Term Goal 3 (Week 2): Pt will be able to don socks with set up.   OT Short Term Goal 3 - Progress (Week 2): Met Week 3:  OT Short Term Goal 1 (Week 3): STGs = LTGs  Skilled Therapeutic Interventions/Progress Updates:    1:1 Participated in furniture transfers (squat pivot) w/c to lower couch. To the couch was min A. Sitting on the couch pt participated in [facility] and strengthen intrinsic mm with red theraputty including manipulation, picking up coins and finding coins in putty with isolated left hand. Pt required mod A to get off couch due to impulsivity and moving w/c and couch with transitional movement.  Returned to room with quick release and left with SW.   Therapy Documentation Precautions:  Precautions Precautions: Fall Precaution Comments: L hemiparesis, impulsive, HOH Restrictions Weight Bearing Restrictions: No   Pain:  no  C/o pain  ADL: ADL ADL Comments: Please see functional navigator for ADL status  See Function Navigator for Current Functional Status.   Therapy/Group: Individual Therapy  [name] [place] 03/28/2017, 10:34 AM

## 2017-03-29 ENCOUNTER — Inpatient Hospital Stay (HOSPITAL_COMMUNITY): Payer: Medicare Other | Admitting: Physical Therapy

## 2017-03-29 ENCOUNTER — Inpatient Hospital Stay (HOSPITAL_COMMUNITY): Payer: Medicare Other

## 2017-03-29 ENCOUNTER — Inpatient Hospital Stay (HOSPITAL_COMMUNITY): Payer: Medicare Other | Admitting: Occupational Therapy

## 2017-03-29 LAB — GLUCOSE, CAPILLARY
GLUCOSE-CAPILLARY: 102 mg/dL — AB (ref 65–99)
Glucose-Capillary: 140 mg/dL — ABNORMAL HIGH (ref 65–99)

## 2017-03-29 NOTE — Progress Notes (Signed)
Occupational Therapy Note  Patient Details  Name: Christy Sutton MRN: [id number] Date of Birth: August 22, 1926  Today's Date: 03/29/2017 OT Individual Time: 1135-1200 OT Individual Time Calculation (min): 25 min   Pt denies pain Individual Therapy  Pt resting in w/c upon arrival.  Pt engaged in sit<>stand, standing balance, and functional amb with RW.  Focus on LLE weight bearing during standing activities and weight shifts.  Pt continues to required max verbal cues for upright posture with ambulation and picking up LLE/foot when walking.  Pt returned to w/c and remained in w/c with all needs within reach and QRB in place.    Leotis Shames [facility] 03/29/2017, 12:06 PM

## 2017-03-29 NOTE — Progress Notes (Signed)
Occupational Therapy Session Note  Patient Details  Name: Christy Sutton MRN: [id number] Date of Birth: 03/09/1926  Today's Date: 03/29/2017 OT Individual Time: 0845-0945 OT Individual Time Calculation (min): 60 min    Short Term Goals: Week 2:  OT Short Term Goal 1 (Week 2): Pt will be able to stand with min A or less while pulling pants over hips post toileting or during dressing. OT Short Term Goal 1 - Progress (Week 2): Met OT Short Term Goal 2 (Week 2): Pt will demonstrate improved L side awareness by positioning her L foot in safe position prior to standing.   OT Short Term Goal 2 - Progress (Week 2): Met OT Short Term Goal 3 (Week 2): Pt will be able to don socks with set up.   OT Short Term Goal 3 - Progress (Week 2): Met Week 3:  OT Short Term Goal 1 (Week 3): STGs = LTGs  Skilled Therapeutic Interventions/Progress Updates:    Pt seen for ADL retraining with a focus on problem solving and awareness of LLE.  Pt is improving with L foot placement but continues to need cues to fully bring L foot back into place prior to standing.  Pt did well fastening buttons and tying her shoes. She places her L foot on a chair in front of her to don sock and tie her L shoe.  Pt worked on transfers on and off toilet using grab bar with touching A.  Discussed her graduation/ family education day planned for tomorrow.  Pt resting in w/c with quick release belt on and all needs met.    Therapy Documentation Precautions:  Precautions Precautions: Fall Precaution Comments: L hemiparesis, impulsive, HOH Restrictions Weight Bearing Restrictions: No       Pain: no c/o pain   ADL: ADL ADL Comments: Please see functional navigator for ADL status    See Function Navigator for Current Functional Status.   Therapy/Group: Individual Therapy  , 03/29/2017, 11:33 AM  

## 2017-03-29 NOTE — Progress Notes (Signed)
[place] PHYSICAL MEDICINE & REHABILITATION     PROGRESS NOTE  Subjective/Complaints:  Just waking up. No new problems  ROS: pt denies nausea, vomiting, diarrhea, cough, shortness of breath or chest pain    Objective: Vital Signs: Blood pressure 122/60, pulse 65, temperature 97.7 F (36.5 C), temperature source Oral, resp. rate 17, weight 59.9 kg (132 lb 1.6 oz), SpO2 96 %. Mr Brain Wo Contrast  Result Date: 03/28/2017 CLINICAL DATA:  Stroke.  Mildly worsening left leg weakness. EXAM: MRI HEAD WITHOUT CONTRAST TECHNIQUE: Multiplanar, multiecho pulse sequences of the brain and surrounding structures were obtained without intravenous contrast. COMPARISON:  03/07/2017 FINDINGS: Brain: Fading restricted diffusion in the posterior limb internal capsule on the right. No progression since prior. No new area of infarction. Neighboring remote insult in the upper and posterior right thalamus with mineralization. No acute hemorrhage, hydrocephalus, or masslike findings. Minimal chronic microvascular ischemic change in the cerebral white matter for age. Vascular: Negative Skull and upper cervical spine: Negative for marrow lesion. Marked ligamentous thickening or pannus behind the dens. No cervicomedullary impression. Sinuses/Orbits: Negative IMPRESSION: Known recent infarct in the posterior limb right internal capsule. No new abnormality. Electronically Signed   By: Monte Fantasia M.D.   On: 03/28/2017 08:40    Recent Labs  03/26/17 1236  WBC 8.6  HGB 12.3  HCT 37.4  PLT 166    Recent Labs  03/26/17 1236  NA 139  K 4.8  CL 106  GLUCOSE 206*  BUN 16  CREATININE 0.82  CALCIUM 9.1   CBG (last 3)   Recent Labs  03/28/17 1213 03/28/17 1636 03/28/17 2051  GLUCAP 192* 94 111*    Wt Readings from Last 3 Encounters:  03/28/17 59.9 kg (132 lb 1.6 oz)  03/07/17 64.9 kg (143 lb)  01/18/17 67.6 kg (149 lb 0.5 oz)    Physical Exam:  BP 122/60 (BP Location: Left Arm)   Pulse 65    Temp 97.7 F (36.5 C) (Oral)   Resp 17   Wt 59.9 kg (132 lb 1.6 oz)   SpO2 96%   BMI 22.67 kg/m  Constitutional: She appears well-developed and well-nourished.  HENT: Normocephalic and atraumatic.  Eyes: EOM are normal. No discharge.   Cardiovascular: RRR without murmur. No JVD      Respiratory: CTA Bilaterally without wheezes or rales. Normal effort    GI: Soft. Bowel sounds are normal.   Musculoskeletal: She exhibits no edema or tenderness.  Neurological: She is alert and oriented.  Extremely HOH.  Minimal dysarthria.  Motor: RUE/RLE: 5/5 proximal to distal LUE: 4+/5 with improved fmc LLE: HF  3-/5, KE 3/5, ADF/PF 3+/5  ?decreased LT left arm and leg Skin: Skin is warm and dry.  Psychiatric: She has a normal mood and affect. Her behavior is normal. Thought content normal.    Assessment/Plan: 1. Functional deficits secondary to right CVA which require 3+ hours per day of interdisciplinary therapy in a comprehensive inpatient rehab setting. Physiatrist is providing close team supervision and 24 hour management of active medical problems listed below. Physiatrist and rehab team continue to assess barriers to discharge/monitor patient progress toward functional and medical goals.  Function:  Bathing Bathing position Bathing activity did not occur: Refused Position: Wheelchair/chair at sink  Bathing parts Body parts bathed by patient: Left arm, Chest, Abdomen, Front perineal area, Right arm, Buttocks (pt declined to wash legs) Body parts bathed by helper: Back  Bathing assist Assist Level: Touching or steadying assistance(Pt > 75%)  Upper Body Dressing/Undressing Upper body dressing   What is the patient wearing?: Bra, Pull over shirt/dress Bra - Perfomed by patient: Thread/unthread right bra strap, Thread/unthread left bra strap, Hook/unhook bra (pull down sports bra) Bra - Perfomed by helper: Hook/unhook bra (pull down sports bra) Pull over shirt/dress - Perfomed by  patient: Thread/unthread right sleeve, Thread/unthread left sleeve, Put head through opening, Pull shirt over trunk Pull over shirt/dress - Perfomed by helper: Pull shirt over trunk        Upper body assist Assist Level: Set up Assistive Device Comment: button hook Set up : To obtain clothing/put away  Lower Body Dressing/Undressing Lower body dressing   What is the patient wearing?: Underwear, Shoes, Socks, AFO, Pants Underwear - Performed by patient: Thread/unthread left underwear leg, Thread/unthread right underwear leg, Pull underwear up/down Underwear - Performed by helper: Thread/unthread right underwear leg, Pull underwear up/down Pants- Performed by patient: Thread/unthread left pants leg, Thread/unthread right pants leg, Pull pants up/down Pants- Performed by helper: Thread/unthread right pants leg Non-skid slipper socks- Performed by patient: Don/doff right sock, Don/doff left sock Non-skid slipper socks- Performed by helper: Don/doff left sock, Don/doff right sock Socks - Performed by patient: Don/doff right sock, Don/doff left sock Socks - Performed by helper: Don/doff left sock Shoes - Performed by patient: Don/doff right shoe Shoes - Performed by helper: Don/doff left shoe, Fasten right, Fasten left, Don/doff right shoe   AFO - Performed by helper: Don/doff left AFO      Lower body assist Assist for lower body dressing: Touching or steadying assistance (Pt > 75%)      Toileting Toileting Toileting activity did not occur: No continent bowel/bladder event Toileting steps completed by patient: Adjust clothing prior to toileting, Performs perineal hygiene, Adjust clothing after toileting Toileting steps completed by helper: Adjust clothing after toileting Toileting Assistive Devices: Grab bar or rail  Toileting assist Assist level: Touching or steadying assistance (Pt.75%)   Transfers Chair/bed transfer   Chair/bed transfer method: Stand pivot Chair/bed transfer assist  level: Touching or steadying assistance (Pt > 75%) Chair/bed transfer assistive device: Armrests     Locomotion Ambulation     Max distance: 75 Assist level: Touching or steadying assistance (Pt > 75%)   Wheelchair   Type: Manual Max wheelchair distance: 150 Assist Level: Supervision or verbal cues  Cognition Comprehension Comprehension assist level: Follows basic conversation/direction with no assist  Expression Expression assist level: Expresses basic needs/ideas: With no assist  Social Interaction Social Interaction assist level: Interacts appropriately with others with medication or extra time (anti-anxiety, antidepressant).  Problem Solving Problem solving assist level: Solves basic 75 - 89% of the time/requires cueing 10 - 24% of the time  Memory Memory assist level: Recognizes or recalls 75 - 89% of the time/requires cueing 10 - 24% of the time    Medical Problem List and Plan: 1.   Left sided weakness, cognitive deficits with no safety awareness and hard to redirect secondary to right CVA on 6/27.  Continue CIR therapies  -follow up MRI personally reviewed---stable appearance  -reviewed findings with son yesterday  -pt making continued gains with therapy 2.  DVT Prophylaxis/Anticoagulation: Pharmaceutical: Pradexa 3. Pain Management: tylenol prn 4. Mood: LCSW to follow for evaluation and support 5. Neuropsych: This patient is capable of making decisions on her own behalf. 6. Skin/Wound Care: routine pressure relief measures.  7. Fluids/Electrolytes/Nutrition: Monitor I/O  -encourage PO 8. Anemia:   Hb 12.3 on 7.16  Cont to monitor 9. DM diet  controlled: Hgb A1c- 6.0.  BS stable overall will check BS bid with SSI as needed.  -CM diet  -reasonable control at present 10. HTN: monitor BP bid and avoid hypotension.  Continue atenolol daily.    -good control 11. Hypokalemia  -K+ 4.8---can dc potassium       LOS (Days) 20 A FACE TO FACE EVALUATION WAS  PERFORMED  Macaria Bias T 03/29/2017 8:28 AM

## 2017-03-29 NOTE — Progress Notes (Signed)
Physical Therapy Session Note  Patient Details  Name: Christy Sutton MRN: [id number] Date of Birth: 07-01-1926  Today's Date: 03/29/2017 PT Individual Time: 1300-1355 PT Individual Time Calculation (min): 55 min   Short Term Goals: Week 3:  PT Short Term Goal 1 (Week 3): =LTG due to estimated LOS  Skilled Therapeutic Interventions/Progress Updates: Pt received seated in w/c, denies pain and agreeable to treatment. W/c propulsion to/from gym with BUE and S. Stairs ascent/descent 2x12 at 6" height stairs with B handrails and minA. Pt self-selected backward descent with RLE and demonstrates increased safety despite lowering with weaker LLE. Orthotist present to assess pt for appropriateness of AFO; 3 gait trials with PLS and toe-off AFOs. Level of assist min guard with both, improved foot clearance and overall gait mechanics with toe-off, and will add toe cap to improve swing through. Educated pt on goals and purpose of AFO and plan to add toe cap; pt agreeable. Side stepping R/L with BUE support on rail and tactile facilitation for LLE stance and pelvic control. Returned to room with w/c propulsion as above. Remained seated in w/c at end of session, quick release belt intact and all needs in reach.      Therapy Documentation Precautions:  Precautions Precautions: Fall Precaution Comments: L hemiparesis, impulsive, HOH Restrictions Weight Bearing Restrictions: No  See Function Navigator for Current Functional Status.   Therapy/Group: Individual Therapy  [name] 03/29/2017, 1:55 PM

## 2017-03-29 NOTE — Progress Notes (Signed)
Physical Therapy Session Note  Patient Details  Name: Christy Sutton MRN: [id number] Date of Birth: [birth year]  Today's Date: 03/29/2017 PT Individual Time: 1015-1100 PT Individual Time Calculation (min): 45 min   Short Term Goals: Week 2:  PT Short Term Goal 1 (Week 2): Pt will demonstrate bed mobility with S PT Short Term Goal 1 - Progress (Week 2): Met PT Short Term Goal 2 (Week 2): Pt will demostrate stand pivot transfers consistent min guard PT Short Term Goal 2 - Progress (Week 2): Progressing toward goal PT Short Term Goal 3 (Week 2): pt will demonstrate gait with LRAD and modA PT Short Term Goal 3 - Progress (Week 2): Met PT Short Term Goal 4 (Week 2): Pt will demonstrate dynamic standing balance modA x5 min PT Short Term Goal 4 - Progress (Week 2): Met  Skilled Therapeutic Interventions/Progress Updates:    no c/o pain. PT treatment session focused on functional transfers, ambulation, stairs and standing balance. Pt sitting in w/c on arrival, agreeable to PT treatment session. Pt performed w/c mobility with B UEs 360f from room to ortho gym mod-I for UE strength and coordination and activity tolerance. Pt performed stand pivot transfer w/c <> simulated SUV height car, without AD, with min assist for steadying and verbal cues on technique and UE placement. Pt performed sit to stands from w/c or mat throughout session with supervision and pt able to remember UE placement >90% of time. Pt ascended/descended 8 stairs with B UE support and min assist for balance with verbal and tactile cues for L LE lifting and placement on step. Pt performed normal stance on airex without UE support and able to maintain balance for 30 secs then with a narrow BOS on airex without UE support for 15secs. Pt performed 156fw/c mobility with B UEs to room mod-I. Pt left sitting in w/c in room with QRB on and call bell in reach.   Therapy Documentation Precautions:  Precautions Precautions: Fall Precaution  Comments: L hemiparesis, impulsive, HOH Restrictions Weight Bearing Restrictions: No   See Function Navigator for Current Functional Status.   Therapy/Group: Individual Therapy  Charlaine Utsey 03/29/2017, 12:18 PM

## 2017-03-29 NOTE — Progress Notes (Signed)
Orthopedic Tech Progress Note Patient Details:  Christy Sutton 1925-11-25 [id number]  Ortho Devices Ortho Device/Splint Location: Toe off Brace (Left) Ordered via [employer]. Ortho Device/Splint Interventions: Boris Lown 03/29/2017, 10:53 AM

## 2017-03-30 ENCOUNTER — Inpatient Hospital Stay (HOSPITAL_COMMUNITY): Payer: Medicare Other | Admitting: Occupational Therapy

## 2017-03-30 ENCOUNTER — Ambulatory Visit (HOSPITAL_COMMUNITY): Payer: Medicare Other | Admitting: Physical Therapy

## 2017-03-30 LAB — GLUCOSE, CAPILLARY
GLUCOSE-CAPILLARY: 127 mg/dL — AB (ref 65–99)
Glucose-Capillary: 126 mg/dL — ABNORMAL HIGH (ref 65–99)

## 2017-03-30 MED ORDER — ATENOLOL 25 MG PO TABS: 25.0000 mg | ORAL_TABLET | Freq: Every day | ORAL | 0 refills | Status: DC

## 2017-03-30 MED ORDER — ATORVASTATIN CALCIUM 20 MG PO TABS: 20.0000 mg | ORAL_TABLET | Freq: Every day | ORAL | 0 refills | Status: DC

## 2017-03-30 MED ORDER — DABIGATRAN ETEXILATE MESYLATE 150 MG PO CAPS: 150.0000 mg | ORAL_CAPSULE | Freq: Two times a day (BID) | ORAL | 0 refills | Status: DC

## 2017-03-30 NOTE — Progress Notes (Signed)
Occupational Therapy Session Note  Patient Details  Name: Christy Sutton MRN: [id number] Date of Birth: 1926/01/14  Today's Date: 03/30/2017 OT Individual Time: 1332-1430 OT Individual Time Calculation (min): 58 min    Short Term Goals: Week 3:  OT Short Term Goal 1 (Week 3): STGs = LTGs  Skilled Therapeutic Interventions/Progress Updates:    Pt received sitting in w/c ready for OT tx session. Pt transported to therapy gym via w/c where Pt completed stand pivot transfer to mat table using RW with MinA. Pt completes sit<>stand at RW throughout session with minA-supervision. Pt engaged in dynamic standing activity with focus on using LUE to complete peg board activity with RUE support on table. MinGuard assist throughout during standing to complete activity with intermittent seated rest breaks. Pt returned to w/c and returned to room in manner described above where Pt engaged in standing theraputty exercises utilizing LUE and incorporating RUE for intermittent bil UE tasks. Pt stood approx 15 min while engaging in tasks with minguard assist and using intermittent single UE support on table. After seated rest break Pt ambulates to bathroom using RW with close supervision throughout for toileting, completing clothing management and standing to wash hands at sink after toilet completion with close supervision. Pt ambulates to sit EOB, returns to supine in bed with supervision where Pt was left with call bell and needs within reach, bed alarm activated.   Therapy Documentation Precautions:  Precautions Precautions: Fall Precaution Comments: L hemiparesis, impulsive, HOH Restrictions Weight Bearing Restrictions: No   Pain: Pain Assessment Pain Assessment: No/denies pain ADL: ADL ADL Comments: Please see functional navigator for ADL status  See Function Navigator for Current Functional Status.   Therapy/Group: Individual Therapy  Raymondo Band 03/30/2017, 4:12 PM

## 2017-03-30 NOTE — Discharge Instructions (Signed)
Inpatient Rehab Discharge Instructions  Christy Sutton Discharge date and time: 03/31/17   Activities/Precautions/ Functional Status: Activity: no lifting, driving, or strenuous exercise until cleared by MD Diet: cardiac diet and diabetic diet Wound Care: none needed   Functional status:  ___ No restrictions     ___ Walk up steps independently _X__ 24/7 supervision/assistance   ___ Walk up steps with assistance ___ Intermittent supervision/assistance  ___ Bathe/dress independently ___ Walk with walker     _X__ Bathe/dress with assistance ___ Walk Independently    ___ Shower independently _X__ Walk with supervision    ___ Shower with assistance _X__ No alcohol     ___ Return to work/school ________   Special Instructions: 1. Wear brace left foot to help with walking. 2.  Family needs to manage/administer medications.    COMMUNITY REFERRALS UPON DISCHARGE:   Home Health:   PT     OT           Agency:  [facility] Phone:  [phone] Medical Equipment/Items Ordered:  16"x16" lightweight wheelchair with basic cushion  Agency/Supplier:  [facility]         Phone:  [phone]  GENERAL COMMUNITY RESOURCES FOR PATIENT/FAMILY: Support Groups:  [facility] Stroke Support Group                              Meets the second Thursday of the month from 3-4PM (except June, July, and August)                              In [facility] at [facility], [facility] Day[facility]                              For more information, call [phone]   STROKE/TIA DISCHARGE INSTRUCTIONS SMOKING Cigarette smoking nearly doubles your risk of having a stroke & is the single most alterable risk factor  If you smoke or have smoked in the last 12 months, you are advised to quit smoking for your health.  Most of the excess cardiovascular risk related to smoking disappears within a year of stopping.  Ask you doctor about anti-smoking medications  [place] Quit Line:  1-800-QUIT NOW  Free Smoking Cessation Classes [phone]  CHOLESTEROL Know your levels; limit fat & cholesterol in your diet  Lipid Panel     Component Value Date/Time   CHOL 146 01/18/2017 0756   TRIG 75 01/18/2017 0756   HDL 68 01/18/2017 0756   CHOLHDL 2.1 01/18/2017 0756   VLDL 15 01/18/2017 0756   LDLCALC 63 01/18/2017 0756      Many patients benefit from treatment even if their cholesterol is at goal.  Goal: Total Cholesterol (CHOL) less than 160  Goal:  Triglycerides (TRIG) less than 150  Goal:  HDL greater than 40  Goal:  LDL (LDLCALC) less than 100   BLOOD PRESSURE American Stroke Association blood pressure target is less that 120/80 mm/Hg  Your discharge blood pressure is:  BP: (!) 116/59  Monitor your blood pressure  Limit your salt and alcohol intake  Many individuals will require more than one medication for high blood pressure  DIABETES (A1c is a blood sugar average for last 3 months) Goal HGBA1c is under 7% (HBGA1c is blood sugar average for last 3 months)  Diabetes:     Lab Results  Component Value Date   HGBA1C 6.0 (H) 01/18/2017     Your HGBA1c can be lowered with medications, healthy diet, and exercise.  Check your blood sugar as directed by your physician  Call your physician if you experience unexplained or low blood sugars.  PHYSICAL ACTIVITY/REHABILITATION Goal is 30 minutes at least 4 days per week  Activity: No driving, Therapies: See above Return to work: N/A  Activity decreases your risk of heart attack and stroke and makes your heart stronger.  It helps control your weight and blood pressure; helps you relax and can improve your mood.  Participate in a regular exercise program.  Talk with your doctor about the best form of exercise for you (dancing, walking, swimming, cycling).  DIET/WEIGHT Goal is to maintain a healthy weight  Your discharge diet is: Diet Carb Modified Fluid consistency: Thin; Room service appropriate? Yes   liquids Your height is:   Your current weight is: Weight: 59.9 kg (132 lb 1.6 oz) Your Body Mass Index (BMI) is:    Following the type of diet specifically designed for you will help prevent another stroke.  Your goal weight range is:   Your goal Body Mass Index (BMI) is 19-24.  Healthy food habits can help reduce 3 risk factors for stroke:  High cholesterol, hypertension, and excess weight.  RESOURCES Stroke/Support Group:  Call [phone]   STROKE EDUCATION PROVIDED/REVIEWED AND GIVEN TO PATIENT Stroke warning signs and symptoms How to activate emergency medical system (call 911). Medications prescribed at discharge. Need for follow-up after discharge. Personal risk factors for stroke. Pneumonia vaccine given:  Flu vaccine given:  My questions have been answered, the writing is legible, and I understand these instructions.  I will adhere to these goals & educational materials that have been provided to me after my discharge from the hospital.     My questions have been answered and I understand these instructions. I will adhere to these goals and the provided educational materials after my discharge from the hospital.  Patient/Caregiver Signature _______________________________ Date __________  Clinician Signature _______________________________________ Date __________  Please bring this form and your medication list with you to all your follow-up doctor's appointments.

## 2017-03-30 NOTE — Progress Notes (Signed)
Physical Therapy Discharge Summary  Patient Details  Name: Christy Sutton MRN: [id number] Date of Birth: 1926-06-24  Today's Date: 03/30/2017 PT Individual Time: 1000-1100 PT Individual Time Calculation (min): 60 min    Patient has met 10 of 10 long term goals due to improved activity tolerance, improved balance, improved postural control, increased strength, ability to compensate for deficits, functional use of  left upper extremity and left lower extremity, improved attention, improved awareness and improved coordination.  Patient to discharge at an ambulatory level [place].  Patient's care partner is independent to provide the necessary physical and cognitive assistance at discharge.  Reasons goals not met: All goals met  Recommendation:  Patient will benefit from ongoing skilled PT services in home health setting to continue to advance safe functional mobility, address ongoing impairments in balance, coordination, strength, L attention, safety awareness, and minimize fall risk.  Equipment: W/c, RW  Reasons for discharge: treatment goals met and discharge from hospital  Patient/family agrees with progress made and goals achieved: Yes  PT Discharge Precautions/Restrictions Precautions Precautions: Fall Precaution Comments: L hemiparesis, impulsive, HOH Restrictions Weight Bearing Restrictions: No Pain Pain Assessment Pain Assessment: No/denies pain Vision/Perception  Perception Inattention/Neglect: Does not attend to left side of body (mild inattention)  Cognition Orientation Level: Oriented X4 Sensation Sensation Light Touch: Appears Intact Proprioception: Impaired Detail Proprioception Impaired Details: Impaired LLE Coordination Gross Motor Movements are Fluid and Coordinated: No Heel Shin Test: impaired LLE Motor  Motor Motor: Hemiplegia Motor - Discharge Observations: L hemiparesis improving   Mobility Bed Mobility Bed Mobility: Sit to Supine;Supine to  Sit Supine to Sit: 5: Supervision Sit to Supine: 5: Supervision Transfers Transfers: Yes Sit to Stand: 5: Supervision;With upper extremity assist Sit to Stand Details: Verbal cues for precautions/safety;Verbal cues for technique Stand to Sit: 5: Supervision;With upper extremity assist Stand to Sit Details (indicate cue type and reason): Verbal cues for technique;Verbal cues for precautions/safety Stand Pivot Transfers: 5: Supervision Stand Pivot Transfer Details: Verbal cues for technique;Verbal cues for precautions/safety Locomotion  Ambulation Ambulation: Yes Ambulation/Gait Assistance: 4: Min guard;4: Min assist Ambulation Distance (Feet): 180 Feet Assistive device: Rolling walker;Other (Comment) (LLE AFO) Ambulation/Gait Assistance Details: Verbal cues for technique;Verbal cues for precautions/safety;Tactile cues for posture;Tactile cues for weight shifting;Manual facilitation for weight shifting Gait Gait: Yes Gait Pattern: Impaired Gait Pattern: Lateral hip instability;Poor foot clearance - left;Left foot flat;Left genu recurvatum;Narrow base of support Gait velocity: decreased Stairs / Additional Locomotion Stairs: Yes Stairs Assistance: 4: Min assist;4: Min guard Stairs Assistance Details: Verbal cues for precautions/safety;Verbal cues for technique;Tactile cues for weight shifting;Tactile cues for placement Stairs Assistance Details (indicate cue type and reason): occasional assist for LLE placement Stair Management Technique: Two rails;Step to pattern;Forwards;Backwards (forward ascent, backward descent) Number of Stairs: 12 Height of Stairs: 6 Ramp: 4: Min assist Curb: 4: Wellsite geologist: Yes Wheelchair Assistance: 6: Modified independent (Device/Increase time) Environmental health practitioner: Both upper extremities Wheelchair Parts Management: Needs assistance Distance: 150  Trunk/Postural Assessment  Cervical Assessment Cervical  Assessment: Within Functional Limits Thoracic Assessment Thoracic Assessment: Within Functional Limits Lumbar Assessment Lumbar Assessment: Within Functional Limits Postural Control Postural Control: Deficits on evaluation Righting Reactions: delayed and inefficient Protective Responses: delayed  Balance Static Sitting Balance Static Sitting - Level of Assistance: 6: Modified independent (Device/Increase time) Dynamic Sitting Balance Dynamic Sitting - Level of Assistance: 5: Stand by assistance Static Standing Balance Static Standing - Level of Assistance: 5: Stand by assistance Dynamic Standing Balance Dynamic Standing - Level of  Assistance: 4: Min assist Extremity Assessment  RUE Assessment RUE Assessment: Within Functional Limits LUE Assessment LUE Assessment: Within Functional Limits LUE Tone LUE Tone: Within Functional Limits RLE Assessment RLE Assessment: Within Functional Limits LLE Assessment LLE Assessment: Exceptions to WFL LLE PROM (degrees) LLE Overall PROM Comments: WFL assessed in sitting LLE Strength Left Hip Flexion: 3-/5 Left Knee Flexion: 3/5 Left Knee Extension: 3+/5 Left Ankle Dorsiflexion: 3/5 Left Ankle Plantar Flexion: 4-/5  Skilled Therapeutic Intervention: Pt received seated in w/c, denies pain and agreeable to treatment. Pt's son and daughter in law present for family education and hands-on training. Pt's daughter in law provided hands on minA for all mobility tasks including bed mobility, transfers, car transfer, gait in home environment, stairs for home entry. One planned failure when pt's daughter in law standing >5 ft away from pt while ambulating in ADL apartment and turning to sit in recliner with L LOB; required maxA from daughter and therapist to prevent fall. Again reiterated importance of standing directly beside pt with hands on her due to unexpected LOBs. Discussed possible change in function when d/c home, that pt may become more safe in a  familiar environment or may possibly become more impulsive and forget effect of new deficits on safety in the home; educated family on need to monitor pt's mobility at home, provide more support and assist than they think she needs initially and monitor progress. Pt ambulated 180' with RW and minA; note improved foot clearance with new toe cap on shoe. Pt reporting significant fatigue following two therapy sessions without rest break; requesting to rest at this time and missed 15 min PT. Provided pt and family with handouts regarding fall prevention and home safety, memory deficits following CVA, and contact information for orthotist. Pt and family with no further questions or concerns regarding d/c at this time.   See Function Navigator for Current Functional Status.  Benjiman Core Tygielski 03/30/2017, 11:14 AM

## 2017-03-30 NOTE — Progress Notes (Signed)
Occupational Therapy Discharge Summary  Patient Details  Name: Christy Sutton MRN: [id number] Date of Birth: August 15, 1926  Patient has met 90 of 11 long term goals due to improved activity tolerance, improved balance, postural control, ability to compensate for deficits, functional use of  LEFT upper and LEFT lower extremity, improved attention, improved awareness and improved coordination.  Patient to discharge at overall Supervision - touching A level.  Pt has made significant progress during her rehab stay.  On admission, her LUE AROM and coordination was severely impaired and now she can actively use it in all tasks including fastening buttons and tying shoes.  She has also progressed from max A with dynamic balance in standing to a steadying A level.  She can now ambulate in and out of the bathroom with a RW with steadying A.  She continues to be slightly impulsive at times demonstrating decreased anticipatory awareness and continues to need safety cues with foot and hand placement. Her daughter is able to cue her.   Patient's care partner is independent to provide the necessary physical and cognitive assistance at discharge.    Reasons goals not met: n/a  Recommendation:  Patient will benefit from ongoing skilled OT services in home health setting to continue to advance functional skills in the area of BADL and iADL.  Equipment: No equipment provided  Reasons for discharge: treatment goals met  Patient/family agrees with progress made and goals achieved: Yes  OT Discharge Precautions/Restrictions  Precautions Precautions: Fall Precaution Comments: L hemiparesis, impulsive, HOH Restrictions Weight Bearing Restrictions: No  ADL ADL ADL Comments: Please see functional navigator for ADL status Vision Baseline Vision/History: Wears glasses Wears Glasses: Reading only Patient Visual Report: No change from baseline Vision Assessment?: No apparent visual deficits Perception   Inattention/Neglect: Does not attend to left side of body (mild inattention) Praxis Praxis: Intact Cognition Overall Cognitive Status: History of cognitive impairments - at baseline (per daughter, pt has always rushed through things demonstrating decreased safety awareness with her deficits) Orientation Level: Oriented X4 Memory Impairment: Decreased recall of new information;Retrieval deficit Awareness: Impaired Awareness Impairment: Anticipatory impairment Sensation Sensation Light Touch: Appears Intact Stereognosis: Appears Intact Hot/Cold: Appears Intact Proprioception: Impaired Detail Proprioception Impaired Details: Impaired LLE Coordination Gross Motor Movements are Fluid and Coordinated: No Fine Motor Movements are Fluid and Coordinated: Yes Coordination and Movement Description: pt can now use LUE to fasten buttons, tie shoes Heel Shin Test: impaired LLE Motor  Motor Motor: Hemiplegia Motor - Discharge Observations: L hemiparesis improving  Mobility  Bed Mobility Bed Mobility: Sit to Supine;Supine to Sit Supine to Sit: 5: Supervision Sit to Supine: 5: Supervision Transfers Sit to Stand: 5: Supervision;With upper extremity assist Sit to Stand Details: Verbal cues for precautions/safety;Verbal cues for technique Stand to Sit: 5: Supervision;With upper extremity assist Stand to Sit Details (indicate cue type and reason): Verbal cues for technique;Verbal cues for precautions/safety  Trunk/Postural Assessment  Cervical Assessment Cervical Assessment: Within Functional Limits Thoracic Assessment Thoracic Assessment: Within Functional Limits Lumbar Assessment Lumbar Assessment: Within Functional Limits Postural Control Postural Control: Deficits on evaluation Righting Reactions: delayed and inefficient Protective Responses: delayed  Balance Static Sitting Balance Static Sitting - Level of Assistance: 6: Modified independent (Device/Increase time) Dynamic Sitting  Balance Dynamic Sitting - Level of Assistance: 5: Stand by assistance Static Standing Balance Static Standing - Level of Assistance: 5: Stand by assistance Dynamic Standing Balance Dynamic Standing - Level of Assistance: 4: Min assist Extremity/Trunk Assessment RUE Assessment RUE Assessment: Within Functional Limits LUE  Assessment LUE Assessment: Within Functional Limits LUE Tone LUE Tone: Within Functional Limits   See Function Navigator for Current Functional Status.  [place] 03/30/2017, 11:59 AM

## 2017-03-30 NOTE — Progress Notes (Signed)
[place] [facility]IC[facility] & RE[facility]ABILITATION     PROGRESS NOTE  Subjective/Complaints:  No new complaints. Excited about going home. Brace worked well for her yesterday  ROS: pt denies nausea, vomiting, diarrhea, cough, shortness of breath or chest pain     Objective: Vital Signs: Blood pressure 130/60, pulse 64, temperature 98.2 F (36.8 C), temperature source Oral, resp. rate 16, height 5\' 4"  (1.626 m), weight 59.9 kg (132 lb 1.6 oz), SpO2 100 %. No results found. No results for input(s): WBC, HGB, HCT, PLT in the last 72 hours. No results for input(s): NA, K, CL, GLUCOSE, BUN, CREATININE, CALCIUM in the last 72 hours.  Invalid input(s): CO CBG (last 3)   Recent Labs  03/28/17 2051 03/29/17 1139 03/29/17 1646  GLUCAP 111* 140* 102*    Wt Readings from Last 3 Encounters:  03/28/17 59.9 kg (132 lb 1.6 oz)  03/07/17 64.9 kg (143 lb)  01/18/17 67.6 kg (149 lb 0.5 oz)    Physical Exam:  BP 130/60 (BP Location: Left Arm)   Pulse 64   Temp 98.2 F (36.8 C) (Oral)   Resp 16   Ht 5\' 4"  (1.626 m)   Wt 59.9 kg (132 lb 1.6 oz)   SpO2 100%   BMI 22.67 kg/m  Constitutional: She appears well-developed and well-nourished.  HENT: Normocephalic and atraumatic.  Eyes: EOM are normal. No discharge.   Cardiovascular: RRR without murmur. No JVD      Respiratory: normal effort    GI: Soft. Bowel sounds are normal.   Musculoskeletal: She exhibits no edema or tenderness.  Neurological: She is alert and oriented.  Extremely HOH.  Minimal dysarthria.  Motor: RUE/RLE: 5/5 proximal to distal LUE: 4+/5 with improved fmc LLE: HF  3-/5, KE 3/5, ADF/PF 3+/5---stable ?decreased LT left arm and leg Skin: Skin is warm and dry.  Psychiatric: She has a normal mood and affect. Her behavior is normal. Thought content normal.    Assessment/Plan: 1. Functional deficits secondary to right CVA which require 3+ hours per day of interdisciplinary therapy in a comprehensive inpatient rehab  setting. Physiatrist is providing close team supervision and 24 hour management of active medical problems listed below. Physiatrist and rehab team continue to assess barriers to discharge/monitor patient progress toward functional and medical goals.  Function:  Bathing Bathing position Bathing activity did not occur: Refused Position: Wheelchair/chair at sink  Bathing parts Body parts bathed by patient: Left arm, Chest, Abdomen, Front perineal area, Right arm, Buttocks, Right upper leg, Left upper leg, Right lower leg, Left lower leg Body parts bathed by helper: Back  Bathing assist Assist Level: Touching or steadying assistance(Pt > 75%)      Upper Body Dressing/Undressing Upper body dressing   What is the patient wearing?: Pull over shirt/dress Bra - Perfomed by patient: Thread/unthread right bra strap, Thread/unthread left bra strap, Hook/unhook bra (pull down sports bra) Bra - Perfomed by helper: Hook/unhook bra (pull down sports bra) Pull over shirt/dress - Perfomed by patient: Thread/unthread right sleeve, Thread/unthread left sleeve, Put head through opening, Pull shirt over trunk Pull over shirt/dress - Perfomed by helper: Pull shirt over trunk        Upper body assist Assist Level: Set up Assistive Device Comment: button hook Set up : To obtain clothing/put away  Lower Body Dressing/Undressing Lower body dressing   What is the patient wearing?: Underwear, Shoes, Socks, AFO, Pants Underwear - Performed by patient: Thread/unthread left underwear leg, Thread/unthread right underwear leg, Pull underwear up/down  Underwear - Performed by helper: Thread/unthread right underwear leg, Pull underwear up/down Pants- Performed by patient: Thread/unthread left pants leg, Thread/unthread right pants leg, Pull pants up/down Pants- Performed by helper: Thread/unthread right pants leg Non-skid slipper socks- Performed by patient: Don/doff right sock, Don/doff left sock Non-skid slipper  socks- Performed by helper: Don/doff left sock, Don/doff right sock Socks - Performed by patient: Don/doff right sock, Don/doff left sock Socks - Performed by helper: Don/doff left sock Shoes - Performed by patient: Don/doff right shoe, Fasten right, Fasten left Shoes - Performed by helper: Don/doff left shoe   AFO - Performed by helper: Don/doff left AFO      Lower body assist Assist for lower body dressing: Touching or steadying assistance (Pt > 75%)      Toileting Toileting Toileting activity did not occur: N/A Toileting steps completed by patient: Adjust clothing prior to toileting, Performs perineal hygiene, Adjust clothing after toileting Toileting steps completed by helper: Adjust clothing after toileting Toileting Assistive Devices: Grab bar or rail  Toileting assist Assist level: Touching or steadying assistance (Pt.75%)   Transfers Chair/bed transfer   Chair/bed transfer method: Stand pivot Chair/bed transfer assist level: Touching or steadying assistance (Pt > 75%) Chair/bed transfer assistive device: Armrests     Locomotion Ambulation     Max distance: 125ft Assist level: Touching or steadying assistance (Pt > 75%)   Wheelchair   Type: Manual Max wheelchair distance: 344ft Assist Level: No help, No cues, assistive device, takes more than reasonable amount of time  Cognition Comprehension Comprehension assist level: Follows complex conversation/direction with no assist  Expression Expression assist level: Expresses complex ideas: With no assist  Social Interaction Social Interaction assist level: Interacts appropriately with others with medication or extra time (anti-anxiety, antidepressant).  Problem Solving Problem solving assist level: Solves basic 75 - 89% of the time/requires cueing 10 - 24% of the time  Memory Memory assist level: Recognizes or recalls 75 - 89% of the time/requires cueing 10 - 24% of the time    Medical Problem List and Plan: 1.   Left  sided weakness, cognitive deficits with no safety awareness and hard to redirect secondary to right CVA on 6/27.  Continue CIR therapies--home 7/21  -follow up MRI personally reviewed---stable appearance  -using AFO LLE  -Patient to see Rehab MD in the office for transitional care encounter in 1-2 weeks.  2.  DVT Prophylaxis/Anticoagulation: Pharmaceutical: Pradexa 3. Pain Management: tylenol prn 4. Mood: LCSW to follow for evaluation and support 5. Neuropsych: This patient is capable of making decisions on her own behalf. 6. Skin/Wound Care: routine pressure relief measures.  7. Fluids/Electrolytes/Nutrition: Monitor I/O  -encourage PO 8. Anemia:   Hb 12.3 on 7.16  Cont to monitor 9. DM diet controlled: Hgb A1c- 6.0.  BS stable overall will check BS bid with SSI as needed.  -CM diet  -reasonable control at present 10. HTN: monitor BP bid and avoid hypotension.  Continue atenolol daily.    -good control 11. Hypokalemia  -K+ 4.8       LOS (Days) 21 A FACE TO FACE EVALUATION WAS PERFORMED  SWARTZ,ZACHARY T 03/30/2017 8:15 AM

## 2017-03-30 NOTE — Progress Notes (Signed)
Occupational Therapy Session Note  Patient Details  Name: Rhiley Tarver MRN: [id number] Date of Birth: 1925/12/22  Today's Date: 03/30/2017 OT Individual Time: 0900-1000 OT Individual Time Calculation (min): 60 min    Short Term Goals: Week 3:  OT Short Term Goal 1 (Week 3): STGs = LTGs  Skilled Therapeutic Interventions/Progress Updates:    Pt seen for family education with her daughter and son in law and ADL training. Son in law stepped out of room during self care portion of session. Demonstrated to daughter with return demonstration of how to facilitate her balance and movement patterns, safety cues with L foot placement and placement of hands with all sit><stands, set up to enable her to be as independent as possible with self care (such as a chair in front of her to place her feet on to tie her shoes).  Reviewed and practiced: bed mobility, ambulation to the bathroom, toilet and shower stall transfers and where grab bar should be placed, grooming, toileting, bathing and dressing.  Pt's daughter had a good understanding of the need for full supervision as pt is a high fall risk.  Daughter stated that in the past she had to cue her mother to stay within the frame of the walker vs pushing it too far forward and to slow down. Son in law returned to room to see where she will need to have grab bars placed. Pt's family feel prepared to take her home tomorrow.  Therapy Documentation Precautions:  Precautions Precautions: Fall Precaution Comments: L hemiparesis, impulsive, HOH Restrictions Weight Bearing Restrictions: No      Pain: Pain Assessment Pain Assessment: No/denies pain ADL: ADL ADL Comments: Please see functional navigator for ADL status  See Function Navigator for Current Functional Status.   Therapy/Group: Individual Therapy  SAGUIER,JULIA 03/30/2017, 11:44 AM

## 2017-03-31 LAB — GLUCOSE, CAPILLARY: Glucose-Capillary: 103 mg/dL — ABNORMAL HIGH (ref 65–99)

## 2017-03-31 NOTE — Progress Notes (Signed)
Patient discharged home with all belongings about 1250. Discharge instructions went over with patient and patient's daughter. All questions answered.

## 2017-03-31 NOTE — Progress Notes (Signed)
[place] PH[facility] MEDICINE & REHABILITATION     PROGRESS NOTE  Subjective/Complaints:  Planning discharge today  ROS: pt denies nausea, vomiting, diarrhea, cough, shortness of breath or chest pain     Objective: Vital Signs: Blood pressure (!) 132/46, pulse 61, temperature (!) 97.1 F (36.2 C), temperature source Oral, resp. rate 16, height 5\' 4"  (1.626 m), weight 59.9 kg (132 lb 1.6 oz), SpO2 95 %. No results found. No results for input(s): WBC, HGB, HCT, PLT in the last 72 hours. No results for input(s): NA, K, CL, GLUCOSE, BUN, CREATININE, CALCIUM in the last 72 hours.  Invalid input(s): CO CBG (last 3)   Recent Labs  03/29/17 1646 03/30/17 1120 03/30/17 1612  GLUCAP 102* 126* 127*    Wt Readings from Last 3 Encounters:  03/28/17 59.9 kg (132 lb 1.6 oz)  03/07/17 64.9 kg (143 lb)  01/18/17 67.6 kg (149 lb 0.5 oz)    Physical Exam:  BP (!) 132/46 (BP Location: Left Arm)   Pulse 61   Temp (!) 97.1 F (36.2 C) (Oral)   Resp 16   Ht 5\' 4"  (1.626 m)   Wt 59.9 kg (132 lb 1.6 oz)   SpO2 95%   BMI 22.67 kg/m  Constitutional: She appears well-developed and well-nourished.  HENT: Normocephalic and atraumatic.  Eyes: EOM are normal. No discharge.   Cardiovascular: RRR without murmur. No JVD      Respiratory: normal effort    GI: Soft. Bowel sounds are normal.   Musculoskeletal: She exhibits no edema or tenderness.  Neurological: She is alert and oriented.  Extremely HOH.  Minimal dysarthria.  Motor: RUE/RLE: 5/5 proximal to distal LUE: 4+/5 with improved fmc LLE: HF  3-/5, KE 3/5, ADF/PF 3+/5---stable ?decreased LT left arm and leg Skin: Skin is warm and dry.  Psychiatric: She has a normal mood and affect. Her behavior is normal. Thought content normal.    Assessment/Plan: 1. Functional deficits secondary to right CVA  Stable for D/C today F/u PCP in 3-4 weeks F/u PM&R 2 weeks See D/C summary See D/C instructions  Function:  Bathing Bathing position  Bathing activity did not occur: Refused Position: Wheelchair/chair at sink  Bathing parts Body parts bathed by patient: Left arm, Chest, Abdomen, Front perineal area, Right arm, Buttocks, Right upper leg, Left upper leg, Right lower leg, Left lower leg Body parts bathed by helper: Back  Bathing assist Assist Level: Touching or steadying assistance(Pt > 75%)      Upper Body Dressing/Undressing Upper body dressing   What is the patient wearing?: Pull over shirt/dress, Bra Bra - Perfomed by patient: Thread/unthread right bra strap, Thread/unthread left bra strap, Hook/unhook bra (pull down sports bra) Bra - Perfomed by helper: Hook/unhook bra (pull down sports bra) Pull over shirt/dress - Perfomed by patient: Thread/unthread right sleeve, Thread/unthread left sleeve, Put head through opening, Pull shirt over trunk Pull over shirt/dress - Perfomed by helper: Pull shirt over trunk        Upper body assist Assist Level: Set up Assistive Device Comment: button hook Set up : To obtain clothing/put away  Lower Body Dressing/Undressing Lower body dressing   What is the patient wearing?: Underwear, Shoes, Socks, AFO, Pants Underwear - Performed by patient: Thread/unthread left underwear leg, Thread/unthread right underwear leg, Pull underwear up/down Underwear - Performed by helper: Thread/unthread right underwear leg, Pull underwear up/down Pants- Performed by patient: Thread/unthread left pants leg, Thread/unthread right pants leg, Pull pants up/down Pants- Performed by helper: Thread/unthread right  pants leg Non-skid slipper socks- Performed by patient: Don/doff right sock, Don/doff left sock Non-skid slipper socks- Performed by helper: Don/doff left sock, Don/doff right sock Socks - Performed by patient: Don/doff right sock, Don/doff left sock Socks - Performed by helper: Don/doff left sock Shoes - Performed by patient: Don/doff right shoe, Fasten right, Fasten left Shoes - Performed by  helper: Don/doff left shoe   AFO - Performed by helper: Don/doff left AFO      Lower body assist Assist for lower body dressing: Touching or steadying assistance (Pt > 75%)      Toileting Toileting Toileting activity did not occur: N/A Toileting steps completed by patient: Adjust clothing prior to toileting, Performs perineal hygiene, Adjust clothing after toileting Toileting steps completed by helper: Adjust clothing after toileting Toileting Assistive Devices: Grab bar or rail  Toileting assist Assist level: Supervision or verbal cues   Transfers Chair/bed transfer   Chair/bed transfer method: Stand pivot Chair/bed transfer assist level: Supervision or verbal cues Chair/bed transfer assistive device: Armrests, Medical sales representative     Max distance: 180 Assist level: Touching or steadying assistance (Pt > 75%)   Wheelchair   Type: Manual Max wheelchair distance: 175 Assist Level: No help, No cues, assistive device, takes more than reasonable amount of time  Cognition Comprehension Comprehension assist level: Understands complex 90% of the time/cues 10% of the time  Expression Expression assist level: Expresses complex 90% of the time/cues < 10% of the time  Social Interaction Social Interaction assist level: Interacts appropriately with others with medication or extra time (anti-anxiety, antidepressant).  Problem Solving Problem solving assist level: Solves basic 90% of the time/requires cueing < 10% of the time  Memory Memory assist level: Recognizes or recalls 75 - 89% of the time/requires cueing 10 - 24% of the time    Medical Problem List and Plan: 1.   Left sided weakness, cognitive deficits with no safety awareness and hard to redirect secondary to right CVA on 6/27.  Continue CIR therapies--home 7/21  -follow up MRI personally reviewed---stable appearance  -using AFO LLE  -Patient to see Rehab MD in the office for transitional care encounter in 1-2  weeks.  2.  DVT Prophylaxis/Anticoagulation: Pharmaceutical: Pradexa 3. Pain Management: tylenol prn 4. Mood: LCSW to follow for evaluation and support 5. Neuropsych: This patient is capable of making decisions on her own behalf. 6. Skin/Wound Care: routine pressure relief measures.  7. Fluids/Electrolytes/Nutrition: Monitor I/O  -encourage PO 8. Anemia:   Hb 12.3 on 7.16  Cont to monitor 9. DM diet controlled: Hgb A1c- 6.0.  BS stable overall will check BS bid with SSI as needed.  -CM diet  -reasonable control at present 10. HTN: monitor BP bid and avoid hypotension.  Continue atenolol daily.    -good control 11. Hypokalemia  -K+ 4.8       LOS (Days) 22 A FACE TO FACE EVALUATION WAS PERFORMED  Johnn Krasowski E 03/31/2017 11:30 AM

## 2017-04-02 NOTE — Progress Notes (Signed)
Social Work Discharge Note  The overall goal for the admission was met for:   Discharge location: Yes - home with family to come to provide 24/7 supervision/minimal assistance  Length of Stay: Yes - 23 days  Discharge activity level: Yes - supervision to minimal assistance  Home/community participation: Yes  Services provided included: MD, RD, PT, OT, RN, TR, Pharmacy and SW  Financial Services: Medicare  Follow-up services arranged: Home Health: PT/OT, DME: 16"x16" lightweight w/c with basic cushion; Pt has other recommended DME and Patient/Family request agency HH: [facility], DME: [facility]  Comments (or additional information): Pt to return to her home with family arranging to be with her 24/7.  Pt has some DME already in her home and son to install grab bars.  Pt will resume services with [facility].  CSW called to schedule a PCP post hospital f/u appt, with no return call back from office.  Pt instructed by PA to call for appt.  Family received education from therapists on how to take care of pt at home.    Patient/Family verbalized understanding of follow-up arrangements: Yes  Individual responsible for coordination of the follow-up plan: Pt with her son and dtr-in-law  Confirmed correct DME delivered: Trey Sailors 04/02/2017    Berdell Nevitt, [name]

## 2017-04-03 DIAGNOSIS — I69354 Hemiplegia and hemiparesis following cerebral infarction affecting left non-dominant side: Secondary | ICD-10-CM | POA: Diagnosis not present

## 2017-04-03 DIAGNOSIS — D649 Anemia, unspecified: Secondary | ICD-10-CM | POA: Diagnosis not present

## 2017-04-03 DIAGNOSIS — Z7901 Long term (current) use of anticoagulants: Secondary | ICD-10-CM | POA: Diagnosis not present

## 2017-04-03 DIAGNOSIS — I10 Essential (primary) hypertension: Secondary | ICD-10-CM | POA: Diagnosis not present

## 2017-04-03 DIAGNOSIS — E119 Type 2 diabetes mellitus without complications: Secondary | ICD-10-CM | POA: Diagnosis not present

## 2017-04-03 DIAGNOSIS — E785 Hyperlipidemia, unspecified: Secondary | ICD-10-CM | POA: Diagnosis not present

## 2017-04-03 DIAGNOSIS — I4891 Unspecified atrial fibrillation: Secondary | ICD-10-CM | POA: Diagnosis not present

## 2017-04-03 DIAGNOSIS — Z8673 Personal history of transient ischemic attack (TIA), and cerebral infarction without residual deficits: Secondary | ICD-10-CM | POA: Diagnosis not present

## 2017-04-04 ENCOUNTER — Encounter: Payer: Self-pay | Admitting: Nurse Practitioner

## 2017-04-04 ENCOUNTER — Ambulatory Visit (INDEPENDENT_AMBULATORY_CARE_PROVIDER_SITE_OTHER): Payer: Medicare Other | Admitting: Nurse Practitioner

## 2017-04-04 VITALS — BP 105/60 | HR 86 | Ht 63.0 in | Wt 123.2 lb

## 2017-04-04 DIAGNOSIS — I6310 Cerebral infarction due to embolism of unspecified precerebral artery: Secondary | ICD-10-CM | POA: Diagnosis not present

## 2017-04-04 DIAGNOSIS — I482 Chronic atrial fibrillation: Secondary | ICD-10-CM

## 2017-04-04 DIAGNOSIS — G8194 Hemiplegia, unspecified affecting left nondominant side: Secondary | ICD-10-CM | POA: Diagnosis not present

## 2017-04-04 DIAGNOSIS — I639 Cerebral infarction, unspecified: Secondary | ICD-10-CM

## 2017-04-04 DIAGNOSIS — E785 Hyperlipidemia, unspecified: Secondary | ICD-10-CM | POA: Diagnosis not present

## 2017-04-04 DIAGNOSIS — I4821 Permanent atrial fibrillation: Secondary | ICD-10-CM

## 2017-04-04 DIAGNOSIS — I10 Essential (primary) hypertension: Secondary | ICD-10-CM | POA: Diagnosis not present

## 2017-04-04 DIAGNOSIS — E1169 Type 2 diabetes mellitus with other specified complication: Secondary | ICD-10-CM | POA: Insufficient documentation

## 2017-04-04 NOTE — Patient Instructions (Addendum)
Stressed the importance of management of risk factors to prevent further stroke Continue Pradaxa and aspirin for secondary stroke prevention and atrial fibrillation Maintain strict control of hypertension with blood pressure goal below 130/90, today's reading 105/60 continue antihypertensive medications Control of diabetes with hemoglobin A1c below 6.5 followed by primary care most recent hemoglobin A1c6 Cholesterol with LDL cholesterol less than 70, followed by primary care,  most recent63 continue statin drugs Lipitor Continue physical therapy occupational therapy at home, perform home exercise program  eat healthy diet with whole grains,  fresh fruits and vegetables Given information on dehydration Follow-up in 4 months  Dehydration Dehydration is when there is not enough fluid or water in your body. This happens when you lose more fluids than you take in. People who are age 6 or older have a higher risk of getting dehydrated. Dehydration can range from mild to very bad. It should be treated right away to keep it from getting very bad. Symptoms of mild dehydration may include:  Thirst.  Dry lips.  Slightly dry mouth.  Dry, warm skin.  Dizziness. Symptoms of moderate dehydration may include:  Very dry mouth.  Muscle cramps.  Dark pee (urine). Pee may be the color of tea.  Your body making less pee.  Your eyes making fewer tears.  Heartbeat that is uneven or faster than normal (palpitations).  Headache.  Light-headedness, especially when you stand up from sitting.  Fainting (syncope). Symptoms of very bad dehydration may include:  Changes in skin, such as: ? Cold and clammy skin. ? Blotchy (mottled) or pale skin. ? Skin that does not quickly return to normal after being lightly pinched and let go (poor skin turgor).  Changes in body fluids, such as: ? Feeling very thirsty. ? Your eyes making fewer tears. ? Not sweating when body temperature is high, such as in  hot weather. ? Your body making very little pee.  Changes in vital signs, such as: ? Weak pulse. ? Pulse that is more than 100 beats a minute when you are sitting still. ? Fast breathing. ? Low blood pressure.  Other changes, such as: ? Sunken eyes. ? Cold hands and feet. ? Confusion. ? Lack of energy (lethargy). ? Trouble waking up from sleep. ? Short-term weight loss. ? Unconsciousness. Follow these instructions at home:  If told by your doctor, drink an ORS: ? Make an ORS by using instructions on the package. ? Start by drinking small amounts, about  cup (120 mL) every 5-10 minutes. ? Slowly drink more until you have had the amount that your doctor said to have.  Drink enough clear fluid to keep your pee clear or pale yellow. If you were told to drink an ORS, finish the ORS first, then start slowly drinking clear fluids. Drink fluids such as: ? Water. Do not drink only water by itself. Doing that can make the salt (sodium) level in your body get too low (hyponatremia). ? Ice chips. ? Fruit juice that you have added water to (diluted). ? Low-calorie sports drinks.  Avoid: ? Alcohol. ? Drinks that have a lot of sugar. These include high-calorie sports drinks, fruit juice that does not have water added, and soda. ? Caffeine. ? Foods that are greasy or have a lot of fat or sugar.  Take over-the-counter and prescription medicines only as told by your doctor.  Do not take salt tablets. Doing that can make the salt level in your body get too high (hypernatremia).  Eat foods that  have minerals (electrolytes). Examples include bananas, oranges, potatoes, tomatoes, and spinach.  Keep all follow-up visits as told by your doctor. This is important. Contact a doctor if:  You have belly (abdominal) pain that: ? Gets worse. ? Stays in one area (localizes).  You have a rash.  You have a stiff neck.  You get angry or annoyed more easily than normal (irritability).  You are  more sleepy than normal.  You have a harder time waking up than normal.  You feel: ? Weak. ? Dizzy. ? Very thirsty. Get help right away if:  You have symptoms of very bad dehydration.  You cannot drink fluids without throwing up (vomiting).  Your symptoms get worse with treatment.  You have a fever.  You have a very bad headache.  You are throwing up or having watery poop (diarrhea) and it: ? Gets worse. ? Does not go away.  You have diarrhea for more than 24 hours.  You have blood or something green (bile) in your throw-up.  You have blood in your poop (stool). This may cause poop to look black and tarry.  You have not peed in 6-8 hours.  You have peed (urinated) only a small amount of very dark pee during 6-8 hours.  You pass out (faint).  Your heart rate when you are sitting still is more than 100 beats a minute.  You have trouble breathing. This information is not intended to replace advice given to you by your health care provider. Make sure you discuss any questions you have with your health care provider. Document Released: 08/17/2011 Document Revised: 03/17/2016 Document Reviewed: 10/22/2015 Elsevier Interactive Patient Education  [employer].

## 2017-04-04 NOTE — Progress Notes (Signed)
[facility]  PATIENT: Christy Sutton DOB: 18-Nov-1925   REASON FOR VISIT: Hospital follow-up for stroke HISTORY FROM: Patient and daughter-in-law    HISTORY OF PRESENT ILLNESS:[place] an 83 y.o.female who woke up the morning of 03/07/2017 at her baseline. She has history of 2 prior strokes in April as well as May 2018 from atrial fibrillation despite being on eliquis. The patient apparently ate breakfast 03/07/2017 morning and was fine but at around 9 AM noticed sudden onset of left-sided weakness and incoordination. She did not call for help immediately and waited to see if symptoms improve. Later on as she did not feel better, and EMS were called and code stroke was activated en route. Upon arrival patient was met by the stroke team and noted to have left arm clumsiness and left leg weakness. CT scan of the head was obtained emergently which showed new area of acute cerebral infarction involving the RIGHT lateral thalamus and posterior limb internal capsule, adjacent to the area of previous ischemia on the Jan 17, 2017 scan. Patient's NIHstroke scale on admission was felt to be 3 by RN but by Dr. Clydene Fake documentation it was 4.  Patient states she has taken her dose of eliquis the morning of 03/07/2017. Review of patient's medical records revealed that she had a right posterior limb internal capsule infarct adjacent to the calcific area in the right parietal white matter during the last admission a month ago. Eliquis was continued. She made good physical recovery and was able to life at home with close supervision by her family. LSN : 9 am 03/07/17 NIHSS 4 Patient was not administered IV t-PA secondary to anticoagulation with eliquis. She was admitted to General Neurology for further evaluation and treatment. MRI of the brain area of acute cerebral infarction involving the right lateral thalamus and posterior limb internal capsule adjacent to the area of previous  ischemia on 01/17/2017 scan. MRA of the head no proximal vascular stenosis or occlusion. CT angiogram head and neck, Large vessel occlusion and diffuse atherosclerosis. 2-D echo with severely reduced LV emptying velocities LDL 63 hemoglobin A1c 6 eliquis was switched to Pradaxa. She was discharged to inpatient rehabilitation Interval history 04/04/17 CM patient returns to the stroke clinic for hospital follow-up. She just was discharged from inpatient rehabilitation 4 days ago. She is currently on aspirin and Pradaxa for secondary stroke prevention and atrial fibrillation. She has not had further stroke or TIA symptoms since discharge. She claims she has improvement in activity tolerance and improve functional skills with rehabilitation. She is to start home health occupational therapy and physical therapy in the next few days. She is ambulating in the home with a walker. She remains on Lipitor without complaints of myalgias. She has poor fluid intake. B/P in the office today 105/60 She returns for reevaluation  REVIEW OF SYSTEMS: Full 14 system review of systems performed and notable only for those listed, all others are neg:  Constitutional: neg  Cardiovascular: neg Ear/Nose/Throat: neg  Skin: neg Eyes: neg Respiratory: neg Gastroitestinal: neg  Hematology/Lymphatic: neg  Endocrine: neg Musculoskeletal:neg Allergy/Immunology: neg Neurological: Left-sided weakness Psychiatric: neg Sleep : neg   ALLERGIES: Allergies  Allergen Reactions  . Penicillins Hives    Has patient had a PCN reaction causing immediate rash, facial/tongue/throat swelling, SOB or lightheadedness with hypotension: Yes Has patient had a PCN reaction causing severe rash involving mucus membranes or skin necrosis: Unknown Has patient had a PCN reaction that required hospitalization: Unknown Has patient had  a PCN reaction occurring within the last 10 years: No If all of the above answers are "NO", then may proceed with  Cephalosporin use.     HOME MEDICATIONS: Outpatient Medications Prior to Visit  Medication Sig Dispense Refill  . aspirin 81 MG chewable tablet Chew 81 mg by mouth every evening.     Marland Kitchen atenolol (TENORMIN) 25 MG tablet Take 1 tablet (25 mg total) by mouth daily. 30 tablet 0  . atorvastatin (LIPITOR) 20 MG tablet Take 1 tablet (20 mg total) by mouth daily at 6 PM. 30 tablet 0  . dabigatran (PRADAXA) 150 MG CAPS capsule Take 1 capsule (150 mg total) by mouth every 12 (twelve) hours. 60 capsule 0   No facility-administered medications prior to visit.     PAST MEDICAL HISTORY: Past Medical History:  Diagnosis Date  . Atrial fibrillation ([place])   . Diabetes mellitus without complication ([place])   . Hyperlipidemia   . Hypertension   . Stroke ([place])    x 3  . TIA (transient ischemic attack)     PAST SURGICAL HISTORY: Past Surgical History:  Procedure Laterality Date  . ABDOMINAL HYSTERECTOMY     due to tubal pregnancies  . TEE WITHOUT CARDIOVERSION N/A 01/30/2017   Procedure: TRANSESOPHAGEAL ECHOCARDIOGRAM (TEE);  Surgeon: Adrian Prows, MD;  Location: [place];  Service: Cardiovascular;  Laterality: N/A;  . TUBAL LIGATION      FAMILY HISTORY: Family History  Problem Relation Age of Onset  . Heart attack Father   . Stroke Father   . Stroke Mother   . Stroke Sister   . Stroke Sister     SOCIAL HISTORY: Social History   Social History  . Marital status: Widowed    Spouse name: N/A  . Number of children: 2  . Years of education: 12   Occupational History  .      NA   Social History Main Topics  . Smoking status: Never Smoker  . Smokeless tobacco: Never Used  . Alcohol use No  . Drug use: No  . Sexual activity: Not on file   Other Topics Concern  . Not on file   Social History Narrative   Lives with grandson   Caffeine- 12 oz Pepsi, coffee 1/2 cup     PHYSICAL EXAM  Vitals:   04/04/17 1342  BP: 105/60  Pulse: 86  Weight: 123 lb 3.2 oz (55.9 kg)    Height: 5' 3"  (1.6 m)   Body mass index is 21.82 kg/m.  Generalized: Well developed, in no acute distress  Head: normocephalic and atraumatic,. Oropharynx Dry mucous membranes Neck: Supple, no carotid bruits  Cardiac: Regular rate rhythm, no murmur  Musculoskeletal: No deformity  Skin turgor is poor  Neurological examination   Mentation: Alert oriented to time, place, history taking. Attention span and concentration appropriate. Recent and remote memory intact.  Follows all commands speech and language fluent.   Cranial nerve II-XII: Pupils were equal round reactive to light extraocular movements were full, visual field were full on confrontational test. Facial sensation and strength were normal. hearing was intact to finger rubbing bilaterally. Uvula tongue midline. head turning and shoulder shrug were normal and symmetric.Tongue protrusion into cheek strength was normal. Motor: normal bulk and tone, full strength in the BUE, BLE,on the right 4 out of 5 left upper and lower extremity Sensory: normal and symmetric to light touch, pinprick, and  Vibration, proprioception  Coordination: finger-nose-finger, normal on the right difficulty on the left with  impaired coordination Reflexes: Symmetric upper and lower, plantar responses were flexor bilaterally. Gait and Station: In wheelchair gait not tested due to safety concerns  DIAGNOSTIC DATA (LABS, IMAGING, TESTING) - I reviewed patient records, labs, notes, testing and imaging myself where available.  Lab Results  Component Value Date   WBC 8.6 03/26/2017   HGB 12.3 03/26/2017   HCT 37.4 03/26/2017   MCV 91.2 03/26/2017   PLT 166 03/26/2017      Component Value Date/Time   NA 139 03/26/2017 1236   K 4.8 03/26/2017 1236   CL 106 03/26/2017 1236   CO2 25 03/26/2017 1236   GLUCOSE 206 (H) 03/26/2017 1236   BUN 16 03/26/2017 1236   CREATININE 0.82 03/26/2017 1236   CALCIUM 9.1 03/26/2017 1236   PROT 6.2 (L) 03/26/2017 1236    ALBUMIN 3.4 (L) 03/26/2017 1236   AST 32 03/26/2017 1236   ALT 15 03/26/2017 1236   ALKPHOS 45 03/26/2017 1236   BILITOT 1.1 03/26/2017 1236   GFRNONAA >60 03/26/2017 1236   GFRAA >60 03/26/2017 1236   Lab Results  Component Value Date   CHOL 146 01/18/2017   HDL 68 01/18/2017   LDLCALC 63 01/18/2017   TRIG 75 01/18/2017   CHOLHDL 2.1 01/18/2017   Lab Results  Component Value Date   HGBA1C 6.0 (H) 01/18/2017    ASSESSMENT AND PLAN  83 y.o. year old female  has a past medical history of Atrial fibrillation ([place]); Diabetes mellitus without complication ([place]); Hyperlipidemia; Hypertension; Stroke [facility]); and TIA (transient ischemic attack). here for hospital follow-up for stroke.The patient is a current patient of Dr. Leonie Man  who is out of the office today . This note is sent to the work in doctor.     PLAN: Stressed the importance of management of risk factors to prevent further stroke Continue Pradaxa and aspirin for secondary stroke prevention and atrial fibrillation Maintain strict control of hypertension with blood pressure goal below 130/90, today's reading 105/60 continue antihypertensive medications Control of diabetes with hemoglobin A1c below 6.5 followed by primary care most recent hemoglobin A1c6 Cholesterol with LDL cholesterol less than 70, followed by primary care,  most recent 63 continue statin drugs Lipitor Continue physical therapy occupational therapy at home, perform home exercise program  eat healthy diet with whole grains,  fresh fruits and vegetables Given information on dehydration need to increase intake Follow-up in 4 months Discussed risk for recurrent stroke/ TIA and answered additional questions This was a prolonged visit requiring 30 minutes and medical decision making of high complexity with extensive review of history, hospital chart, counseling and answering questions Dennie Bible, [facility], [facility], APRN  [facility] [address] [place],  [address] [phone]

## 2017-04-05 DIAGNOSIS — I4891 Unspecified atrial fibrillation: Secondary | ICD-10-CM | POA: Diagnosis not present

## 2017-04-05 DIAGNOSIS — D649 Anemia, unspecified: Secondary | ICD-10-CM | POA: Diagnosis not present

## 2017-04-05 DIAGNOSIS — E785 Hyperlipidemia, unspecified: Secondary | ICD-10-CM | POA: Diagnosis not present

## 2017-04-05 DIAGNOSIS — I10 Essential (primary) hypertension: Secondary | ICD-10-CM | POA: Diagnosis not present

## 2017-04-05 DIAGNOSIS — I69354 Hemiplegia and hemiparesis following cerebral infarction affecting left non-dominant side: Secondary | ICD-10-CM | POA: Diagnosis not present

## 2017-04-05 DIAGNOSIS — E119 Type 2 diabetes mellitus without complications: Secondary | ICD-10-CM | POA: Diagnosis not present

## 2017-04-09 DIAGNOSIS — I10 Essential (primary) hypertension: Secondary | ICD-10-CM | POA: Diagnosis not present

## 2017-04-09 DIAGNOSIS — I4891 Unspecified atrial fibrillation: Secondary | ICD-10-CM | POA: Diagnosis not present

## 2017-04-09 DIAGNOSIS — D649 Anemia, unspecified: Secondary | ICD-10-CM | POA: Diagnosis not present

## 2017-04-09 DIAGNOSIS — E785 Hyperlipidemia, unspecified: Secondary | ICD-10-CM | POA: Diagnosis not present

## 2017-04-09 DIAGNOSIS — E119 Type 2 diabetes mellitus without complications: Secondary | ICD-10-CM | POA: Diagnosis not present

## 2017-04-09 DIAGNOSIS — I69354 Hemiplegia and hemiparesis following cerebral infarction affecting left non-dominant side: Secondary | ICD-10-CM | POA: Diagnosis not present

## 2017-04-16 DIAGNOSIS — H9193 Unspecified hearing loss, bilateral: Secondary | ICD-10-CM | POA: Diagnosis not present

## 2017-04-16 DIAGNOSIS — R2681 Unsteadiness on feet: Secondary | ICD-10-CM | POA: Diagnosis not present

## 2017-04-16 DIAGNOSIS — E784 Other hyperlipidemia: Secondary | ICD-10-CM | POA: Diagnosis not present

## 2017-04-16 DIAGNOSIS — E1151 Type 2 diabetes mellitus with diabetic peripheral angiopathy without gangrene: Secondary | ICD-10-CM | POA: Diagnosis not present

## 2017-04-16 DIAGNOSIS — I10 Essential (primary) hypertension: Secondary | ICD-10-CM | POA: Diagnosis not present

## 2017-04-16 DIAGNOSIS — Z6823 Body mass index (BMI) 23.0-23.9, adult: Secondary | ICD-10-CM | POA: Diagnosis not present

## 2017-04-16 DIAGNOSIS — E038 Other specified hypothyroidism: Secondary | ICD-10-CM | POA: Diagnosis not present

## 2017-04-16 DIAGNOSIS — I480 Paroxysmal atrial fibrillation: Secondary | ICD-10-CM | POA: Diagnosis not present

## 2017-04-16 DIAGNOSIS — G8190 Hemiplegia, unspecified affecting unspecified side: Secondary | ICD-10-CM | POA: Diagnosis not present

## 2017-04-20 ENCOUNTER — Observation Stay (HOSPITAL_COMMUNITY)
Admission: EM | Admit: 2017-04-20 | Discharge: 2017-04-21 | Disposition: A | Discharge status: Home / Self Care | Payer: Medicare Other | Attending: Internal Medicine | Admitting: Internal Medicine

## 2017-04-20 ENCOUNTER — Encounter (HOSPITAL_COMMUNITY): Payer: Self-pay | Admitting: *Deleted

## 2017-04-20 ENCOUNTER — Emergency Department (HOSPITAL_COMMUNITY): Payer: Medicare Other

## 2017-04-20 DIAGNOSIS — I69354 Hemiplegia and hemiparesis following cerebral infarction affecting left non-dominant side: Secondary | ICD-10-CM | POA: Diagnosis not present

## 2017-04-20 DIAGNOSIS — G459 Transient cerebral ischemic attack, unspecified: Secondary | ICD-10-CM | POA: Diagnosis not present

## 2017-04-20 DIAGNOSIS — E876 Hypokalemia: Secondary | ICD-10-CM | POA: Diagnosis present

## 2017-04-20 DIAGNOSIS — J810 Acute pulmonary edema: Secondary | ICD-10-CM | POA: Diagnosis not present

## 2017-04-20 DIAGNOSIS — E785 Hyperlipidemia, unspecified: Secondary | ICD-10-CM | POA: Insufficient documentation

## 2017-04-20 DIAGNOSIS — Z823 Family history of stroke: Secondary | ICD-10-CM | POA: Diagnosis not present

## 2017-04-20 DIAGNOSIS — I119 Hypertensive heart disease without heart failure: Secondary | ICD-10-CM | POA: Insufficient documentation

## 2017-04-20 DIAGNOSIS — J90 Pleural effusion, not elsewhere classified: Secondary | ICD-10-CM | POA: Diagnosis not present

## 2017-04-20 DIAGNOSIS — I493 Ventricular premature depolarization: Secondary | ICD-10-CM | POA: Insufficient documentation

## 2017-04-20 DIAGNOSIS — M6281 Muscle weakness (generalized): Secondary | ICD-10-CM | POA: Insufficient documentation

## 2017-04-20 DIAGNOSIS — I482 Chronic atrial fibrillation, unspecified: Secondary | ICD-10-CM | POA: Diagnosis present

## 2017-04-20 DIAGNOSIS — R9431 Abnormal electrocardiogram [ECG] [EKG]: Secondary | ICD-10-CM | POA: Diagnosis not present

## 2017-04-20 DIAGNOSIS — E46 Unspecified protein-calorie malnutrition: Secondary | ICD-10-CM | POA: Diagnosis not present

## 2017-04-20 DIAGNOSIS — Z880 Allergy status to penicillin: Secondary | ICD-10-CM | POA: Insufficient documentation

## 2017-04-20 DIAGNOSIS — I10 Essential (primary) hypertension: Secondary | ICD-10-CM | POA: Diagnosis present

## 2017-04-20 DIAGNOSIS — E1169 Type 2 diabetes mellitus with other specified complication: Secondary | ICD-10-CM | POA: Diagnosis present

## 2017-04-20 DIAGNOSIS — E119 Type 2 diabetes mellitus without complications: Secondary | ICD-10-CM | POA: Diagnosis not present

## 2017-04-20 DIAGNOSIS — R600 Localized edema: Secondary | ICD-10-CM | POA: Diagnosis present

## 2017-04-20 DIAGNOSIS — I700 Atherosclerosis of aorta: Secondary | ICD-10-CM | POA: Insufficient documentation

## 2017-04-20 DIAGNOSIS — Z8249 Family history of ischemic heart disease and other diseases of the circulatory system: Secondary | ICD-10-CM | POA: Diagnosis not present

## 2017-04-20 DIAGNOSIS — G8194 Hemiplegia, unspecified affecting left nondominant side: Secondary | ICD-10-CM | POA: Diagnosis present

## 2017-04-20 DIAGNOSIS — Z7982 Long term (current) use of aspirin: Secondary | ICD-10-CM | POA: Diagnosis not present

## 2017-04-20 DIAGNOSIS — Z66 Do not resuscitate: Secondary | ICD-10-CM | POA: Diagnosis not present

## 2017-04-20 DIAGNOSIS — I639 Cerebral infarction, unspecified: Secondary | ICD-10-CM | POA: Diagnosis present

## 2017-04-20 DIAGNOSIS — R609 Edema, unspecified: Secondary | ICD-10-CM | POA: Diagnosis present

## 2017-04-20 DIAGNOSIS — R531 Weakness: Secondary | ICD-10-CM | POA: Diagnosis not present

## 2017-04-20 DIAGNOSIS — Z7902 Long term (current) use of antithrombotics/antiplatelets: Secondary | ICD-10-CM | POA: Diagnosis not present

## 2017-04-20 DIAGNOSIS — I63411 Cerebral infarction due to embolism of right middle cerebral artery: Secondary | ICD-10-CM | POA: Diagnosis not present

## 2017-04-20 LAB — COMPREHENSIVE METABOLIC PANEL
ALBUMIN: 3.6 g/dL (ref 3.5–5.0)
ALK PHOS: 44 U/L (ref 38–126)
ALT: 13 U/L — ABNORMAL LOW (ref 14–54)
AST: 29 U/L (ref 15–41)
Anion gap: 7 (ref 5–15)
BILIRUBIN TOTAL: 1.3 mg/dL — AB (ref 0.3–1.2)
BUN: 6 mg/dL (ref 6–20)
CALCIUM: 9.1 mg/dL (ref 8.9–10.3)
CO2: 26 mmol/L (ref 22–32)
CREATININE: 0.77 mg/dL (ref 0.44–1.00)
Chloride: 109 mmol/L (ref 101–111)
GFR calc Af Amer: >60 mL/min (ref >60)
GFR calc non Af Amer: >60 mL/min (ref >60)
GLUCOSE: 113 mg/dL — AB (ref 65–99)
Potassium: 3.3 mmol/L — ABNORMAL LOW (ref 3.5–5.1)
Sodium: 142 mmol/L (ref 135–145)
TOTAL PROTEIN: 6.1 g/dL — AB (ref 6.5–8.1)

## 2017-04-20 LAB — PROTIME-INR
INR: 1.81
Prothrombin Time: 21.2 seconds — ABNORMAL HIGH (ref 11.4–15.2)

## 2017-04-20 LAB — I-STAT CHEM 8, ED
BUN: 4 mg/dL — AB (ref 6–20)
CALCIUM ION: 1.04 mmol/L — AB (ref 1.15–1.40)
CREATININE: 0.6 mg/dL (ref 0.44–1.00)
Chloride: 106 mmol/L (ref 101–111)
GLUCOSE: 111 mg/dL — AB (ref 65–99)
HEMATOCRIT: 32 % — AB (ref 36.0–46.0)
Hemoglobin: 10.9 g/dL — ABNORMAL LOW (ref 12.0–15.0)
Potassium: 3.2 mmol/L — ABNORMAL LOW (ref 3.5–5.1)
Sodium: 141 mmol/L (ref 135–145)
TCO2: 23 mmol/L (ref 0–100)

## 2017-04-20 LAB — I-STAT TROPONIN, ED: Troponin i, poc: 0 ng/mL (ref 0.00–0.08)

## 2017-04-20 LAB — CBC
HEMATOCRIT: 32.3 % — AB (ref 36.0–46.0)
HEMOGLOBIN: 11.1 g/dL — AB (ref 12.0–15.0)
MCH: 30.7 pg (ref 26.0–34.0)
MCHC: 34.4 g/dL (ref 30.0–36.0)
MCV: 89.2 fL (ref 78.0–100.0)
Platelets: 144 10*3/uL — ABNORMAL LOW (ref 150–400)
RBC: 3.62 MIL/uL — AB (ref 3.87–5.11)
RDW: 13.7 % (ref 11.5–15.5)
WBC: 5.7 10*3/uL (ref 4.0–10.5)

## 2017-04-20 LAB — DIFFERENTIAL
BASOS ABS: 0 10*3/uL (ref 0.0–0.1)
Basophils Relative: 0 %
Eosinophils Absolute: 0.1 10*3/uL (ref 0.0–0.7)
Eosinophils Relative: 2 %
LYMPHS ABS: 0.8 10*3/uL (ref 0.7–4.0)
LYMPHS PCT: 14 %
MONOS PCT: 7 %
Monocytes Absolute: 0.4 10*3/uL (ref 0.1–1.0)
NEUTROS PCT: 77 %
Neutro Abs: 4.3 10*3/uL (ref 1.7–7.7)

## 2017-04-20 LAB — APTT: APTT: 74 s — AB (ref 24–36)

## 2017-04-20 LAB — CBG MONITORING, ED: Glucose-Capillary: 119 mg/dL — ABNORMAL HIGH (ref 65–99)

## 2017-04-20 LAB — BRAIN NATRIURETIC PEPTIDE: B Natriuretic Peptide: 335.1 pg/mL — ABNORMAL HIGH (ref 0.0–100.0)

## 2017-04-20 MED ORDER — ACETAMINOPHEN 650 MG RE SUPP: 650.0000 mg | Freq: Four times a day (QID) | RECTAL | Status: DC | PRN

## 2017-04-20 MED ORDER — ACETAMINOPHEN 160 MG/5ML PO SOLN: 650.0000 mg | Freq: Four times a day (QID) | ORAL | Status: DC | PRN

## 2017-04-20 MED ORDER — ACETAMINOPHEN 325 MG PO TABS: 650.0000 mg | ORAL_TABLET | ORAL | Status: DC | PRN

## 2017-04-20 MED ORDER — ACETAMINOPHEN 160 MG/5ML PO SOLN: 650.0000 mg | ORAL | Status: DC | PRN

## 2017-04-20 MED ORDER — ONDANSETRON HCL 4 MG/2ML IJ SOLN: 4.0000 mg | Freq: Three times a day (TID) | INTRAMUSCULAR | Status: DC | PRN

## 2017-04-20 MED ORDER — HYDRALAZINE HCL 20 MG/ML IJ SOLN: 5.0000 mg | INTRAMUSCULAR | Status: DC | PRN

## 2017-04-20 MED ORDER — ASPIRIN 81 MG PO CHEW: 81.0000 mg | CHEWABLE_TABLET | Freq: Every evening | ORAL | Status: DC

## 2017-04-20 MED ORDER — DABIGATRAN ETEXILATE MESYLATE 150 MG PO CAPS
150.0000 mg | ORAL_CAPSULE | Freq: Two times a day (BID) | ORAL | Status: DC
  Administered 2017-04-21: 150 mg via ORAL
  Filled 2017-04-20 (×2): qty 1

## 2017-04-20 MED ORDER — STROKE: EARLY STAGES OF RECOVERY BOOK
Freq: Once | Status: AC
  Administered 2017-04-21: 06:00:00
  Filled 2017-04-20: qty 1

## 2017-04-20 MED ORDER — SENNOSIDES-DOCUSATE SODIUM 8.6-50 MG PO TABS
1.0000 | ORAL_TABLET | Freq: Every evening | ORAL | Status: DC | PRN
  Filled 2017-04-20: qty 1

## 2017-04-20 MED ORDER — ADULT MULTIVITAMIN W/MINERALS CH
1.0000 | ORAL_TABLET | Freq: Every day | ORAL | Status: DC
  Administered 2017-04-21: 1 via ORAL
  Filled 2017-04-20: qty 1

## 2017-04-20 MED ORDER — ACETAMINOPHEN 650 MG RE SUPP
650.0000 mg | RECTAL | Status: DC | PRN
Start: 2017-04-20 — End: 2017-04-20

## 2017-04-20 MED ORDER — FUROSEMIDE 10 MG/ML IJ SOLN
20.0000 mg | Freq: Every day | INTRAMUSCULAR | Status: DC
  Administered 2017-04-21: 20 mg via INTRAVENOUS
  Filled 2017-04-20: qty 4

## 2017-04-20 MED ORDER — ZOLPIDEM TARTRATE 5 MG PO TABS: 5.0000 mg | ORAL_TABLET | Freq: Every evening | ORAL | Status: DC | PRN

## 2017-04-20 MED ORDER — ATENOLOL 25 MG PO TABS
25.0000 mg | ORAL_TABLET | Freq: Every day | ORAL | Status: DC
  Administered 2017-04-21: 25 mg via ORAL
  Filled 2017-04-20: qty 1

## 2017-04-20 MED ORDER — POTASSIUM CHLORIDE 20 MEQ/15ML (10%) PO SOLN: 20.0000 meq | Freq: Once | ORAL | Status: DC

## 2017-04-20 MED ORDER — MECLIZINE HCL 12.5 MG PO TABS: 25.0000 mg | ORAL_TABLET | Freq: Every day | ORAL | Status: DC | PRN

## 2017-04-20 MED ORDER — ATORVASTATIN CALCIUM 10 MG PO TABS
20.0000 mg | ORAL_TABLET | Freq: Every day | ORAL | Status: DC
  Filled 2017-04-20: qty 1

## 2017-04-20 MED ORDER — ACETAMINOPHEN 325 MG PO TABS: 650.0000 mg | ORAL_TABLET | Freq: Four times a day (QID) | ORAL | Status: DC | PRN

## 2017-04-20 NOTE — ED Triage Notes (Addendum)
Per family patient has had 3 strokes in the past with each one affecting her left side.  Her last stroke was on 6/27 and affected left side, been having PT and strength improved.  LSN last pm 2200. Pt then this am PT found the patient had swelling to left ankle, left foot drop, foot feels cold, and has heavy feeling in left arm and leg. Pt is on pradaxa.  Dopplered DPP on left and marked spot (sounds weak with some intermittence).

## 2017-04-20 NOTE — ED Notes (Signed)
Patient is stable and ready to be transport to the floor at this time.  Report was called to 5M RN.  Belongings taken with the patient to the floor.   

## 2017-04-20 NOTE — H&P (Addendum)
History and Physical    Christy Sutton:[id number] DOB: 11-01-1925 DOA: 04/20/2017  Referring Sutton/NP/PA:   PCP: Christy Sutton   Patient coming from:  The patient is coming from home.  At baseline, pt is independent for most of ADL.   Chief Complaint: Worsening Left-sided weakness, bilateral leg edema  HPI: Christy Sutton is a 83 y.o. female with medical history significant of hypertension, hyperlipidemia, diet-controlled diabetes mellitus, stroke, TIA, atrial fibrillation on Pradaxa, who presents with worsening left-sided weakness.  Patient states that she had a previous stroke in the past, the last one was on 03/07/17, left her with sequela of left-sided weakness. She has been doing physical therapy, her muscle strength had been improving. Patient states that today she has worsening left-sided weakness in her arms and legs. She feels "drop". Patient does not have vison change, hearing loss, facial droop or slurred speech. She also has bilateral lower leg edema, feeling of foot cold. Patient does not have shortness breath, cough, chest pain. No fever or chills. Denies nausea, vomiting, diarrhea, abdominal pain, symptoms of UTI.   ED Course: pt was found to have BNP 335, WBC 5.7, INR 1.81, PTT 74, negative troponin, potassium 3.3, creatinine normal, temperature normal, oxygen saturation 98% on room air, chest x-ray showed bilateral trace amount of pleural effusion. CT head is negative for acute intracranial abnormalities. Patient is placed on telemetry bed for observation. Neurology, Dr. Rory Percy was consulted by EDP.  Review of Systems:   General: no fevers, chills, no body weight gain, has fatigue HEENT: no blurry vision, hearing changes or sore throat Respiratory: no dyspnea, coughing, wheezing CV: no chest pain, no palpitations GI: no nausea, vomiting, abdominal pain, diarrhea, constipation GU: no dysuria, burning on urination, increased urinary frequency, hematuria  Ext: has leg  edema Neuro: has left sided weakness, no vision change or hearing loss Skin: no rash, no skin tear. MSK: No muscle spasm, no deformity, no limitation of range of movement in spin Heme: No easy bruising.  Travel history: No recent long distant travel.  Allergy:  Allergies  Allergen Reactions  . Penicillins Hives    Has patient had a PCN reaction causing immediate rash, facial/tongue/throat swelling, SOB or lightheadedness with hypotension: Yes Has patient had a PCN reaction causing severe rash involving mucus membranes or skin necrosis: Unknown Has patient had a PCN reaction that required hospitalization: Unknown Has patient had a PCN reaction occurring within the last 10 years: No If all of the above answers are "NO", then may proceed with Cephalosporin use.     Past Medical History:  Diagnosis Date  . Atrial fibrillation ([place])   . Diabetes mellitus without complication ([place])   . Hyperlipidemia   . Hypertension   . Stroke ([place])    x 3  . TIA (transient ischemic attack)     Past Surgical History:  Procedure Laterality Date  . ABDOMINAL HYSTERECTOMY     due to tubal pregnancies  . TEE WITHOUT CARDIOVERSION N/A 01/30/2017   Procedure: TRANSESOPHAGEAL ECHOCARDIOGRAM (TEE);  Surgeon: Christy Sutton;  Location: [place];  Service: Cardiovascular;  Laterality: N/A;  . TUBAL LIGATION      Social History:  reports that she has never smoked. She has never used smokeless tobacco. She reports that she does not drink alcohol or use drugs.  Family History:  Family History  Problem Relation Age of Onset  . Heart attack Father   . Stroke Father   . Stroke Mother   . Stroke Sister   .  Stroke Sister      Prior to Admission medications   Medication Sig Start Date End Date Taking? Authorizing Provider  aspirin 81 MG chewable tablet Chew 81 mg by mouth every evening.    Yes Provider, Historical, Sutton  atenolol (TENORMIN) 25 MG tablet Take 1 tablet (25 mg total) by mouth daily.  03/30/17  Yes Love, Ivan Anchors, PA-C  atorvastatin (LIPITOR) 20 MG tablet Take 1 tablet (20 mg total) by mouth daily at 6 PM. 03/30/17  Yes Love, Ivan Anchors, PA-C  dabigatran (PRADAXA) 150 MG CAPS capsule Take 1 capsule (150 mg total) by mouth every 12 (twelve) hours. 03/30/17  Yes Love, Ivan Anchors, PA-C  meclizine (ANTIVERT) 25 MG tablet Take 25 mg by mouth daily as needed for dizziness.   Yes Provider, Historical, Sutton  Multiple Vitamin (MULTIVITAMIN) tablet Take 1 tablet by mouth daily.   Yes Provider, Historical, Sutton    Physical Exam: Vitals:   04/20/17 1845 04/20/17 1930 04/20/17 1945 04/20/17 2000  BP: (!) 120/55 (!) 126/59 115/90 138/60  Pulse: 75 79 99 (!) 58  Resp: 17 (!) 21 (!) 21 15  Temp:      TempSrc:      SpO2: 99% 99% 98% 99%   General: Not in acute distress HEENT:       Eyes: PERRL, EOMI, no scleral icterus.       ENT: No discharge from the ears and nose, no pharynx injection, no tonsillar enlargement.        Neck: No JVD, no bruit, no mass felt. Heme: No neck lymph node enlargement. Cardiac: S1/S2, RRR, No murmurs, No gallops or rubs. Respiratory: No rales, wheezing, rhonchi or rubs. GI: Soft, nondistended, nontender, no rebound pain, no organomegaly, BS present. GU: No hematuria Ext: 2+ pitting leg edema bilaterally. Distal pulses were palpable bilaterally.Bilateral distal extremities cool from midfoot distally.    Musculoskeletal: No joint deformities, No joint redness or warmth, no limitation of ROM in spin. Skin: No rashes.  Neuro: Alert, oriented X3, cranial nerves II-XII grossly intact, moves all extremities normally. Muscle strength 3/5 in left leg and 4/5 in left arm; 5//5 in right extremities, sensation to light touch intact. Brachial reflex 2+ bilaterally. Knee reflex 1+ bilaterally. Negative Babinski's sign. Normal finger to nose test. Psych: Patient is not psychotic, no suicidal or hemocidal ideation.   Labs on Admission: I have personally reviewed following labs  and imaging studies  CBC:  Recent Labs Lab 04/20/17 1343 04/20/17 1353  WBC 5.7  --   NEUTROABS 4.3  --   HGB 11.1* 10.9*  HCT 32.3* 32.0*  MCV 89.2  --   PLT 144*  --    Basic Metabolic Panel:  Recent Labs Lab 04/20/17 1343 04/20/17 1353  NA 142 141  K 3.3* 3.2*  CL 109 106  CO2 26  --   GLUCOSE 113* 111*  BUN 6 4*  CREATININE 0.77 0.60  CALCIUM 9.1  --    GFR: CrCl cannot be calculated (Unknown ideal weight.). Liver Function Tests:  Recent Labs Lab 04/20/17 1343  AST 29  ALT 13*  ALKPHOS 44  BILITOT 1.3*  PROT 6.1*  ALBUMIN 3.6   No results for input(s): LIPASE, AMYLASE in the last 168 hours. No results for input(s): AMMONIA in the last 168 hours. Coagulation Profile:  Recent Labs Lab 04/20/17 1343  INR 1.81   Cardiac Enzymes: No results for input(s): CKTOTAL, CKMB, CKMBINDEX, TROPONINI in the last 168 hours. BNP (last 3 results) No  results for input(s): PROBNP in the last 8760 hours. HbA1C: No results for input(s): HGBA1C in the last 72 hours. CBG:  Recent Labs Lab 04/20/17 1319  GLUCAP 119*   Lipid Profile: No results for input(s): CHOL, HDL, LDLCALC, TRIG, CHOLHDL, LDLDIRECT in the last 72 hours. Thyroid Function Tests: No results for input(s): TSH, T4TOTAL, FREET4, T3FREE, THYROIDAB in the last 72 hours. Anemia Panel: No results for input(s): VITAMINB12, FOLATE, FERRITIN, TIBC, IRON, RETICCTPCT in the last 72 hours. Urine analysis:    Component Value Date/Time   COLORURINE YELLOW 03/26/2017 2040   APPEARANCEUR CLEAR 03/26/2017 2040   LABSPEC 1.015 03/26/2017 2040   PHURINE 5.0 03/26/2017 2040   GLUCOSEU NEGATIVE 03/26/2017 2040   HGBUR NEGATIVE 03/26/2017 2040   [place] NEGATIVE 03/26/2017 2040   [place] NEGATIVE 03/26/2017 2040   PROTEINUR NEGATIVE 03/26/2017 2040   NITRITE NEGATIVE 03/26/2017 2040   LEUKOCYTESUR SMALL (A) 03/26/2017 2040   Sepsis Labs: @LABRCNTIP (procalcitonin:4,lacticidven:4) )No results found for  this or any previous visit (from the past 240 hour(s)).   Radiological Exams on Admission: Dg Chest 2 View  Result Date: 04/20/2017 CLINICAL DATA:  New onset peripheral edema of the left ankle. EXAM: CHEST  2 VIEW COMPARISON:  03/26/2017 FINDINGS: Stable cardiomegaly with aortic atherosclerosis. No aneurysm. No acute pulmonary edema or pneumonic consolidation. Trace bilateral pleural effusions however are noted with blunting of the costophrenic angles. IMPRESSION: Trace bilateral pleural effusions. Aortic atherosclerosis. No overt pulmonary edema. Electronically Signed   By: Ashley Royalty M.D.   On: 04/20/2017 19:51   Ct Head Wo Contrast  Result Date: 04/20/2017 CLINICAL DATA:  Left lower extremity weakness. EXAM: CT HEAD WITHOUT CONTRAST TECHNIQUE: Contiguous axial images were obtained from the base of the skull through the vertex without intravenous contrast. COMPARISON:  CT scan of March 07, 2017. FINDINGS: Brain: Mild chronic ischemic white matter disease is noted. Stable calcifications are seen in the right thalamus consistent with old lacunar infarction with associated calcifications. No mass effect or midline shift is noted. Ventricular size is within normal limits. There is no evidence of mass lesion, hemorrhage or acute infarction. Vascular: No hyperdense vessel or unexpected calcification. Skull: Normal. Negative for fracture or focal lesion. Sinuses/Orbits: No acute finding. Other: None. IMPRESSION: Mild chronic ischemic white matter disease. No acute intracranial abnormality seen. Electronically Signed   By: Marijo Conception, M.D.   On: 04/20/2017 14:28     EKG: Independently reviewed.  Atrial fibrillation, QTC 446, PVC, early R-wave progression, T-wave inversion in V4-V6, and in inferior leads.   Assessment/Plan Principal Problem:   Stroke [facility]) Active Problems:   Essential hypertension   Hypokalemia   Left hemiparesis (HCC)   Hyperlipidemia   Bilateral leg edema   Atrial  fibrillation, chronic (HCC)   Left-sided weakness   Stroke [facility]): pt has history of stroke with left-sided weakness. Today patient has worsening left-sided weakness. Neurology consult to neurology was consulted, recommended to rule out infection first, including chest x-ray and urinalysis. If no infection, patient may need MRI for brain again. Chest x-rays negative for infiltration, but showed bilateral trace amount of pleural effusion.  - will place on tele bed for obs - f/u UA. If negative, may need to get MRI-brain - Continue aspirin, Lipitor, Pradaxa - fasting lipid panel and HbA1c  - PT/OT consult - swallowing screen  Addendum: pt does not have a fever, leukocytosis, chest x-rays negative for infiltration, urinalysis negative for UTI -will get MRI-brain to r/o new stroke.   Bilateral  leg edema: Etiology is not clear. Albumin normal. Pt is on Pradaxa and no calf tenderness, given the bilateral nature, less likely to have DVT. Potential differential diagnosis is CHF. Chest x-ray showed bilateral trace amount of pleural effusion, which is consistent with CHF. No obvious pulmonary edema on CXR. Patient does not have chest pain or SOB, no acute CHF exacerbation. 2-D echo on 03/17/17 showed EF 60-65%, was not able to evaluate diastolic dysfunction, patient may have undiagnosed diastolic CHF. BNP 335. -will start low dose lasix, 20 mg daily -continue ASA and atenolol -will not repeat 2-D echo since she just had one on 03/17/17.  HTN: -continue atenolol -IV hydralazine when necessary  Hypokalemia: K= 3.3 on admission. - Repleted - Check Mg level  HLD: -Lipitor  Atrial Fibrillation: CHA2DS2-VASc Score is 7, needs oral anticoagulation. Patient is on Pradaxa at home. Heart rate is well controlled. -continue Pradaxa and atenolol   DVT ppx: on Pradaxa Code Status: Full code Family Communication:  Yes, patient's son and daughter-in law  at bed side Disposition Plan:  Anticipate discharge  back to previous home environment Consults called:  Neuro, dr. Rory Percy Admission status: Obs / tele     Date of Service 04/20/2017    Ivor Costa [facility] Pager [phone]  If 7PM-7AM, please contact night-coverage [email or web address] Password [email or web address] 04/20/2017, 9:50 PM

## 2017-04-20 NOTE — ED Provider Notes (Signed)
[facility] DEPT Provider Note   CSN: [id number] Arrival date & time: 04/20/17  1314   History   Chief Complaint Chief Complaint  Patient presents with  . Weakness    HPI Christy Sutton is a 83 y.o. female who presented with acute onset of left sided distal lower extremity weakness. She has a past medical history significant for DMII, TIA's, strokes, HTN, A-fib, as well as residual left sided hemiparesis. Patient states that she noticed distal extremity swelling in her left ankle late yesterday evening but did not seem particularly concerned with it herself. She denied ever having had this previously but noticed that she was dragging her left foot more than usual. She denied increased left sided weakness other than the issues with her foot, and possible decreased ability to ambulate. Patient attested to feeling "bad" today which prompted her to refuse her PT/OT. Due to the cold feeling in her foot as well as the swelling the PT nurse requested to stop by and check vitals as well as evaluate her foot. To this she agreed, to which following a discussion between the nurse and the patients PCP they decided she should visit the ED for evaluation of the swelling and cold foot.   HPI  Past Medical History:  Diagnosis Date  . Atrial fibrillation ([place])   . Diabetes mellitus without complication ([place])   . Hyperlipidemia   . Hypertension   . Stroke ([place])    x 3  . TIA (transient ischemic attack)     Patient Active Problem List   Diagnosis Date Noted  . Bilateral leg edema 04/20/2017  . Atrial fibrillation, chronic ([place]) 04/20/2017  . Left-sided weakness 04/20/2017  . Hyperlipidemia 04/04/2017  . Left hemiparesis ([place])   . Acute blood loss anemia   . Benign essential HTN   . Hypokalemia   . Hypoalbuminemia due to protein-calorie malnutrition ([place])   . Stroke due to embolism ([place]) 03/09/2017  . Diabetes mellitus type 2 in nonobese ([facility])   . History of CVA (cerebrovascular accident)   .  Cerebral embolism with cerebral infarction 03/07/2017  . Stroke ([place]) 01/17/2017  . TIA (transient ischemic attack) 01/17/2017  . Dysmetria 01/17/2017  . Essential hypertension 01/17/2017  . Diabetes mellitus with complication ([place]) 41/32/4401  . Ischemic stroke [facility])     Past Surgical History:  Procedure Laterality Date  . ABDOMINAL HYSTERECTOMY     due to tubal pregnancies  . TEE WITHOUT CARDIOVERSION N/A 01/30/2017   Procedure: TRANSESOPHAGEAL ECHOCARDIOGRAM (TEE);  Surgeon: Adrian Prows, MD;  Location: [facility];  Service: Cardiovascular;  Laterality: N/A;  . TUBAL LIGATION      OB History    No data available       Home Medications    Prior to Admission medications   Medication Sig Start Date End Date Taking? Authorizing Provider  aspirin 81 MG chewable tablet Chew 81 mg by mouth every evening.    Yes [provider]  atenolol (TENORMIN) 25 MG tablet Take 1 tablet (25 mg total) by mouth daily. 03/30/17  Yes Love, Ivan Anchors, PA-C  atorvastatin (LIPITOR) 20 MG tablet Take 1 tablet (20 mg total) by mouth daily at 6 PM. 03/30/17  Yes Love, Ivan Anchors, PA-C  dabigatran (PRADAXA) 150 MG CAPS capsule Take 1 capsule (150 mg total) by mouth every 12 (twelve) hours. 03/30/17  Yes Love, Ivan Anchors, PA-C  meclizine (ANTIVERT) 25 MG tablet Take 25 mg by mouth daily as needed for dizziness.   Yes [provider]  Multiple Vitamin (MULTIVITAMIN) tablet Take 1 tablet by mouth daily.   Yes [provider]    Family History Family History  Problem Relation Age of Onset  . Heart attack Father   . Stroke Father   . Stroke Mother   . Stroke Sister   . Stroke Sister     Social History Social History  Substance Use Topics  . Smoking status: Never Smoker  . Smokeless tobacco: Never Used  . Alcohol use No    Allergies   Penicillins  Review of Systems Review of Systems  Constitutional: Positive for activity change. Negative for appetite change, chills, fatigue  and fever.  Gastrointestinal: Negative for abdominal distention, abdominal pain, constipation, diarrhea, nausea and vomiting.  Genitourinary: Negative for difficulty urinating.  Skin: Negative for color change, pallor, rash and wound.  Neurological: Positive for weakness (Residual left sided weakness). Negative for dizziness, seizures, facial asymmetry, speech difficulty, light-headedness, numbness and headaches.  Psychiatric/Behavioral: Positive for confusion. Negative for agitation. The patient is nervous/anxious.   All other systems reviewed and are negative.   Physical Exam Updated Vital Signs BP 138/60   Pulse (!) 58   Temp 98.9 F (37.2 C) (Oral)   Resp 15   SpO2 99%   Physical Exam  Constitutional: She is oriented to person, place, and time. She appears well-developed and well-nourished. No distress.  HENT:  Head: Normocephalic and atraumatic.  Mouth/Throat: No oropharyngeal exudate.  Eyes: Pupils are equal, round, and reactive to light. EOM are normal. No scleral icterus.  Pupils are dilated to 67mm  Neck: Normal range of motion. No tracheal deviation present.  Cardiovascular: Normal rate.  An irregularly irregular rhythm present. Exam reveals distant heart sounds.   No murmur heard. Distal pulses are decreased bilaterally with respect to the DP and PT.  Pulmonary/Chest: Effort normal and breath sounds normal. No respiratory distress. She has no wheezes. She exhibits no tenderness.  Abdominal: Soft. Bowel sounds are normal. She exhibits no distension. There is no tenderness. There is no guarding.  Musculoskeletal: She exhibits edema (Bilaterally at the level of the ankle distally). She exhibits no tenderness.  Neurological: She is alert and oriented to person, place, and time. No cranial nerve deficit.  Decreased strength bilaterally to 4/5 with 3/5 on the left upper and lower. No acute change from previous exam review.   Skin: Skin is dry. Capillary refill takes less than 2  seconds. She is not diaphoretic. No erythema.  Mid foot distally is cool to touch bilaterally, not significant asymmetry observed in edema (+1), temperature, pulse or color  Psychiatric: She has a normal mood and affect. Her behavior is normal.  Nursing note and vitals reviewed.    ED Treatments / Results  Labs (all labs ordered are listed, but only abnormal results are displayed) Labs Reviewed  PROTIME-INR - Abnormal; Notable for the following:       Result Value   Prothrombin Time 21.2 (*)    All other components within normal limits  APTT - Abnormal; Notable for the following:    aPTT 74 (*)    All other components within normal limits  CBC - Abnormal; Notable for the following:    RBC 3.62 (*)    Hemoglobin 11.1 (*)    HCT 32.3 (*)    Platelets 144 (*)    All other components within normal limits  COMPREHENSIVE METABOLIC PANEL - Abnormal; Notable for the following:    Potassium 3.3 (*)    Glucose,  Bld 113 (*)    Total Protein 6.1 (*)    ALT 13 (*)    Total Bilirubin 1.3 (*)    All other components within normal limits  BRAIN NATRIURETIC PEPTIDE - Abnormal; Notable for the following:    B Natriuretic Peptide 335.1 (*)    All other components within normal limits  CBG MONITORING, ED - Abnormal; Notable for the following:    Glucose-Capillary 119 (*)    All other components within normal limits  I-STAT CHEM 8, ED - Abnormal; Notable for the following:    Potassium 3.2 (*)    BUN 4 (*)    Glucose, Bld 111 (*)    Calcium, Ion 1.04 (*)    Hemoglobin 10.9 (*)    HCT 32.0 (*)    All other components within normal limits  DIFFERENTIAL  URINALYSIS, ROUTINE W REFLEX MICROSCOPIC  MAGNESIUM  HEMOGLOBIN A1C  LIPID PANEL  I-STAT TROPONIN, ED  CBG MONITORING, ED    EKG  EKG Interpretation  Date/Time:  Friday April 20 2017 13:25:18 EDT Ventricular Rate:  83 PR Interval:    QRS Duration: 74 QT Interval:  380 QTC Calculation: 446 R Axis:   73 Text Interpretation:   Atrial fibrillation with premature ventricular or aberrantly conducted complexes No significant change since last tracing Abnormal ECG Confirmed by Quintella Reichert [phone]) on 04/20/2017 6:18:39 PM       Radiology Dg Chest 2 View  Result Date: 04/20/2017 CLINICAL DATA:  New onset peripheral edema of the left ankle. EXAM: CHEST  2 VIEW COMPARISON:  03/26/2017 FINDINGS: Stable cardiomegaly with aortic atherosclerosis. No aneurysm. No acute pulmonary edema or pneumonic consolidation. Trace bilateral pleural effusions however are noted with blunting of the costophrenic angles. IMPRESSION: Trace bilateral pleural effusions. Aortic atherosclerosis. No overt pulmonary edema. Electronically Signed   By: Ashley Royalty M.D.   On: 04/20/2017 19:51   Ct Head Wo Contrast  Result Date: 04/20/2017 CLINICAL DATA:  Left lower extremity weakness. EXAM: CT HEAD WITHOUT CONTRAST TECHNIQUE: Contiguous axial images were obtained from the base of the skull through the vertex without intravenous contrast. COMPARISON:  CT scan of March 07, 2017. FINDINGS: Brain: Mild chronic ischemic white matter disease is noted. Stable calcifications are seen in the right thalamus consistent with old lacunar infarction with associated calcifications. No mass effect or midline shift is noted. Ventricular size is within normal limits. There is no evidence of mass lesion, hemorrhage or acute infarction. Vascular: No hyperdense vessel or unexpected calcification. Skull: Normal. Negative for fracture or focal lesion. Sinuses/Orbits: No acute finding. Other: None. IMPRESSION: Mild chronic ischemic white matter disease. No acute intracranial abnormality seen. Electronically Signed   By: Marijo Conception, M.D.   On: 04/20/2017 14:28    Procedures Procedures (including critical care time)  Medications Ordered in ED Medications  meclizine (ANTIVERT) tablet 25 mg (not administered)  multivitamin with minerals tablet 1 tablet (not administered)    atenolol (TENORMIN) tablet 25 mg (not administered)  atorvastatin (LIPITOR) tablet 20 mg (not administered)  dabigatran (PRADAXA) capsule 150 mg (not administered)  aspirin chewable tablet 81 mg (not administered)  potassium chloride 20 MEQ/15ML (10%) solution 20 mEq (not administered)   stroke: mapping our early stages of recovery book (not administered)  senna-docusate (Senokot-S) tablet 1 tablet (not administered)  hydrALAZINE (APRESOLINE) injection 5 mg (not administered)  ondansetron (ZOFRAN) injection 4 mg (not administered)  zolpidem (AMBIEN) tablet 5 mg (not administered)  acetaminophen (TYLENOL) tablet 650 mg (not administered)  Or  acetaminophen (TYLENOL) solution 650 mg (not administered)    Or  acetaminophen (TYLENOL) suppository 650 mg (not administered)  furosemide (LASIX) injection 20 mg (not administered)     Initial Impression / Assessment and Plan / ED Course  I have reviewed the triage vital signs and the nursing notes.  Pertinent labs & imaging results that were available during my care of the patient were reviewed by me and considered in my medical decision making (see chart for details).    Assessment: Christy Sutton is a 83 year old female who presented with acute onset of left lower distal extremity swelling beginning at the level of the ankle. Upon physical exam it is noted that she has mild bilateral lower extremity edema beginning at the level of the ankle. Distal pulses were palpable bilaterally with bilateral distal extremities being cool from midfoot distally.    Plan:  Continue current dabigatran 150mg  q12 hours as well as ASA 81mg . Atenolol 25mg  daily as well as her statin. Protime INR is 21.2, aPTT 74, CBC not changed from baseline, CMP demonstrated recurrent hypokalemia, with troponin's unremarkable. CT wo contrast of head did not demonstrate an acute intracranial bleed. Physical exam did not elicit acute changes as well.   Consulted Neurology who  suggested obtaining chest x-ray and UA to rule out acute infection prior to obtaining an MRI.   [facility] agreed to see patient and admit for CHF eval and treatment  Final Clinical Impressions(s) / ED Diagnoses   Final diagnoses:  Acute pulmonary edema ([place])  Peripheral edema    New Prescriptions New Prescriptions   No medications on file     Kathi Ludwig, MD 04/20/17 2201    Quintella Reichert, MD 04/22/17 0004

## 2017-04-21 ENCOUNTER — Observation Stay (HOSPITAL_COMMUNITY): Payer: Medicare Other

## 2017-04-21 DIAGNOSIS — I482 Chronic atrial fibrillation: Secondary | ICD-10-CM | POA: Diagnosis not present

## 2017-04-21 DIAGNOSIS — E876 Hypokalemia: Secondary | ICD-10-CM

## 2017-04-21 DIAGNOSIS — I639 Cerebral infarction, unspecified: Secondary | ICD-10-CM | POA: Diagnosis not present

## 2017-04-21 DIAGNOSIS — G458 Other transient cerebral ischemic attacks and related syndromes: Secondary | ICD-10-CM | POA: Diagnosis not present

## 2017-04-21 DIAGNOSIS — R600 Localized edema: Secondary | ICD-10-CM | POA: Diagnosis not present

## 2017-04-21 LAB — URINALYSIS, ROUTINE W REFLEX MICROSCOPIC
BILIRUBIN URINE: NEGATIVE
Glucose, UA: NEGATIVE mg/dL
Hgb urine dipstick: NEGATIVE
KETONES UR: 5 mg/dL — AB
Leukocytes, UA: NEGATIVE
NITRITE: NEGATIVE
PH: 5 (ref 5.0–8.0)
Protein, ur: NEGATIVE mg/dL
Specific Gravity, Urine: 1.011 (ref 1.005–1.030)

## 2017-04-21 LAB — LIPID PANEL
CHOL/HDL RATIO: 2.2 ratio
Cholesterol: 123 mg/dL (ref 0–200)
HDL: 57 mg/dL (ref >40)
LDL CALC: 53 mg/dL (ref 0–99)
TRIGLYCERIDES: 63 mg/dL (ref <150)
VLDL: 13 mg/dL (ref 0–40)

## 2017-04-21 LAB — MAGNESIUM: Magnesium: 1.6 mg/dL — ABNORMAL LOW (ref 1.7–2.4)

## 2017-04-21 MED ORDER — POTASSIUM CHLORIDE CRYS ER 20 MEQ PO TBCR
40.0000 meq | EXTENDED_RELEASE_TABLET | Freq: Once | ORAL | Status: AC
  Administered 2017-04-21: 40 meq via ORAL
  Filled 2017-04-21: qty 2

## 2017-04-21 NOTE — Evaluation (Addendum)
Occupational Therapy Evaluation Patient Details Name: Christy Sutton MRN: [id number] DOB: [birth year] Today's Date: 04/21/2017    History of Present Illness  Christy Sutton is a 83 y.o. female with medical history significant of hypertension, hyperlipidemia, diet-controlled diabetes mellitus, stroke, TIA, atrial fibrillation on Pradaxa, who presents with worsening left-sided weakness.  MRI negative for acute event.   Clinical Impression   PTA, pt was living with her grandson and able to participate in ADL with supervision from family. She reports that her L sided weakness has returned to baseline of residual weakness from previous CVA. On evaluation, pt was able to complete LB ADL and toilet transfers with min guard assist. She demonstrates good understanding of compensatory techniques to maximize independence with basic ADL tasks. Feel pt will be safe to D/C home with assistance from family and continued home health OT services. All further OT needs can be met via home health services and acute OT will sign off.     Follow Up Recommendations  Home health OT    Equipment Recommendations  None recommended by OT (has needs met)    Recommendations for Other Services       Precautions / Restrictions Precautions Precautions: Fall Precaution Comments: Impulsive, L hemiparesis Restrictions Weight Bearing Restrictions: No      Mobility Bed Mobility Overal bed mobility: Needs Assistance Bed Mobility: Sit to Supine       Sit to supine: Supervision   General bed mobility comments: Supervision for safety. Slow movement of L LE.   Transfers Overall transfer level: Needs assistance Equipment used: Rolling walker (2 wheeled) Transfers: Sit to/from Stand Sit to Stand: Supervision         General transfer comment: cued for hand placement    Balance Overall balance assessment: Needs assistance Sitting-balance support: Single extremity supported;Feet supported Sitting balance-Leahy Scale:  Good     Standing balance support: Bilateral upper extremity supported Standing balance-Leahy Scale: Poor Standing balance comment: Reliant on at least single UE support in standing.                            ADL either performed or assessed with clinical judgement   ADL Overall ADL's : Needs assistance/impaired Eating/Feeding: Set up;Sitting   Grooming: Supervision/safety;Standing   Upper Body Bathing: Sitting;Supervision/ safety   Lower Body Bathing: Min guard;Sit to/from stand   Upper Body Dressing : Sitting;Supervision/safety   Lower Body Dressing: Min guard;Sit to/from stand   Toilet Transfer: Min Lobbyist Details (indicate cue type and reason): Simulated  Toileting- Clothing Manipulation and Hygiene: Min guard;Sit to/from stand       Functional mobility during ADLs: Min guard;Rolling walker General ADL Comments: Pt with good understanding of hemi-dressing techniques. Talks herself through safe strategies for self-care.     Vision Patient Visual Report: No change from baseline Vision Assessment?: No apparent visual deficits Additional Comments: Not wearing glasses on eval     Perception     Praxis Praxis Praxis tested?: Within functional limits    Pertinent Vitals/Pain Pain Assessment: No/denies pain     Hand Dominance Right   Extremity/Trunk Assessment Upper Extremity Assessment Upper Extremity Assessment: LUE deficits/detail LUE Deficits / Details: Sensation in tact for tasks assessed. Full AROM with 4/5 strength grossly.    Lower Extremity Assessment Lower Extremity Assessment: Defer to PT evaluation   Cervical / Trunk Assessment Cervical / Trunk Assessment: Normal   Communication Communication Communication: HOH   Cognition Arousal/Alertness: Awake/alert Behavior  During Therapy: Impulsive Overall Cognitive Status: History of cognitive impairments - at baseline                                  General Comments: Pt at times is easily distracted. Able to follow commands well and converse. Perseveration noted concerning going home today.    General Comments  [place] present for second half of session and confirmed PLOF.     Exercises     Shoulder Instructions      Home Living Family/patient expects to be discharged to:: Private residence Living Arrangements: Other relatives (grandson) Available Help at Discharge: Family;Available 24 hours/day Type of Home: House Home Access: Stairs to enter [employer] of Steps: 6 Entrance Stairs-Rails: Right Home Layout: One level     Bathroom Shower/Tub: Occupational psychologist: Standard Bathroom Accessibility: Yes   Home Equipment: Environmental consultant - 2 wheels;Shower seat;Grab bars - tub/shower          Prior Functioning/Environment Level of Independence: Needs assistance  Gait / Transfers Assistance Needed: Uses RW for functional mobility. Family assists with ambulation up and down stairs, etc.  ADL's / Homemaking Assistance Needed: Reports that she completes dressing and bathing with supervision from family. Confirmed with grandson.            OT Problem List: Decreased strength;Impaired balance (sitting and/or standing);Decreased knowledge of precautions;Decreased safety awareness;Decreased cognition;Impaired UE functional use      OT Treatment/Interventions:      OT Goals(Current goals can be found in the care plan section) Acute Rehab OT Goals Patient Stated Goal: get back to working in her garden OT Goal Formulation: With patient/family  OT Frequency:     Barriers to D/C:            Co-evaluation              AM-PAC PT "6 Clicks" Daily Activity     Outcome Measure Help from another person eating meals?: None Help from another person taking care of personal grooming?: A Little Help from another person toileting, which includes using toliet, bedpan, or urinal?: A Little Help from another  person bathing (including washing, rinsing, drying)?: A Little Help from another person to put on and taking off regular upper body clothing?: A Little Help from another person to put on and taking off regular lower body clothing?: A Little 6 Click Score: 19   End of Session Equipment Utilized During Treatment: Rolling walker Nurse Communication: Mobility status  Activity Tolerance: Patient tolerated treatment well Patient left:  (ambulating in hallway with PT)  OT Visit Diagnosis: Other abnormalities of gait and mobility (R26.89);Hemiplegia and hemiparesis;Other symptoms and signs involving the nervous system (R29.898) Hemiplegia - Right/Left: Left Hemiplegia - dominant/non-dominant: Non-Dominant Hemiplegia - caused by: Cerebral infarction                Time: 1205-1225 OT Time Calculation (min): 20 min Charges:  OT General Charges $OT Visit: 1 Procedure OT Evaluation $OT Eval Moderate Complexity: 1 Procedure G-Codes: OT G-codes **NOT FOR INPATIENT CLASS** Functional Assessment Tool Used: Clinical judgement Functional Limitation: Self care Self Care Current Status (B3532): At least 1 percent but less than 20 percent impaired, limited or restricted Self Care Goal Status (D9242): At least 1 percent but less than 20 percent impaired, limited or restricted Self Care Discharge Status [phone]): At least 1 percent but less than 20 percent impaired, limited or restricted  Norman Herrlich, MS OTR/L  Pager: [phone]   Norman Herrlich 04/21/2017, 2:31 PM

## 2017-04-21 NOTE — Progress Notes (Signed)
OT Cancellation Note  Patient Details Name: Christy Sutton MRN: [id number] DOB: 15-Jun-1926   Cancelled Treatment:    Reason Eval/Treat Not Completed: Patient not medically ready. Pt with active bedrest orders. Will await increase in activity orders prior to initiating OT evaluation. Thank you for this referral!  Norman Herrlich, MS OTR/L  Pager: [place] [facility] [place] 04/21/2017, 10:08 AM

## 2017-04-21 NOTE — Progress Notes (Signed)
Patient given discharge instructions.  Family at bedside.  All questions and concerns addressed.  Patient left unit by wheelchair and all belongings.  Accompanied by staff.

## 2017-04-21 NOTE — Evaluation (Signed)
Physical Therapy Evaluation Patient Details Name: Layla Kesling MRN: [id number] DOB: 1925/09/24 Today's Date: 04/21/2017   History of Present Illness   Kimarie Coor is a 83 y.o. female with medical history significant of hypertension, hyperlipidemia, diet-controlled diabetes mellitus, stroke, TIA, atrial fibrillation on Pradaxa, who presents with worsening left-sided weakness.  MRI negative for acute event.  Clinical Impression  Pt is at or close to baseline functioning and should be safe at home with family assist.  Pt will benefit from continuing [place]. There are no further acute PT needs.  Will sign off at this time.     Follow Up Recommendations Home health PT    Equipment Recommendations  None recommended by PT    Recommendations for Other Services       Precautions / Restrictions Precautions Precautions: Fall Precaution Comments: Impulsive, L hemiparesis Restrictions Weight Bearing Restrictions: No      Mobility  Bed Mobility Overal bed mobility: Needs Assistance Bed Mobility: Sit to Supine       Sit to supine: Supervision   General bed mobility comments: some difficulty getting her L LE in bed, but needed no assist.  Boosted up to [facility] with ease.  Transfers Overall transfer level: Needs assistance Equipment used: Rolling walker (2 wheeled) Transfers: Sit to/from Stand Sit to Stand: Supervision         General transfer comment: cued for hand placement  Ambulation/Gait Ambulation/Gait assistance: Supervision Ambulation Distance (Feet): 200 Feet Assistive device: Rolling walker (2 wheeled) Gait Pattern/deviations: Step-through pattern   Gait velocity interpretation: at or above normal speed for age/gender General Gait Details: generally steady, with noticeable muscle fatige on L LE with distance.  Stairs Stairs: Yes Stairs assistance: Supervision Stair Management: No rails;Step to pattern;Forwards;Backwards Number of Stairs: 3 General stair comments:  safe with rail and stand by supervision/assist  Wheelchair Mobility    Modified Rankin (Stroke Patients Only) Modified Rankin (Stroke Patients Only) Modified Rankin: Slight disability     Balance Overall balance assessment: Needs assistance Sitting-balance support: Single extremity supported;Feet supported Sitting balance-Leahy Scale: Good     Standing balance support: Bilateral upper extremity supported Standing balance-Leahy Scale: Poor Standing balance comment: light use of the RW                             Pertinent Vitals/Pain Pain Assessment: No/denies pain    Home Living Family/patient expects to be discharged to:: Private residence Living Arrangements: Other relatives (grandson) Available Help at Discharge: Family;Available 24 hours/day Type of Home: House Home Access: Stairs to enter Entrance Stairs-Rails: Right Entrance Stairs-Number of Steps: 6 Home Layout: One level Home Equipment: Walker - 2 wheels;Shower seat;Grab bars - tub/shower      Prior Function Level of Independence: Needs assistance   Gait / Transfers Assistance Needed: Uses RW for functional mobility. Family assists with ambulation up and down stairs, etc.   ADL's / Homemaking Assistance Needed: Reports that she completes dressing and bathing with supervision from family.         Hand Dominance   Dominant Hand: Right    Extremity/Trunk Assessment        Lower Extremity Assessment Lower Extremity Assessment: Overall WFL for tasks assessed (L LE mildly weaker than right and fatigues more quickly)    Cervical / Trunk Assessment Cervical / Trunk Assessment: Normal  Communication   Communication: HOH  Cognition Arousal/Alertness: Awake/alert Behavior During Therapy: Impulsive Overall Cognitive Status: History of cognitive impairments - at  baseline                                        General Comments      Exercises     Assessment/Plan    PT  Assessment All further PT needs can be met in the next venue of care  PT Problem List Decreased strength;Decreased activity tolerance;Decreased coordination;Decreased mobility;Decreased balance       PT Treatment Interventions      PT Goals (Current goals can be found in the Care Plan section)       Frequency     Barriers to discharge        Co-evaluation               AM-PAC PT "6 Clicks" Daily Activity  Outcome Measure Difficulty turning over in bed (including adjusting bedclothes, sheets and blankets)?: A Little Difficulty moving from lying on back to sitting on the side of the bed? : A Little Difficulty sitting down on and standing up from a chair with arms (e.g., wheelchair, bedside commode, etc,.)?: A Little Help needed moving to and from a bed to chair (including a wheelchair)?: A Little Help needed walking in hospital room?: A Little Help needed climbing 3-5 steps with a railing? : A Little 6 Click Score: 18    End of Session   Activity Tolerance: Patient tolerated treatment well Patient left: in bed;with call bell/phone within reach;with family/visitor present Nurse Communication: Mobility status PT Visit Diagnosis: Other abnormalities of gait and mobility (R26.89);Other symptoms and signs involving the nervous system (R29.898)    Time: 9191-6606 PT Time Calculation (min) (ACUTE ONLY): 22 min   Charges:   PT Evaluation $PT Eval Low Complexity: 1 Low     PT G Codes:   PT G-Codes **NOT FOR INPATIENT CLASS** Functional Assessment Tool Used: AM-PAC 6 Clicks Basic Mobility;Clinical judgement Functional Limitation: Mobility: Walking and moving around Mobility: Walking and Moving Around Current Status (Y0459): At least 1 percent but less than 20 percent impaired, limited or restricted Mobility: Walking and Moving Around Goal Status [phone]): At least 1 percent but less than 20 percent impaired, limited or restricted Mobility: Walking and Moving Around Discharge  Status [phone]): At least 1 percent but less than 20 percent impaired, limited or restricted    04/21/2017  Donnella Sham, PT [phone]  (pager)  Tessie Fass [place] 04/21/2017, 1:06 PM

## 2017-04-21 NOTE — Discharge Summary (Signed)
Physician Discharge Summary  Christy Sutton DZH:[id number] DOB: 1925-10-15 DOA: 04/20/2017  PCP: Leanna Battles, MD  Admit date: 04/20/2017 Discharge date: 04/21/2017  Admitted From: home Disposition: home  Recommendations for Outpatient Follow-up:  1. Follow up with PCP in 1-2 weeks   Home Health:resume HHPT Equipment/Devices: none  Discharge Condition: fair CODE STATUS: DNR Diet recommendation: Heart Healthy    Discharge Diagnoses:  Principal Problem:   TIA (transient ischemic attack)  Active Problems:   Stroke [facility])   Essential hypertension   Hypokalemia   Left hemiparesis (HCC)   Hyperlipidemia   Bilateral leg edema   Atrial fibrillation, chronic (HCC)   Left-sided weakness  Brief narrative  83 y.o. female with medical history Of multiple strokes (last episode of right-sided embolic stroke  in June this year), A. fib on Pradaxa (was switched recently after 2 strokes while on eliquis ) hypertension, hyperlipidemia, diet-controlled diabetes mellitus,  who presented to the ED with worsening left-sided weakness. Patient was in the inpatient rehabilitation for several weeks and then discharged home recently. She was getting physical therapy at home and her residual left-sided weakness from her stroke was getting better. On the day of admission she had worsening left-sided weakness in her arms and legs, denied change in her vision, facial droop or slurred speech. She also notices some swellings in her legs bilaterally.  Patient placed on observation for possible TIA  Hospital course  Transient ischemic attack (TIA) Multiple prior stroke and TIAs. CT head and MRI brain negative for acute stroke. Acute weakness resolved. Lipid panel normal. Continued aspirin, Pradaxa and Lipitor. Given her recent complete workup and patient being on appropriate medications no further testing or medication adjustment made. Seen by PT and recommends to resume her home health.  Patient lives  with her grandson and her son and daughter-in-law live close by. On questioning her daughter-in-law gives her medications and family reported that they have not missed any of the dose recently. Follow-up with neurology as scheduled.  Bilateral leg edema Unclear etiology. BNP normal. Recent 2-D echo with normal EF and no diastolic dysfunction. Received her dose of IV Lasix on admission. No leg swelling noted on my exam today. TSH normal. Will not pursue further workup.  Chronic atrial fibrillation Continue Pradaxa. Rate controlled  Hypokalemia Replenish  Remaining medical issues are stable. Patient can be discharged home with outpatient follow-up  Consults: None (discussed with neuro hospitalist on the phone)  Procedures: CT head MRI brain  Disposition: Home  Family communication: Discussed with son on the phone   Discharge Instructions   Allergies as of 04/21/2017      Reactions   Penicillins Hives   Has patient had a PCN reaction causing immediate rash, facial/tongue/throat swelling, SOB or lightheadedness with hypotension: Yes Has patient had a PCN reaction causing severe rash involving mucus membranes or skin necrosis: Unknown Has patient had a PCN reaction that required hospitalization: Unknown Has patient had a PCN reaction occurring within the last 10 years: No If all of the above answers are "NO", then may proceed with Cephalosporin use.      Medication List    TAKE these medications   aspirin 81 MG chewable tablet Chew 81 mg by mouth every evening.   atenolol 25 MG tablet Commonly known as:  TENORMIN Take 1 tablet (25 mg total) by mouth daily.   atorvastatin 20 MG tablet Commonly known as:  LIPITOR Take 1 tablet (20 mg total) by mouth daily at 6 PM.   dabigatran 150  MG Caps capsule Commonly known as:  PRADAXA Take 1 capsule (150 mg total) by mouth every 12 (twelve) hours.   meclizine 25 MG tablet Commonly known as:  ANTIVERT Take 25 mg by mouth daily  as needed for dizziness.   multivitamin tablet Take 1 tablet by mouth daily.      Follow-up Information    Leanna Battles, MD. Schedule an appointment as soon as possible for a visit in 1 week(s).   Specialty:  Internal Medicine Contact information: [place] [address] [phone]          Allergies  Allergen Reactions  . Penicillins Hives    Has patient had a PCN reaction causing immediate rash, facial/tongue/throat swelling, SOB or lightheadedness with hypotension: Yes Has patient had a PCN reaction causing severe rash involving mucus membranes or skin necrosis: Unknown Has patient had a PCN reaction that required hospitalization: Unknown Has patient had a PCN reaction occurring within the last 10 years: No If all of the above answers are "NO", then may proceed with Cephalosporin use.       Procedures/Studies: Dg Chest 2 View  Result Date: 04/20/2017 CLINICAL DATA:  New onset peripheral edema of the left ankle. EXAM: CHEST  2 VIEW COMPARISON:  03/26/2017 FINDINGS: Stable cardiomegaly with aortic atherosclerosis. No aneurysm. No acute pulmonary edema or pneumonic consolidation. Trace bilateral pleural effusions however are noted with blunting of the costophrenic angles. IMPRESSION: Trace bilateral pleural effusions. Aortic atherosclerosis. No overt pulmonary edema. Electronically Signed   By: Ashley Royalty M.D.   On: 04/20/2017 19:51   Dg Chest 2 View  Result Date: 03/26/2017 CLINICAL DATA:  Generalized weakness. EXAM: CHEST  2 VIEW COMPARISON:  10/18/2009 . FINDINGS: Mediastinum hilar structures normal. Cardiomegaly mild pulmonary vascular prominence. Mild subsegmental atelectasis left lung base. No pleural effusion or pneumothorax IMPRESSION: 1. Cardiomegaly with mild pulmonary vascular prominence. 2.  Mild subsegmental atelectasis left lung base . Electronically Signed   By: Marcello Moores  Register   On: 03/26/2017 13:24   Ct Head Wo Contrast  Result Date:  04/20/2017 CLINICAL DATA:  Left lower extremity weakness. EXAM: CT HEAD WITHOUT CONTRAST TECHNIQUE: Contiguous axial images were obtained from the base of the skull through the vertex without intravenous contrast. COMPARISON:  CT scan of March 07, 2017. FINDINGS: Brain: Mild chronic ischemic white matter disease is noted. Stable calcifications are seen in the right thalamus consistent with old lacunar infarction with associated calcifications. No mass effect or midline shift is noted. Ventricular size is within normal limits. There is no evidence of mass lesion, hemorrhage or acute infarction. Vascular: No hyperdense vessel or unexpected calcification. Skull: Normal. Negative for fracture or focal lesion. Sinuses/Orbits: No acute finding. Other: None. IMPRESSION: Mild chronic ischemic white matter disease. No acute intracranial abnormality seen. Electronically Signed   By: Marijo Conception, M.D.   On: 04/20/2017 14:28   Mr Brain Wo Contrast  Result Date: 04/21/2017 CLINICAL DATA:  Progressive left-sided weakness. EXAM: MRI HEAD WITHOUT CONTRAST TECHNIQUE: Multiplanar, multiecho pulse sequences of the brain and surrounding structures were obtained without intravenous contrast. COMPARISON:  CT head 8/10/818.  MRI brain 03/27/2017 and 01/17/2017. FINDINGS: Brain: The subacute posterior limb right internal capsule infarct is again noted. Adjacent calcifications of the posterior limb and right thalamus are stable. No new or progressive infarct is present. Mild periventricular and subcortical white matter disease bilaterally is stable. No acute hemorrhage or mass lesion is present. The internal auditory canals are within normal limits bilaterally. The  brainstem and cerebellum are normal. Vascular: Flow is present in the major intracranial arteries. Skull and upper cervical spine: The skullbase is within normal limits. The craniocervical junction demonstrates degenerative changes posterior to the dens, suggesting  rheumatoid arthritis. The craniocervical junction is otherwise normal. Midline sagittal structures are unremarkable. Degenerative disc disease and central canal narrowing is again noted at C3-4. Sinuses/Orbits: Mild mucosal thickening is present in the anterior ethmoid air cells bilaterally. A fluid level is noted in the sphenoid sinuses bilaterally. Bilateral mastoid effusions are present. Bilateral lens replacements are present. The globes and orbits are within normal limits otherwise. IMPRESSION: 1. No acute intracranial abnormality or significant interval change. 2. Stable subacute posterior limb right internal capsule infarct. 3. Remote calcifications involving the posterior limb right internal capsule and right thalamus. 4. Stable mild atrophy and white matter disease. 5. Minimal fluid in the sphenoid sinuses and mastoid air cells. Electronically Signed   By: San Morelle M.D.   On: 04/21/2017 07:40   Mr Brain Wo Contrast  Result Date: 03/28/2017 CLINICAL DATA:  Stroke.  Mildly worsening left leg weakness. EXAM: MRI HEAD WITHOUT CONTRAST TECHNIQUE: Multiplanar, multiecho pulse sequences of the brain and surrounding structures were obtained without intravenous contrast. COMPARISON:  03/07/2017 FINDINGS: Brain: Fading restricted diffusion in the posterior limb internal capsule on the right. No progression since prior. No new area of infarction. Neighboring remote insult in the upper and posterior right thalamus with mineralization. No acute hemorrhage, hydrocephalus, or masslike findings. Minimal chronic microvascular ischemic change in the cerebral white matter for age. Vascular: Negative Skull and upper cervical spine: Negative for marrow lesion. Marked ligamentous thickening or pannus behind the dens. No cervicomedullary impression. Sinuses/Orbits: Negative IMPRESSION: Known recent infarct in the posterior limb right internal capsule. No new abnormality. Electronically Signed   By: Monte Fantasia  M.D.   On: 03/28/2017 08:40      Subjective: Denies any further weakness on her left side.  Discharge Exam: Vitals:   04/21/17 0942 04/21/17 1402  BP: (!) 142/64 (!) 143/45  Pulse: 82 73  Resp: 16 16  Temp: 98.1 F (36.7 C) 98.6 F (37 C)  SpO2: 97% 96%   Vitals:   04/21/17 0300 04/21/17 0500 04/21/17 0942 04/21/17 1402  BP: (!) 122/50 140/64 (!) 142/64 (!) 143/45  Pulse: 77 67 82 73  Resp: 16 16 16 16   Temp: 97.9 F (36.6 C) 97.8 F (36.6 C) 98.1 F (36.7 C) 98.6 F (37 C)  TempSrc: Oral Oral Oral Oral  SpO2: 97% 97% 97% 96%  Weight:      Height:        General:Elderly female not in distress HEENT: Moist mucosa, supple neck Chest: Clear bilaterally CVS: S1 and S2 irregular, no murmurs GI: Soft, nondistended, nontender Musculoskeletal: Warm, no edema CNS: Alert and oriented, chronic mild left-sided weakness    The results of significant diagnostics from this hospitalization (including imaging, microbiology, ancillary and laboratory) are listed below for reference.     Microbiology: No results found for this or any previous visit (from the past 240 hour(s)).   Labs: BNP (last 3 results)  Recent Labs  04/20/17 1343  BNP 818.2*   Basic Metabolic Panel:  Recent Labs Lab 04/20/17 1343 04/20/17 1353 04/21/17 0408  NA 142 141  --   K 3.3* 3.2*  --   CL 109 106  --   CO2 26  --   --   GLUCOSE 113* 111*  --   BUN 6 4*  --  CREATININE 0.77 0.60  --   CALCIUM 9.1  --   --   MG  --   --  1.6*   Liver Function Tests:  Recent Labs Lab 04/20/17 1343  AST 29  ALT 13*  ALKPHOS 44  BILITOT 1.3*  PROT 6.1*  ALBUMIN 3.6   No results for input(s): LIPASE, AMYLASE in the last 168 hours. No results for input(s): AMMONIA in the last 168 hours. CBC:  Recent Labs Lab 04/20/17 1343 04/20/17 1353  WBC 5.7  --   NEUTROABS 4.3  --   HGB 11.1* 10.9*  HCT 32.3* 32.0*  MCV 89.2  --   PLT 144*  --    Cardiac Enzymes: No results for input(s):  CKTOTAL, CKMB, CKMBINDEX, TROPONINI in the last 168 hours. BNP: Invalid input(s): POCBNP CBG:  Recent Labs Lab 04/20/17 1319  GLUCAP 119*   D-Dimer No results for input(s): DDIMER in the last 72 hours. Hgb A1c No results for input(s): HGBA1C in the last 72 hours. Lipid Profile  Recent Labs  04/21/17 0408  CHOL 123  HDL 57  LDLCALC 53  TRIG 63  CHOLHDL 2.2   Thyroid function studies No results for input(s): TSH, T4TOTAL, T3FREE, THYROIDAB in the last 72 hours.  Invalid input(s): FREET3 Anemia work up No results for input(s): VITAMINB12, FOLATE, FERRITIN, TIBC, IRON, RETICCTPCT in the last 72 hours. Urinalysis    Component Value Date/Time   COLORURINE YELLOW 04/20/2017 0331   APPEARANCEUR CLEAR 04/20/2017 0331   LABSPEC 1.011 04/20/2017 0331   PHURINE 5.0 04/20/2017 0331   GLUCOSEU NEGATIVE 04/20/2017 0331   HGBUR NEGATIVE 04/20/2017 0331   BILIRUBINUR NEGATIVE 04/20/2017 0331   KETONESUR 5 (A) 04/20/2017 0331   PROTEINUR NEGATIVE 04/20/2017 0331   NITRITE NEGATIVE 04/20/2017 0331   LEUKOCYTESUR NEGATIVE 04/20/2017 0331   Sepsis Labs Invalid input(s): PROCALCITONIN,  WBC,  LACTICIDVEN Microbiology No results found for this or any previous visit (from the past 240 hour(s)).   Time coordinating discharge: < 30 minutes  SIGNED:   Louellen Molder, MD  [facility] 04/21/2017, 2:21 PM Pager   If 7PM-7AM, please contact night-coverage [email or web address] Password [email or web address]

## 2017-04-22 LAB — HEMOGLOBIN A1C
Hgb A1c MFr Bld: 5.6 % (ref 4.8–5.6)
MEAN PLASMA GLUCOSE: 114 mg/dL

## 2017-04-25 DIAGNOSIS — Z8673 Personal history of transient ischemic attack (TIA), and cerebral infarction without residual deficits: Secondary | ICD-10-CM | POA: Diagnosis not present

## 2017-04-25 DIAGNOSIS — I4891 Unspecified atrial fibrillation: Secondary | ICD-10-CM | POA: Diagnosis not present

## 2017-04-25 DIAGNOSIS — G459 Transient cerebral ischemic attack, unspecified: Secondary | ICD-10-CM | POA: Diagnosis not present

## 2017-04-25 DIAGNOSIS — Z0189 Encounter for other specified special examinations: Secondary | ICD-10-CM | POA: Diagnosis not present

## 2017-05-03 DIAGNOSIS — J90 Pleural effusion, not elsewhere classified: Secondary | ICD-10-CM | POA: Diagnosis not present

## 2017-05-03 DIAGNOSIS — I480 Paroxysmal atrial fibrillation: Secondary | ICD-10-CM | POA: Diagnosis not present

## 2017-05-03 DIAGNOSIS — Z6822 Body mass index (BMI) 22.0-22.9, adult: Secondary | ICD-10-CM | POA: Diagnosis not present

## 2017-05-03 DIAGNOSIS — R600 Localized edema: Secondary | ICD-10-CM | POA: Diagnosis not present

## 2017-05-03 DIAGNOSIS — I10 Essential (primary) hypertension: Secondary | ICD-10-CM | POA: Diagnosis not present

## 2017-05-08 ENCOUNTER — Encounter: Payer: Medicare Other | Admitting: Physical Medicine & Rehabilitation

## 2017-05-19 NOTE — Progress Notes (Signed)
Personally  participated in, made any corrections needed, and agree with history, physical, neuro exam,assessment and plan as stated.     Madysin Crisp, MD [facility]     

## 2017-07-06 DIAGNOSIS — E038 Other specified hypothyroidism: Secondary | ICD-10-CM | POA: Diagnosis not present

## 2017-07-06 DIAGNOSIS — Z23 Encounter for immunization: Secondary | ICD-10-CM | POA: Diagnosis not present

## 2017-07-06 DIAGNOSIS — R2681 Unsteadiness on feet: Secondary | ICD-10-CM | POA: Diagnosis not present

## 2017-07-06 DIAGNOSIS — I10 Essential (primary) hypertension: Secondary | ICD-10-CM | POA: Diagnosis not present

## 2017-07-06 DIAGNOSIS — Z6823 Body mass index (BMI) 23.0-23.9, adult: Secondary | ICD-10-CM | POA: Diagnosis not present

## 2017-07-06 DIAGNOSIS — E7849 Other hyperlipidemia: Secondary | ICD-10-CM | POA: Diagnosis not present

## 2017-07-06 DIAGNOSIS — L988 Other specified disorders of the skin and subcutaneous tissue: Secondary | ICD-10-CM | POA: Diagnosis not present

## 2017-07-06 DIAGNOSIS — G458 Other transient cerebral ischemic attacks and related syndromes: Secondary | ICD-10-CM | POA: Diagnosis not present

## 2017-07-06 DIAGNOSIS — I480 Paroxysmal atrial fibrillation: Secondary | ICD-10-CM | POA: Diagnosis not present

## 2017-07-10 DIAGNOSIS — Z85828 Personal history of other malignant neoplasm of skin: Secondary | ICD-10-CM | POA: Diagnosis not present

## 2017-07-10 DIAGNOSIS — C44629 Squamous cell carcinoma of skin of left upper limb, including shoulder: Secondary | ICD-10-CM | POA: Diagnosis not present

## 2017-08-01 NOTE — Progress Notes (Deleted)
[facility]  PATIENT: Christy Sutton DOB: May 13, 1926   REASON FOR VISIT: Hospital follow-up for stroke HISTORY FROM: Patient and daughter-in-law    HISTORY OF PRESENT ILLNESS:FROM [place] an 83 y.o.female who woke up the morning of 03/07/2017 at her baseline. She has history of 2 prior strokes in April as well as May 2018 from atrial fibrillation despite being on eliquis. The patient apparently ate breakfast 03/07/2017 morning and was fine but at around 9 AM noticed sudden onset of left-sided weakness and incoordination. She did not call for help immediately and waited to see if symptoms improve. Later on as she did not feel better, and EMS were called and code stroke was activated en route. Upon arrival patient was met by the stroke team and noted to have left arm clumsiness and left leg weakness. CT scan of the head was obtained emergently which showed new area of acute cerebral infarction involving the RIGHT lateral thalamus and posterior limb internal capsule, adjacent to the area of previous ischemia on the Jan 17, 2017 scan. Patient's NIHstroke scale on admission was felt to be 3 by RN but by Dr. Clydene Fake documentation it was 4.  Patient states she has taken her dose of eliquis the morning of 03/07/2017. Review of patient's medical records revealed that she had a right posterior limb internal capsule infarct adjacent to the calcific area in the right parietal white matter during the last admission a month ago. Eliquis was continued. She made good physical recovery and was able to life at home with close supervision by her family. LSN : 9 am 03/07/17 NIHSS 4 Patient was not administered IV t-PA secondary to anticoagulation with eliquis. She was admitted to General Neurology for further evaluation and treatment. MRI of the brain area of acute cerebral infarction involving the right lateral thalamus and posterior limb internal capsule adjacent to the area of previous  ischemia on 01/17/2017 scan. MRA of the head no proximal vascular stenosis or occlusion. CT angiogram head and neck, Large vessel occlusion and diffuse atherosclerosis. 2-D echo with severely reduced LV emptying velocities LDL 63 hemoglobin A1c 6 eliquis was switched to Pradaxa. She was discharged to inpatient rehabilitation Interval history 04/04/17 CM patient returns to the stroke clinic for hospital follow-up. She just was discharged from inpatient rehabilitation 4 days ago. She is currently on aspirin and Pradaxa for secondary stroke prevention and atrial fibrillation. She has not had further stroke or TIA symptoms since discharge. She claims she has improvement in activity tolerance and improve functional skills with rehabilitation. She is to start home health occupational therapy and physical therapy in the next few days. She is ambulating in the home with a walker. She remains on Lipitor without complaints of myalgias. She has poor fluid intake. B/P in the office today 105/60 She returns for reevaluation  REVIEW OF SYSTEMS: Full 14 system review of systems performed and notable only for those listed, all others are neg:  Constitutional: neg  Cardiovascular: neg Ear/Nose/Throat: neg  Skin: neg Eyes: neg Respiratory: neg Gastroitestinal: neg  Hematology/Lymphatic: neg  Endocrine: neg Musculoskeletal:neg Allergy/Immunology: neg Neurological: Left-sided weakness Psychiatric: neg Sleep : neg   ALLERGIES: Allergies  Allergen Reactions  . Penicillins Hives    Has patient had a PCN reaction causing immediate rash, facial/tongue/throat swelling, SOB or lightheadedness with hypotension: Yes Has patient had a PCN reaction causing severe rash involving mucus membranes or skin necrosis: Unknown Has patient had a PCN reaction that required hospitalization: Unknown Has patient had  a PCN reaction occurring within the last 10 years: No If all of the above answers are "NO", then may proceed with  Cephalosporin use.     HOME MEDICATIONS: Outpatient Medications Prior to Visit  Medication Sig Dispense Refill  . aspirin 81 MG chewable tablet Chew 81 mg by mouth every evening.     Marland Kitchen atenolol (TENORMIN) 25 MG tablet Take 1 tablet (25 mg total) by mouth daily. 30 tablet 0  . atorvastatin (LIPITOR) 20 MG tablet Take 1 tablet (20 mg total) by mouth daily at 6 PM. 30 tablet 0  . dabigatran (PRADAXA) 150 MG CAPS capsule Take 1 capsule (150 mg total) by mouth every 12 (twelve) hours. 60 capsule 0  . meclizine (ANTIVERT) 25 MG tablet Take 25 mg by mouth daily as needed for dizziness.    . Multiple Vitamin (MULTIVITAMIN) tablet Take 1 tablet by mouth daily.     No facility-administered medications prior to visit.     PAST MEDICAL HISTORY: Past Medical History:  Diagnosis Date  . Atrial fibrillation ([place])   . Diabetes mellitus without complication ([place])   . Hyperlipidemia   . Hypertension   . Stroke ([place])    x 3  . TIA (transient ischemic attack)     PAST SURGICAL HISTORY: Past Surgical History:  Procedure Laterality Date  . ABDOMINAL HYSTERECTOMY     due to tubal pregnancies  . TEE WITHOUT CARDIOVERSION N/A 01/30/2017   Procedure: TRANSESOPHAGEAL ECHOCARDIOGRAM (TEE);  Surgeon: Adrian Prows, MD;  Location: [place];  Service: Cardiovascular;  Laterality: N/A;  . TUBAL LIGATION      FAMILY HISTORY: Family History  Problem Relation Age of Onset  . Heart attack Father   . Stroke Father   . Stroke Mother   . Stroke Sister   . Stroke Sister     SOCIAL HISTORY: Social History   Socioeconomic History  . Marital status: Widowed    Spouse name: Not on file  . Number of children: 2  . Years of education: 20  . Highest education level: Not on file  Social Needs  . Financial resource strain: Not on file  . Food insecurity - worry: Not on file  . Food insecurity - inability: Not on file  . Transportation needs - medical: Not on file  . Transportation needs - non-medical:  Not on file  Occupational History    Comment: NA  Tobacco Use  . Smoking status: Never Smoker  . Smokeless tobacco: Never Used  Substance and Sexual Activity  . Alcohol use: No  . Drug use: No  . Sexual activity: Not on file  Other Topics Concern  . Not on file  Social History Narrative   Lives with grandson   Caffeine- 12 oz Pepsi, coffee 1/2 cup     PHYSICAL EXAM  There were no vitals filed for this visit. There is no height or weight on file to calculate BMI.  Generalized: Well developed, in no acute distress  Head: normocephalic and atraumatic,. Oropharynx Dry mucous membranes Neck: Supple, no carotid bruits  Cardiac: Regular rate rhythm, no murmur  Musculoskeletal: No deformity  Skin turgor is poor  Neurological examination   Mentation: Alert oriented to time, place, history taking. Attention span and concentration appropriate. Recent and remote memory intact.  Follows all commands speech and language fluent.   Cranial nerve II-XII: Pupils were equal round reactive to light extraocular movements were full, visual field were full on confrontational test. Facial sensation and strength were normal.  hearing was intact to finger rubbing bilaterally. Uvula tongue midline. head turning and shoulder shrug were normal and symmetric.Tongue protrusion into cheek strength was normal. Motor: normal bulk and tone, full strength in the BUE, BLE,on the right 4 out of 5 left upper and lower extremity Sensory: normal and symmetric to light touch, pinprick, and  Vibration, proprioception  Coordination: finger-nose-finger, normal on the right difficulty on the left with impaired coordination Reflexes: Symmetric upper and lower, plantar responses were flexor bilaterally. Gait and Station: In wheelchair gait not tested due to safety concerns  DIAGNOSTIC DATA (LABS, IMAGING, TESTING) - I reviewed patient records, labs, notes, testing and imaging myself where available.  Lab Results    Component Value Date   WBC 5.7 04/20/2017   HGB 10.9 (L) 04/20/2017   HCT 32.0 (L) 04/20/2017   MCV 89.2 04/20/2017   PLT 144 (L) 04/20/2017      Component Value Date/Time   NA 141 04/20/2017 1353   K 3.2 (L) 04/20/2017 1353   CL 106 04/20/2017 1353   CO2 26 04/20/2017 1343   GLUCOSE 111 (H) 04/20/2017 1353   BUN 4 (L) 04/20/2017 1353   CREATININE 0.60 04/20/2017 1353   CALCIUM 9.1 04/20/2017 1343   PROT 6.1 (L) 04/20/2017 1343   ALBUMIN 3.6 04/20/2017 1343   AST 29 04/20/2017 1343   ALT 13 (L) 04/20/2017 1343   ALKPHOS 44 04/20/2017 1343   BILITOT 1.3 (H) 04/20/2017 1343   GFRNONAA >60 04/20/2017 1343   GFRAA >60 04/20/2017 1343   Lab Results  Component Value Date   CHOL 123 04/21/2017   HDL 57 04/21/2017   LDLCALC 53 04/21/2017   TRIG 63 04/21/2017   CHOLHDL 2.2 04/21/2017   Lab Results  Component Value Date   HGBA1C 5.6 04/21/2017    ASSESSMENT AND PLAN  83 y.o. year old female  has a past medical history of Atrial fibrillation ([place]), Diabetes mellitus without complication ([place]), Hyperlipidemia, Hypertension, Stroke ([place]), and TIA (transient ischemic attack). here for hospital follow-up for stroke.The patient is a current patient of Dr. Leonie Man  who is out of the office today . This note is sent to the work in doctor.     PLAN: Stressed the importance of management of risk factors to prevent further stroke Continue Pradaxa and aspirin for secondary stroke prevention and atrial fibrillation Maintain strict control of hypertension with blood pressure goal below 130/90, today's reading 105/60 continue antihypertensive medications Control of diabetes with hemoglobin A1c below 6.5 followed by primary care most recent hemoglobin A1c6 Cholesterol with LDL cholesterol less than 70, followed by primary care,  most recent 63 continue statin drugs Lipitor Continue physical therapy occupational therapy at home, perform home exercise program  eat healthy diet with whole grains,   fresh fruits and vegetables Given information on dehydration need to increase intake Follow-up in 4 months Discussed risk for recurrent stroke/ TIA and answered additional questions This was a prolonged visit requiring 30 minutes and medical decision making of high complexity with extensive review of history, hospital chart, counseling and answering questions Dennie Bible, [facility], [facility], APRN  [facility] [address], [place],  [address] [phone]

## 2017-08-06 ENCOUNTER — Telehealth: Payer: Self-pay | Admitting: *Deleted

## 2017-08-06 ENCOUNTER — Ambulatory Visit: Payer: Medicare Other | Admitting: Nurse Practitioner

## 2017-08-06 NOTE — Telephone Encounter (Signed)
Patient was no show for FU today with NP.

## 2017-08-07 ENCOUNTER — Encounter: Payer: Self-pay | Admitting: Nurse Practitioner

## 2017-08-27 DIAGNOSIS — E1151 Type 2 diabetes mellitus with diabetic peripheral angiopathy without gangrene: Secondary | ICD-10-CM | POA: Diagnosis not present

## 2017-08-27 DIAGNOSIS — R2681 Unsteadiness on feet: Secondary | ICD-10-CM | POA: Diagnosis not present

## 2017-08-27 DIAGNOSIS — G458 Other transient cerebral ischemic attacks and related syndromes: Secondary | ICD-10-CM | POA: Diagnosis not present

## 2017-08-27 DIAGNOSIS — H9193 Unspecified hearing loss, bilateral: Secondary | ICD-10-CM | POA: Diagnosis not present

## 2017-08-27 DIAGNOSIS — I480 Paroxysmal atrial fibrillation: Secondary | ICD-10-CM | POA: Diagnosis not present

## 2017-08-27 DIAGNOSIS — G8190 Hemiplegia, unspecified affecting unspecified side: Secondary | ICD-10-CM | POA: Diagnosis not present

## 2017-08-27 DIAGNOSIS — I10 Essential (primary) hypertension: Secondary | ICD-10-CM | POA: Diagnosis not present

## 2017-08-27 DIAGNOSIS — Z6823 Body mass index (BMI) 23.0-23.9, adult: Secondary | ICD-10-CM | POA: Diagnosis not present

## 2017-08-27 DIAGNOSIS — E7849 Other hyperlipidemia: Secondary | ICD-10-CM | POA: Diagnosis not present

## 2017-12-25 DIAGNOSIS — Z85828 Personal history of other malignant neoplasm of skin: Secondary | ICD-10-CM | POA: Diagnosis not present

## 2017-12-25 DIAGNOSIS — C44622 Squamous cell carcinoma of skin of right upper limb, including shoulder: Secondary | ICD-10-CM | POA: Diagnosis not present

## 2017-12-25 DIAGNOSIS — D0462 Carcinoma in situ of skin of left upper limb, including shoulder: Secondary | ICD-10-CM | POA: Diagnosis not present

## 2017-12-25 DIAGNOSIS — L570 Actinic keratosis: Secondary | ICD-10-CM | POA: Diagnosis not present

## 2018-04-25 IMAGING — MR MR HEAD W/O CM
8 of 10 series · 35 of 48 positions shown · non-contrast
Comparison: MRI of the brain 01/05/2017. CTA of the head and neck
from the same day.

CLINICAL DATA: TIA versus stroke. Episodes of left-sided weakness
since discharge from hospital 01/06/2017.

EXAM:
MRI HEAD WITHOUT CONTRAST
TECHNIQUE: Multiplanar, multiecho pulse sequences of the brain and surrounding
structures were obtained without intravenous contrast.

[Series 3: DWI · axial · 3.0mm · 1.09mm/px · z∈[-75,+63]mm · 9 of 94 slices shown (1 of 4)]
[im 1/94]
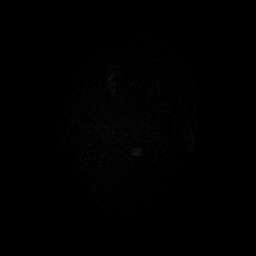
[im 12/94]
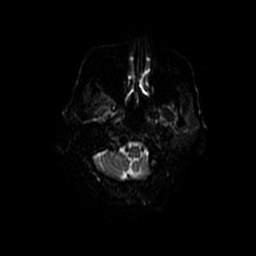
[im 24/94]
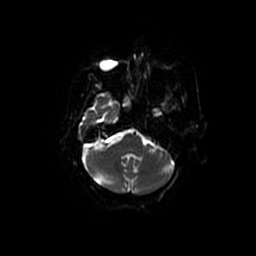
[im 35/94]
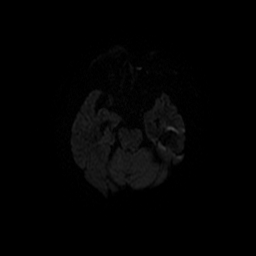
[im 47/94]
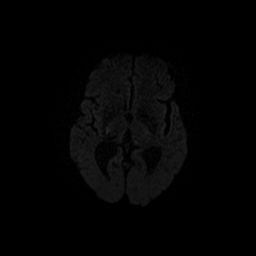
[im 59/94]
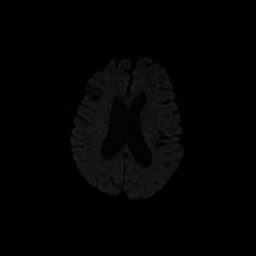
[im 70/94]
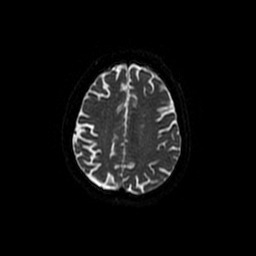
[im 82/94]
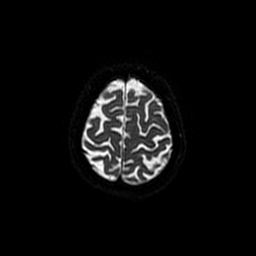
[im 94/94]
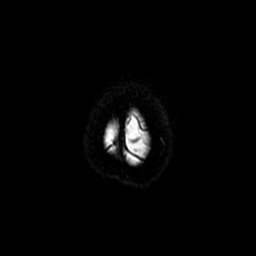

[Series 4: DWI · coronal · 5.0mm · 1.09mm/px · 7 of 66 slices shown (2 of 4)]
[im 1/66]
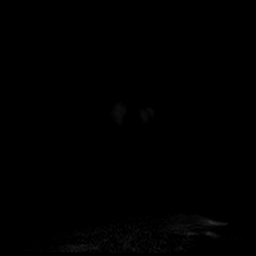
[im 11/66]
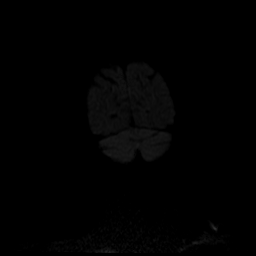
[im 22/66]
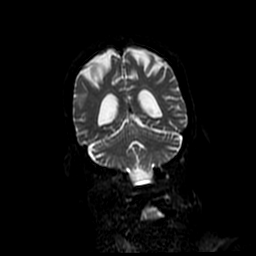
[im 33/66]
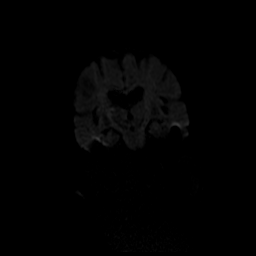
[im 44/66]
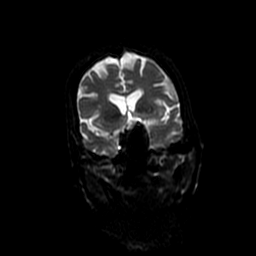
[im 55/66]
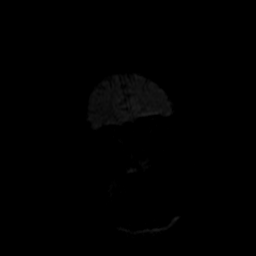
[im 66/66]
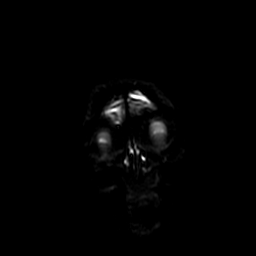

[Series 6: T2 · axial · 5.0mm · 0.43mm/px · z∈[-75,+62]mm · 3 of 24 slices shown (1 of 2)]
[im 1/24]
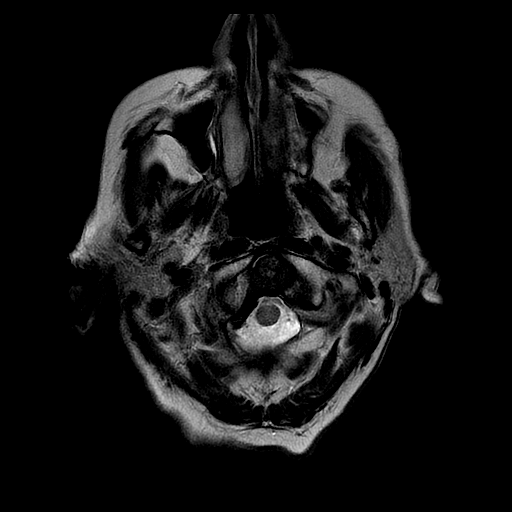
[im 12/24]
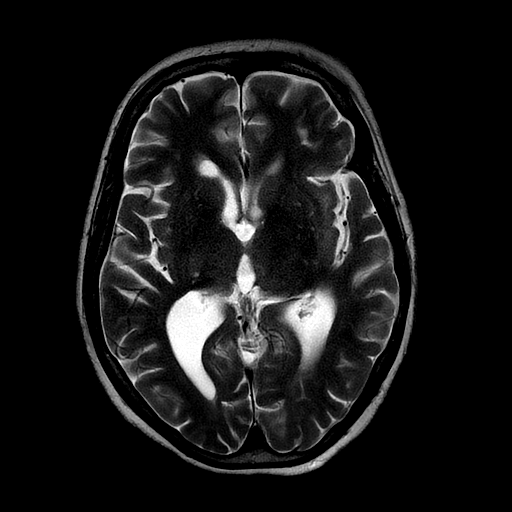
[im 24/24]
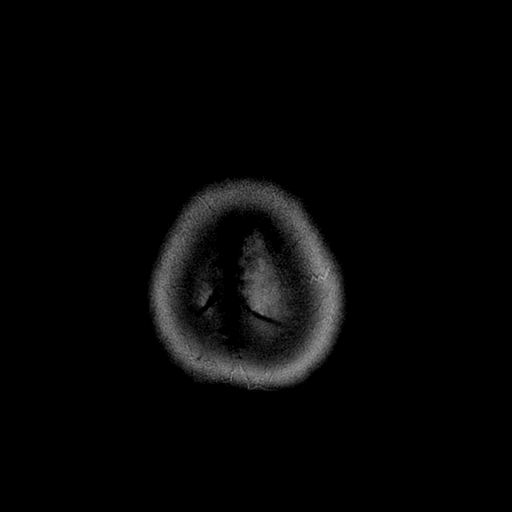

[Series 7: FLAIR · axial · 5.0mm · 0.43mm/px · z∈[-75,+62]mm · 3 of 24 slices shown]
[im 1/24]
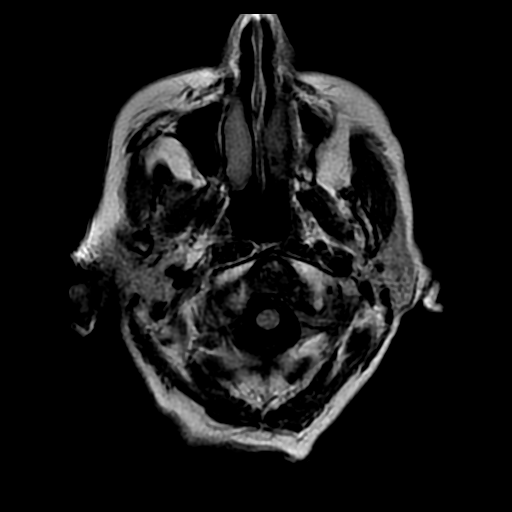
[im 12/24]
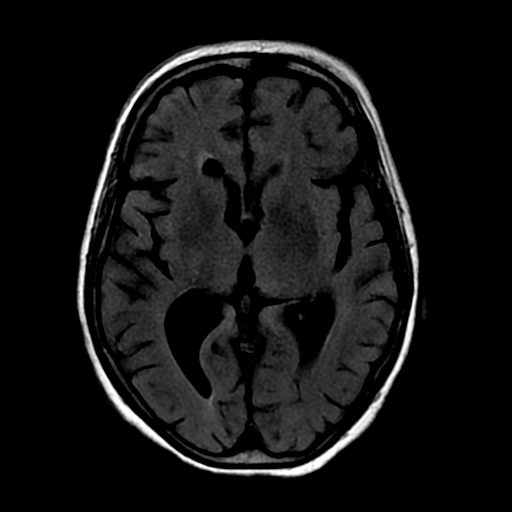
[im 24/24]
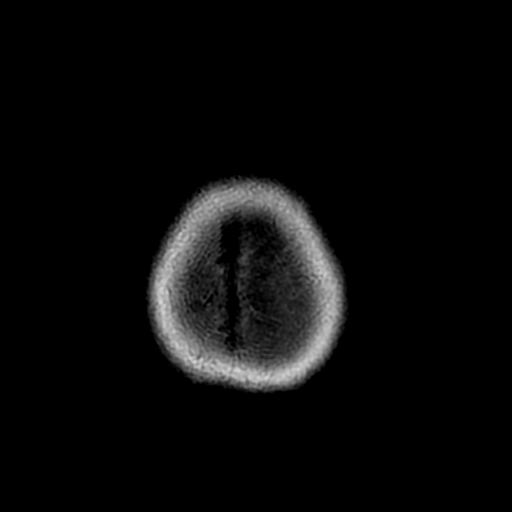

[Series 8: T1 · sagittal · 5.0mm · 0.47mm/px · 2 of 23 slices shown]
[im 1/23]
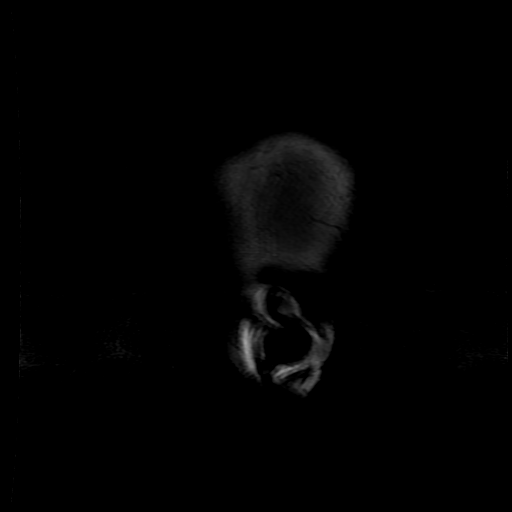
[im 23/23]
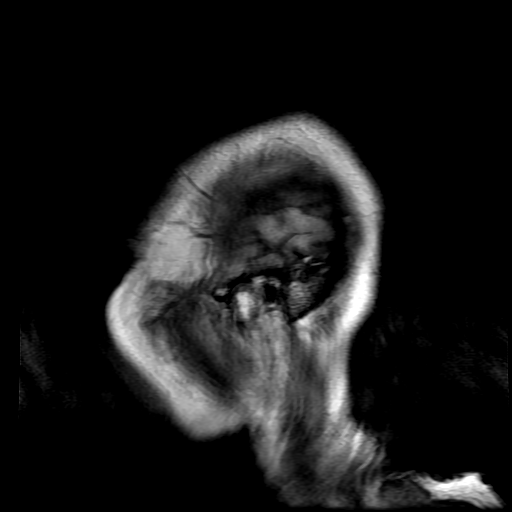

[Series 11: T2 · coronal · 5.0mm · 0.43mm/px · 3 of 28 slices shown (2 of 2)]
[im 1/28]
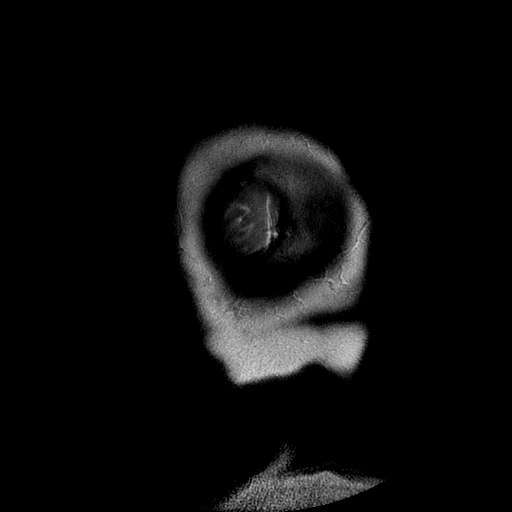
[im 14/28]
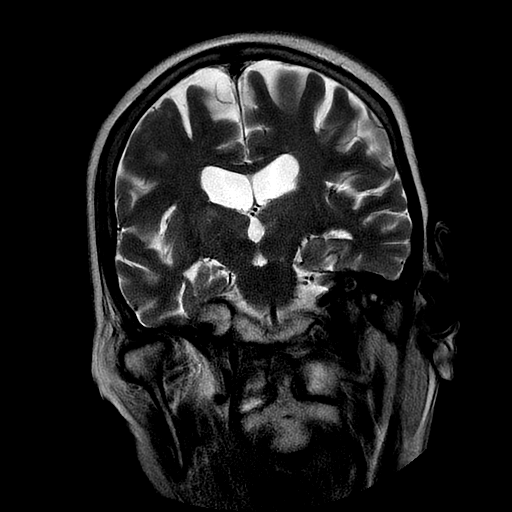
[im 28/28]
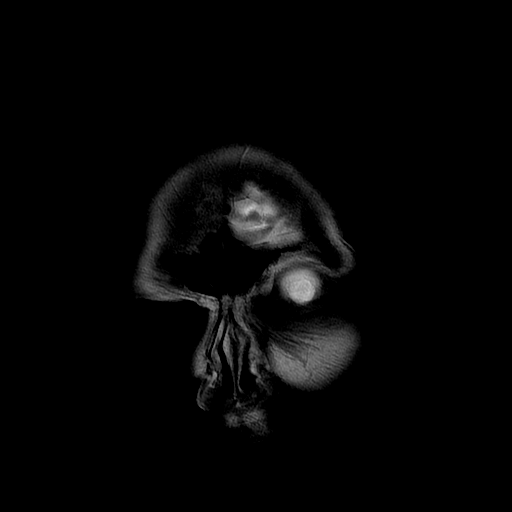

[Series 300: DWI · axial · 3.0mm · 1.09mm/px · z∈[-75,+63]mm · 5 of 47 slices shown (3 of 4)]
[im 1/47]
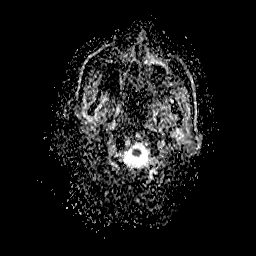
[im 12/47]
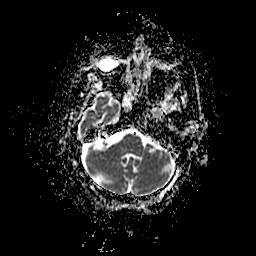
[im 24/47]
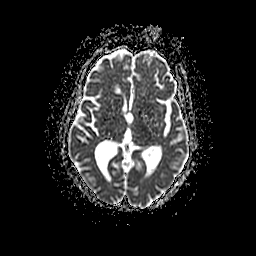
[im 35/47]
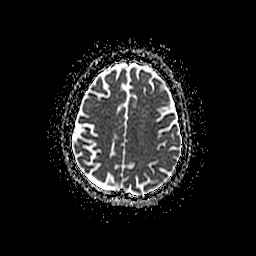
[im 47/47]
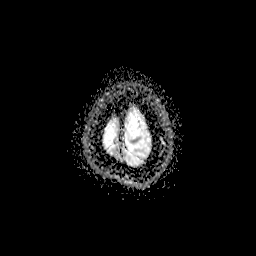

[Series 400: DWI · coronal · 5.0mm · 1.09mm/px · 3 of 33 slices shown (4 of 4)]
[im 1/33]
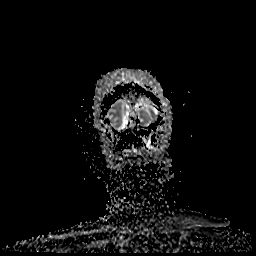
[im 17/33]
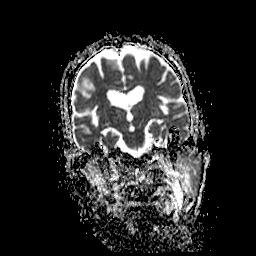
[im 33/33]
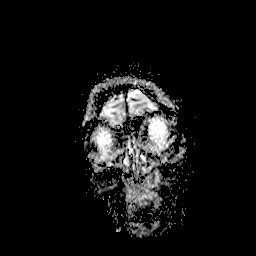

[35 of 48 positions shown; findings below may reference images not displayed]

FINDINGS: Brain: A focal area of restricted diffusion is present along the
posterior limb of the right internal capsule, and just lateral to
the area of calcification. No other acute or focal infarct is
present. Associated T2 changes are present. The calcifications are
noted without evidence for hemorrhage. The ventricles are
proportionate to the degree of atrophy. Minimal white matter disease
is present otherwise. The internal auditory canals are within normal
limits. The brainstem and cerebellum are normal.

Vascular: Flow is present in the major intracranial arteries.

Skull and upper cervical spine: The skullbase is within normal
limits. The craniocervical junction demonstrates soft tissue
posterior to the dens likely associated with rheumatoid disease. A
focal disc protrusion effaces the ventral CSF at C3-4. Marrow signal
is within normal limits.

Sinuses/Orbits: Chronic opacification of the right sphenoid sinus is
again noted. The paranasal sinuses are otherwise clear. There is
some fluid in the mastoid air cells bilaterally. No obstructing
nasopharyngeal lesion is present.
IMPRESSION: 1. Acute nonhemorrhagic 7 mm white matter infarcts involving the
posterior limb of the right internal capsule, just lateral to the
area of chronic calcification.
2. Otherwise stable mild generalized atrophy without other
significant white matter disease.
3. Chronic opacification of the right sphenoid sinus.
4. Bilateral mastoid effusions without obstructing nasopharyngeal
lesion.
5. Degenerative changes within the upper cervical spine.

## 2018-07-27 IMAGING — CR DG CHEST 2V
2 series · 2 of 2 positions shown · non-contrast
Comparison: 03/26/2017

CLINICAL DATA: New onset peripheral edema of the left ankle.

EXAM:
CHEST  2 VIEW

[chest lat]
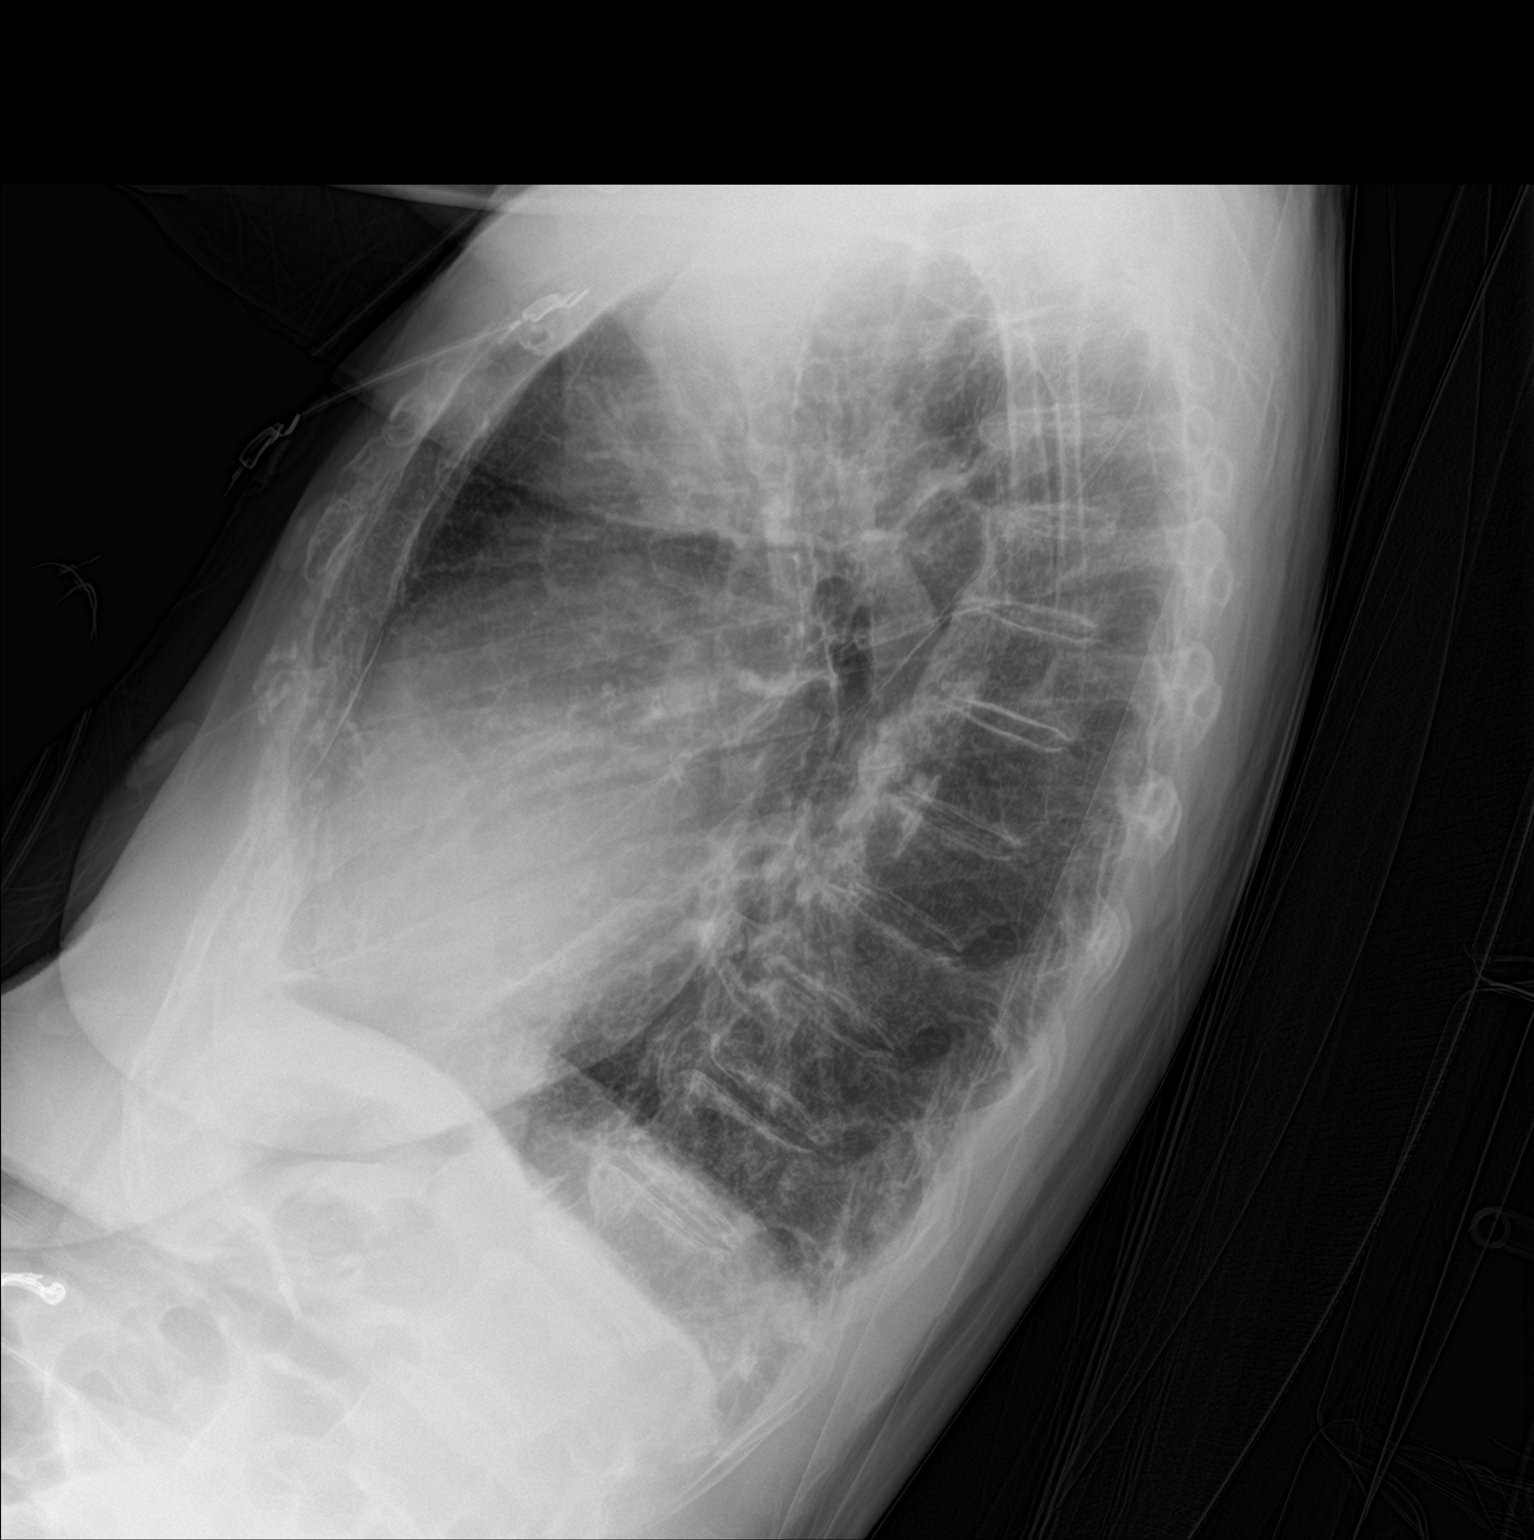

[chest ap]
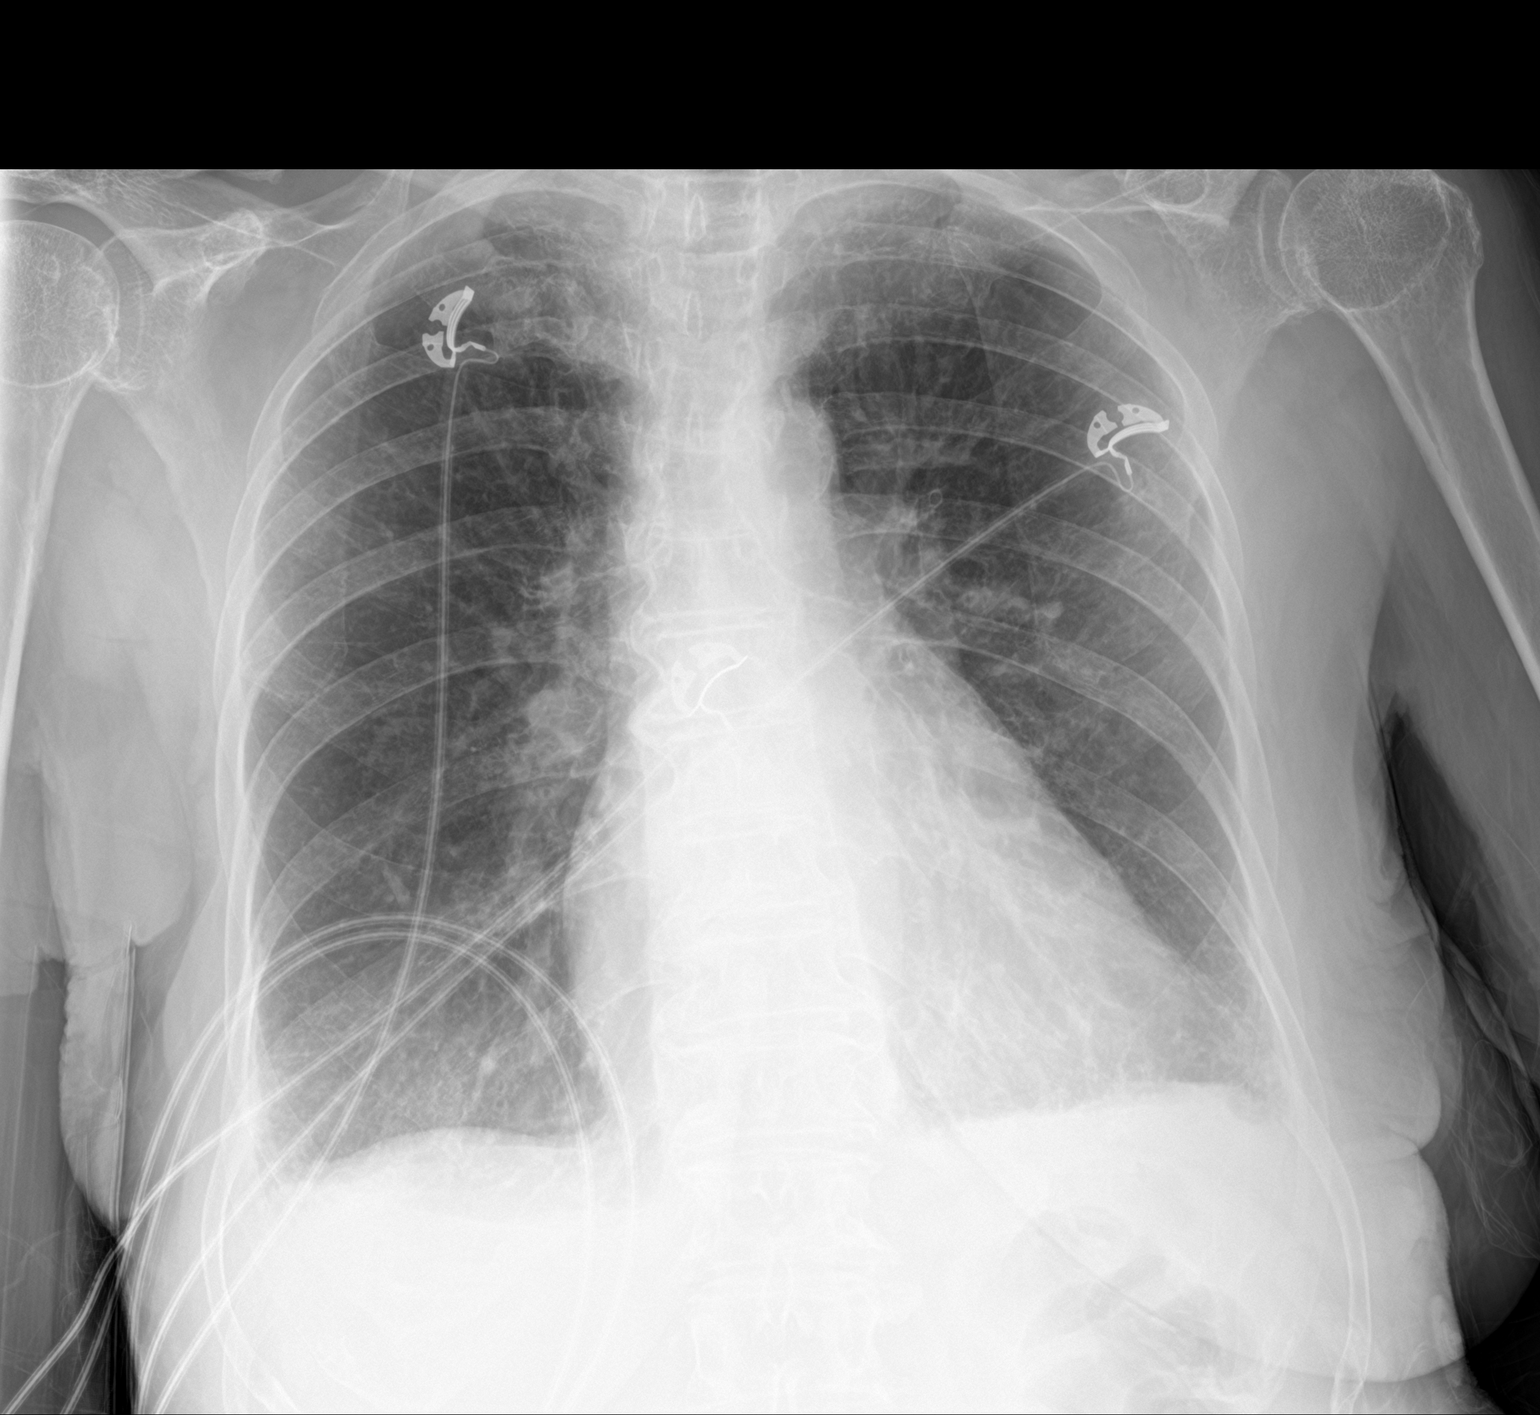

[2 of 2 positions shown; findings below may reference images not displayed]

FINDINGS: Stable cardiomegaly with aortic atherosclerosis. No aneurysm. No
acute pulmonary edema or pneumonic consolidation. Trace bilateral
pleural effusions however are noted with blunting of the
costophrenic angles.
IMPRESSION: Trace bilateral pleural effusions. Aortic atherosclerosis. No overt
pulmonary edema.

## 2018-11-25 DIAGNOSIS — G8190 Hemiplegia, unspecified affecting unspecified side: Secondary | ICD-10-CM | POA: Diagnosis not present

## 2018-11-25 DIAGNOSIS — I480 Paroxysmal atrial fibrillation: Secondary | ICD-10-CM | POA: Diagnosis not present

## 2018-11-25 DIAGNOSIS — E038 Other specified hypothyroidism: Secondary | ICD-10-CM | POA: Diagnosis not present

## 2018-11-25 DIAGNOSIS — E7849 Other hyperlipidemia: Secondary | ICD-10-CM | POA: Diagnosis not present

## 2018-11-25 DIAGNOSIS — L988 Other specified disorders of the skin and subcutaneous tissue: Secondary | ICD-10-CM | POA: Diagnosis not present

## 2018-11-25 DIAGNOSIS — Z6826 Body mass index (BMI) 26.0-26.9, adult: Secondary | ICD-10-CM | POA: Diagnosis not present

## 2018-11-25 DIAGNOSIS — I10 Essential (primary) hypertension: Secondary | ICD-10-CM | POA: Diagnosis not present

## 2018-11-25 DIAGNOSIS — R7301 Impaired fasting glucose: Secondary | ICD-10-CM | POA: Diagnosis not present

## 2018-11-25 DIAGNOSIS — G458 Other transient cerebral ischemic attacks and related syndromes: Secondary | ICD-10-CM | POA: Diagnosis not present

## 2019-02-21 DIAGNOSIS — Y998 Other external cause status: Secondary | ICD-10-CM | POA: Diagnosis not present

## 2019-02-21 DIAGNOSIS — S199XXA Unspecified injury of neck, initial encounter: Secondary | ICD-10-CM | POA: Diagnosis not present

## 2019-02-21 DIAGNOSIS — G9389 Other specified disorders of brain: Secondary | ICD-10-CM | POA: Diagnosis not present

## 2019-02-21 DIAGNOSIS — S0083XA Contusion of other part of head, initial encounter: Secondary | ICD-10-CM | POA: Diagnosis not present

## 2019-02-21 DIAGNOSIS — S0012XA Contusion of left eyelid and periocular area, initial encounter: Secondary | ICD-10-CM | POA: Diagnosis not present

## 2019-02-21 DIAGNOSIS — W01198A Fall on same level from slipping, tripping and stumbling with subsequent striking against other object, initial encounter: Secondary | ICD-10-CM | POA: Diagnosis not present

## 2019-02-21 DIAGNOSIS — S0003XA Contusion of scalp, initial encounter: Secondary | ICD-10-CM | POA: Diagnosis not present

## 2019-02-21 DIAGNOSIS — Y92000 Kitchen of unspecified non-institutional (private) residence as  the place of occurrence of the external cause: Secondary | ICD-10-CM | POA: Diagnosis not present

## 2019-12-05 DIAGNOSIS — M25561 Pain in right knee: Secondary | ICD-10-CM | POA: Diagnosis not present

## 2019-12-05 DIAGNOSIS — I480 Paroxysmal atrial fibrillation: Secondary | ICD-10-CM | POA: Diagnosis not present

## 2019-12-05 DIAGNOSIS — M25562 Pain in left knee: Secondary | ICD-10-CM | POA: Diagnosis not present

## 2019-12-05 DIAGNOSIS — I10 Essential (primary) hypertension: Secondary | ICD-10-CM | POA: Diagnosis not present

## 2020-01-14 DIAGNOSIS — R300 Dysuria: Secondary | ICD-10-CM | POA: Diagnosis not present

## 2020-01-14 DIAGNOSIS — N390 Urinary tract infection, site not specified: Secondary | ICD-10-CM | POA: Diagnosis not present

## 2020-01-16 DIAGNOSIS — N390 Urinary tract infection, site not specified: Secondary | ICD-10-CM | POA: Diagnosis not present

## 2020-01-16 DIAGNOSIS — R300 Dysuria: Secondary | ICD-10-CM | POA: Diagnosis not present

## 2020-12-02 DIAGNOSIS — E785 Hyperlipidemia, unspecified: Secondary | ICD-10-CM | POA: Diagnosis not present

## 2020-12-02 DIAGNOSIS — M25562 Pain in left knee: Secondary | ICD-10-CM | POA: Diagnosis not present

## 2020-12-02 DIAGNOSIS — G8190 Hemiplegia, unspecified affecting unspecified side: Secondary | ICD-10-CM | POA: Diagnosis not present

## 2020-12-02 DIAGNOSIS — R7301 Impaired fasting glucose: Secondary | ICD-10-CM | POA: Diagnosis not present

## 2020-12-02 DIAGNOSIS — I480 Paroxysmal atrial fibrillation: Secondary | ICD-10-CM | POA: Diagnosis not present

## 2020-12-02 DIAGNOSIS — E039 Hypothyroidism, unspecified: Secondary | ICD-10-CM | POA: Diagnosis not present

## 2020-12-02 DIAGNOSIS — I10 Essential (primary) hypertension: Secondary | ICD-10-CM | POA: Diagnosis not present

## 2020-12-02 DIAGNOSIS — M25561 Pain in right knee: Secondary | ICD-10-CM | POA: Diagnosis not present

## 2021-01-11 DIAGNOSIS — R2681 Unsteadiness on feet: Secondary | ICD-10-CM | POA: Diagnosis not present

## 2021-01-11 DIAGNOSIS — I10 Essential (primary) hypertension: Secondary | ICD-10-CM | POA: Diagnosis not present

## 2021-01-11 DIAGNOSIS — R600 Localized edema: Secondary | ICD-10-CM | POA: Diagnosis not present

## 2021-01-11 DIAGNOSIS — I480 Paroxysmal atrial fibrillation: Secondary | ICD-10-CM | POA: Diagnosis not present

## 2021-03-12 DIAGNOSIS — Z20822 Contact with and (suspected) exposure to covid-19: Secondary | ICD-10-CM | POA: Diagnosis not present

## 2021-03-14 ENCOUNTER — Emergency Department (HOSPITAL_COMMUNITY): Payer: Medicare Other

## 2021-03-14 ENCOUNTER — Emergency Department (HOSPITAL_COMMUNITY)
Admission: EM | Admit: 2021-03-14 | Discharge: 2021-03-14 | Disposition: A | Discharge status: Home / Self Care | Payer: Medicare Other | Attending: Emergency Medicine | Admitting: Emergency Medicine

## 2021-03-14 DIAGNOSIS — W19XXXA Unspecified fall, initial encounter: Secondary | ICD-10-CM

## 2021-03-14 DIAGNOSIS — R531 Weakness: Secondary | ICD-10-CM | POA: Diagnosis not present

## 2021-03-14 DIAGNOSIS — R009 Unspecified abnormalities of heart beat: Secondary | ICD-10-CM | POA: Diagnosis not present

## 2021-03-14 DIAGNOSIS — S199XXA Unspecified injury of neck, initial encounter: Secondary | ICD-10-CM | POA: Diagnosis not present

## 2021-03-14 DIAGNOSIS — U071 COVID-19: Secondary | ICD-10-CM | POA: Diagnosis not present

## 2021-03-14 DIAGNOSIS — Z8673 Personal history of transient ischemic attack (TIA), and cerebral infarction without residual deficits: Secondary | ICD-10-CM | POA: Diagnosis not present

## 2021-03-14 DIAGNOSIS — Z7901 Long term (current) use of anticoagulants: Secondary | ICD-10-CM | POA: Insufficient documentation

## 2021-03-14 DIAGNOSIS — E785 Hyperlipidemia, unspecified: Secondary | ICD-10-CM | POA: Insufficient documentation

## 2021-03-14 DIAGNOSIS — E1169 Type 2 diabetes mellitus with other specified complication: Secondary | ICD-10-CM | POA: Insufficient documentation

## 2021-03-14 DIAGNOSIS — Z86711 Personal history of pulmonary embolism: Secondary | ICD-10-CM | POA: Insufficient documentation

## 2021-03-14 DIAGNOSIS — Z23 Encounter for immunization: Secondary | ICD-10-CM | POA: Diagnosis not present

## 2021-03-14 DIAGNOSIS — Z7982 Long term (current) use of aspirin: Secondary | ICD-10-CM | POA: Diagnosis not present

## 2021-03-14 DIAGNOSIS — Z7902 Long term (current) use of antithrombotics/antiplatelets: Secondary | ICD-10-CM | POA: Diagnosis not present

## 2021-03-14 DIAGNOSIS — I10 Essential (primary) hypertension: Secondary | ICD-10-CM | POA: Insufficient documentation

## 2021-03-14 DIAGNOSIS — Z79899 Other long term (current) drug therapy: Secondary | ICD-10-CM | POA: Diagnosis not present

## 2021-03-14 DIAGNOSIS — M25522 Pain in left elbow: Secondary | ICD-10-CM | POA: Diagnosis not present

## 2021-03-14 DIAGNOSIS — S0990XA Unspecified injury of head, initial encounter: Secondary | ICD-10-CM | POA: Diagnosis not present

## 2021-03-14 DIAGNOSIS — S51011A Laceration without foreign body of right elbow, initial encounter: Secondary | ICD-10-CM | POA: Insufficient documentation

## 2021-03-14 DIAGNOSIS — Z743 Need for continuous supervision: Secondary | ICD-10-CM | POA: Diagnosis not present

## 2021-03-14 DIAGNOSIS — R29810 Facial weakness: Secondary | ICD-10-CM | POA: Diagnosis not present

## 2021-03-14 DIAGNOSIS — E1129 Type 2 diabetes mellitus with other diabetic kidney complication: Secondary | ICD-10-CM | POA: Insufficient documentation

## 2021-03-14 DIAGNOSIS — A419 Sepsis, unspecified organism: Secondary | ICD-10-CM | POA: Diagnosis not present

## 2021-03-14 DIAGNOSIS — J3489 Other specified disorders of nose and nasal sinuses: Secondary | ICD-10-CM | POA: Diagnosis not present

## 2021-03-14 DIAGNOSIS — W07XXXA Fall from chair, initial encounter: Secondary | ICD-10-CM | POA: Diagnosis not present

## 2021-03-14 DIAGNOSIS — Z8679 Personal history of other diseases of the circulatory system: Secondary | ICD-10-CM | POA: Insufficient documentation

## 2021-03-14 DIAGNOSIS — T07XXXA Unspecified multiple injuries, initial encounter: Secondary | ICD-10-CM

## 2021-03-14 DIAGNOSIS — S0083XA Contusion of other part of head, initial encounter: Secondary | ICD-10-CM | POA: Diagnosis not present

## 2021-03-14 DIAGNOSIS — S59901A Unspecified injury of right elbow, initial encounter: Secondary | ICD-10-CM | POA: Diagnosis present

## 2021-03-14 DIAGNOSIS — R600 Localized edema: Secondary | ICD-10-CM | POA: Diagnosis not present

## 2021-03-14 DIAGNOSIS — I672 Cerebral atherosclerosis: Secondary | ICD-10-CM | POA: Diagnosis not present

## 2021-03-14 LAB — COMPREHENSIVE METABOLIC PANEL
ALT: 16 U/L (ref 0–44)
AST: 42 U/L — ABNORMAL HIGH (ref 15–41)
Albumin: 3.2 g/dL — ABNORMAL LOW (ref 3.5–5.0)
Alkaline Phosphatase: 46 U/L (ref 38–126)
Anion gap: 9 (ref 5–15)
BUN: 8 mg/dL (ref 8–23)
CO2: 25 mmol/L (ref 22–32)
Calcium: 8.3 mg/dL — ABNORMAL LOW (ref 8.9–10.3)
Chloride: 99 mmol/L (ref 98–111)
Creatinine, Ser: 0.88 mg/dL (ref 0.44–1.00)
GFR, Estimated: >60 mL/min (ref >60)
Glucose, Bld: 141 mg/dL — ABNORMAL HIGH (ref 70–99)
Potassium: 3.5 mmol/L (ref 3.5–5.1)
Sodium: 133 mmol/L — ABNORMAL LOW (ref 135–145)
Total Bilirubin: 1 mg/dL (ref 0.3–1.2)
Total Protein: 5.8 g/dL — ABNORMAL LOW (ref 6.5–8.1)

## 2021-03-14 LAB — URINALYSIS, ROUTINE W REFLEX MICROSCOPIC
Bilirubin Urine: NEGATIVE
Glucose, UA: NEGATIVE mg/dL
Hgb urine dipstick: NEGATIVE
Ketones, ur: 5 mg/dL — AB
Leukocytes,Ua: NEGATIVE
Nitrite: NEGATIVE
Protein, ur: NEGATIVE mg/dL
Specific Gravity, Urine: 1.008 (ref 1.005–1.030)
pH: 5 (ref 5.0–8.0)

## 2021-03-14 LAB — CBC WITH DIFFERENTIAL/PLATELET
Abs Immature Granulocytes: 0.01 10*3/uL (ref 0.00–0.07)
Basophils Absolute: 0 10*3/uL (ref 0.0–0.1)
Basophils Relative: 0 %
Eosinophils Absolute: 0 10*3/uL (ref 0.0–0.5)
Eosinophils Relative: 0 %
HCT: 36 % (ref 36.0–46.0)
Hemoglobin: 12.1 g/dL (ref 12.0–15.0)
Immature Granulocytes: 0 %
Lymphocytes Relative: 5 %
Lymphs Abs: 0.2 10*3/uL — ABNORMAL LOW (ref 0.7–4.0)
MCH: 30.9 pg (ref 26.0–34.0)
MCHC: 33.6 g/dL (ref 30.0–36.0)
MCV: 92.1 fL (ref 80.0–100.0)
Monocytes Absolute: 0.5 10*3/uL (ref 0.1–1.0)
Monocytes Relative: 12 %
Neutro Abs: 3.7 10*3/uL (ref 1.7–7.7)
Neutrophils Relative %: 83 %
Platelets: 102 10*3/uL — ABNORMAL LOW (ref 150–400)
RBC: 3.91 MIL/uL (ref 3.87–5.11)
RDW: 12.8 % (ref 11.5–15.5)
WBC: 4.5 10*3/uL (ref 4.0–10.5)
nRBC: 0 % (ref 0.0–0.2)

## 2021-03-14 LAB — PROTIME-INR
INR: 2.1 — ABNORMAL HIGH (ref 0.8–1.2)
Prothrombin Time: 23.3 seconds — ABNORMAL HIGH (ref 11.4–15.2)

## 2021-03-14 LAB — LACTIC ACID, PLASMA: Lactic Acid, Venous: 1.1 mmol/L (ref 0.5–1.9)

## 2021-03-14 LAB — RESP PANEL BY RT-PCR (FLU A&B, COVID) ARPGX2
Influenza A by PCR: NEGATIVE
Influenza B by PCR: NEGATIVE
SARS Coronavirus 2 by RT PCR: POSITIVE — AB

## 2021-03-14 LAB — APTT: aPTT: 66 seconds — ABNORMAL HIGH (ref 24–36)

## 2021-03-14 MED ORDER — BEBTELOVIMAB 175 MG/2 ML IV (EUA)
175.0000 mg | Freq: Once | INTRAMUSCULAR | Status: AC
  Administered 2021-03-14: 175 mg via INTRAVENOUS
  Filled 2021-03-14: qty 2

## 2021-03-14 MED ORDER — ALBUTEROL SULFATE HFA 108 (90 BASE) MCG/ACT IN AERS: 2.0000 | INHALATION_SPRAY | Freq: Once | RESPIRATORY_TRACT | Status: DC | PRN

## 2021-03-14 MED ORDER — TETANUS-DIPHTH-ACELL PERTUSSIS 5-2.5-18.5 LF-MCG/0.5 IM SUSY
0.5000 mL | PREFILLED_SYRINGE | Freq: Once | INTRAMUSCULAR | Status: AC
  Administered 2021-03-14: 0.5 mL via INTRAMUSCULAR
  Filled 2021-03-14: qty 0.5

## 2021-03-14 MED ORDER — SODIUM CHLORIDE 0.9 % IV BOLUS
1000.0000 mL | Freq: Once | INTRAVENOUS | Status: AC
  Administered 2021-03-14: 1000 mL via INTRAVENOUS

## 2021-03-14 MED ORDER — METHYLPREDNISOLONE SODIUM SUCC 125 MG IJ SOLR: 125.0000 mg | Freq: Once | INTRAMUSCULAR | Status: DC | PRN

## 2021-03-14 MED ORDER — DIPHENHYDRAMINE HCL 50 MG/ML IJ SOLN: 50.0000 mg | Freq: Once | INTRAMUSCULAR | Status: DC | PRN

## 2021-03-14 MED ORDER — ACETAMINOPHEN 325 MG PO TABS
650.0000 mg | ORAL_TABLET | Freq: Once | ORAL | Status: AC
  Administered 2021-03-14: 650 mg via ORAL
  Filled 2021-03-14: qty 2

## 2021-03-14 MED ORDER — EPINEPHRINE 0.3 MG/0.3ML IJ SOAJ: 0.3000 mg | Freq: Once | INTRAMUSCULAR | Status: DC | PRN

## 2021-03-14 MED ORDER — FAMOTIDINE IN NACL 20-0.9 MG/50ML-% IV SOLN: 20.0000 mg | Freq: Once | INTRAVENOUS | Status: DC | PRN

## 2021-03-14 MED ORDER — SODIUM CHLORIDE 0.9 % IV SOLN: INTRAVENOUS | Status: DC | PRN

## 2021-03-14 NOTE — Progress Notes (Signed)
Orthopedic Tech Progress Note Patient Details:  Christy Sutton 05/03/1926 [id number]  Level 2 Trauma.  Patient ID: Christy Sutton, female   DOB: 1926-03-01, 87 y.o.   MRN: [id number]  [name] 03/14/2021, 2:46 PM

## 2021-03-14 NOTE — ED Notes (Signed)
Pt maintained sats of 95% on RA while ambulating with walker.

## 2021-03-14 NOTE — Discharge Instructions (Addendum)
COVID test today was positive.  Christy Sutton will need to quarantine per CDC guidelines for the next 10 days.   Recommend she follow-up with her primary care doctor for recheck, in 1 week, sooner if needed for problems.  We repeated the CAT scan because of the initial concern for a small amount of bleeding inside the head.  On the repeat test there was no change, and the area of concern likely represents calcification not an actual injury.  If she develops more problems including headaches, weakness, dizziness, cannot eat or drink, have her see her doctor or return here.  It is okay to take Tylenol for pain and fever.  Make sure that she wears a mask around other people for at least 5 days, longer if having persistent symptoms including fever or cough.  She was treated with the monoclonal antibody against COVID, to help her recover quicker.

## 2021-03-14 NOTE — ED Notes (Signed)
Neurosurgery called

## 2021-03-14 NOTE — ED Provider Notes (Signed)
[facility] EMERGENCY DEPARTMENT Provider Note   CSN: [id number] Arrival date & time: 03/14/21  1411     History Chief Complaint  Patient presents with   Christy Sutton is a 87 y.o. female with past medical history significant for afib, diabetes, hypertension, hyperlipidemia, stroke, TIA.  Anticoagulated on Plavix.  HPI Patient presents to emergency department today via EMS coming from home with chief complaint of fall happening just prior to arrival.  Patient was trying to get out of her recliner chair to reach her walker and fell.  Patient called her family to let them know she had fallen and asked for help.  She lives with her grandson and her son lives down the road.  Patient is typically able to ambulate with a walker.  She denies any prodrome of feeling dizzy, lightheaded or having any chest pain prior to the fall.  She states she tripped. Patient denies being in any pain.  She denies loss of consciousness or hitting her head.  She states she landed on her left side on her elbow.  No medications were given for symptoms prior to arrival. Patient found to be febrile to 100 in triage.  She denies any URI symptoms, abdominal pain, urinary symptoms or diarrhea.  Patient denies any headache, neck pain, numbness, weakness or tingling.  No history obtained from patient's daughter-in-law.  She states that she tested positive for COVID x9 days ago.  She has been doing well, she has had contact with patient with last interaction being 5 days ago. Past Medical History:  Diagnosis Date   Atrial fibrillation ([place])    Diabetes mellitus without complication ([place])    Hyperlipidemia    Hypertension    Stroke ([place])    x 3   TIA (transient ischemic attack)     Patient Active Problem List   Diagnosis Date Noted   Bilateral leg edema 04/20/2017   Atrial fibrillation, chronic ([place]) 04/20/2017   Left-sided weakness 04/20/2017   Hyperlipidemia 04/04/2017   Left hemiparesis  (HCC)    Acute blood loss anemia    Benign essential HTN    Hypokalemia    Hypoalbuminemia due to protein-calorie malnutrition (HCC)    Stroke due to embolism ([place]) 03/09/2017   Diabetes mellitus type 2 in nonobese [facility])    History of CVA (cerebrovascular accident)    Cerebral embolism with cerebral infarction 03/07/2017   Stroke ([place]) 01/17/2017   TIA (transient ischemic attack) 01/17/2017   Dysmetria 01/17/2017   Essential hypertension 01/17/2017   Diabetes mellitus with complication ([place]) 72/53/6644   Ischemic stroke [facility])     Past Surgical History:  Procedure Laterality Date   ABDOMINAL HYSTERECTOMY     due to tubal pregnancies   TEE WITHOUT CARDIOVERSION N/A 01/30/2017   Procedure: TRANSESOPHAGEAL ECHOCARDIOGRAM (TEE);  Surgeon: Adrian Prows, MD;  Location: [facility] ENDOSCOPY;  Service: Cardiovascular;  Laterality: N/A;   TUBAL LIGATION       OB History   No obstetric history on file.     Family History  Problem Relation Age of Onset   Heart attack Father    Stroke Father    Stroke Mother    Stroke Sister    Stroke Sister     Social History   Tobacco Use   Smoking status: Never   Smokeless tobacco: Never  Vaping Use   Vaping Use: Never used  Substance Use Topics   Alcohol use: No   Drug use: No  Home Medications Prior to Admission medications   Medication Sig Start Date End Date Taking? Authorizing Provider  aspirin 81 MG chewable tablet Chew 81 mg by mouth every evening.     [provider]  atenolol (TENORMIN) 25 MG tablet Take 1 tablet (25 mg total) by mouth daily. 03/30/17   Love, Ivan Anchors, PA-C  atorvastatin (LIPITOR) 20 MG tablet Take 1 tablet (20 mg total) by mouth daily at 6 PM. 03/30/17   Love, Ivan Anchors, PA-C  dabigatran (PRADAXA) 150 MG CAPS capsule Take 1 capsule (150 mg total) by mouth every 12 (twelve) hours. 03/30/17   Love, Ivan Anchors, PA-C  meclizine (ANTIVERT) 25 MG tablet Take 25 mg by mouth daily as needed for dizziness.    [provider]  Multiple Vitamin (MULTIVITAMIN) tablet Take 1 tablet by mouth daily.    [provider]    Allergies    Penicillins  Review of Systems   Review of Systems All other systems are reviewed and are negative for acute change except as noted in the HPI.  Physical Exam Updated Vital Signs BP (!) 124/57   Pulse 85   Temp (!) 101.5 F (38.6 C) (Rectal)   Resp 18   Ht 5\' 3"  (1.6 m)   Wt 68 kg   SpO2 97%   BMI 26.57 kg/m   Physical Exam Vitals and nursing note reviewed.  Constitutional:      General: She is not in acute distress.    Appearance: She is not ill-appearing.  HENT:     Head: Normocephalic and atraumatic.     Right Ear: Tympanic membrane and external ear normal.     Left Ear: Tympanic membrane and external ear normal.     Nose: Nose normal.     Mouth/Throat:     Mouth: Mucous membranes are moist.     Pharynx: Oropharynx is clear.  Eyes:     General: No scleral icterus.       Right eye: No discharge.        Left eye: No discharge.     Extraocular Movements: Extraocular movements intact.     Conjunctiva/sclera: Conjunctivae normal.     Pupils: Pupils are equal, round, and reactive to light.  Neck:     Vascular: No JVD.  Cardiovascular:     Rate and Rhythm: Normal rate. Rhythm irregular.     Pulses: Normal pulses.          Radial pulses are 2+ on the right side and 2+ on the left side.     Heart sounds: Normal heart sounds.  Pulmonary:     Comments: Lungs clear to auscultation in all fields. Symmetric chest rise. No wheezing, rales, or rhonchi. Abdominal:     Comments: Abdomen is soft, non-distended, and non-tender in all quadrants. No rigidity, no guarding. No peritoneal signs.  Musculoskeletal:        General: Normal range of motion.     Cervical back: Normal range of motion.     Comments: Patient palpated from head to toe without any bony tenderness.  Pelvis is stable.  Moving all extremities without signs of injury.  Superficial skin  tear on right elbow. Tender to palpation of left elbow without obvious deformity. Compartments in all extremities are soft. Neurovascularly intact distally.    Skin:    General: Skin is warm and dry.     Capillary Refill: Capillary refill takes less than 2 seconds.  Neurological:     Mental  Status: She is oriented to person, place, and time.     GCS: GCS eye subscore is 4. GCS verbal subscore is 5. GCS motor subscore is 6.     Comments: Fluent speech, no facial droop.  Psychiatric:        Behavior: Behavior normal.    ED Results / Procedures / Treatments   Labs (all labs ordered are listed, but only abnormal results are displayed) Labs Reviewed  RESP PANEL BY RT-PCR (FLU A&B, COVID) ARPGX2 - Abnormal; Notable for the following components:      Result Value   SARS Coronavirus 2 by RT PCR POSITIVE (*)    All other components within normal limits  COMPREHENSIVE METABOLIC PANEL - Abnormal; Notable for the following components:   Sodium 133 (*)    Glucose, Bld 141 (*)    Calcium 8.3 (*)    Total Protein 5.8 (*)    Albumin 3.2 (*)    AST 42 (*)    All other components within normal limits  CBC WITH DIFFERENTIAL/PLATELET - Abnormal; Notable for the following components:   Platelets 102 (*)    Lymphs Abs 0.2 (*)    All other components within normal limits  PROTIME-INR - Abnormal; Notable for the following components:   Prothrombin Time 23.3 (*)    INR 2.1 (*)    All other components within normal limits  APTT - Abnormal; Notable for the following components:   aPTT 66 (*)    All other components within normal limits  URINALYSIS, ROUTINE W REFLEX MICROSCOPIC - Abnormal; Notable for the following components:   Ketones, ur 5 (*)    All other components within normal limits  URINE CULTURE  CULTURE, BLOOD (ROUTINE X 2)  CULTURE, BLOOD (ROUTINE X 2)  LACTIC ACID, PLASMA  LACTIC ACID, PLASMA    EKG None  Radiology DG Elbow Complete Left  Result Date: 03/14/2021 CLINICAL DATA:   Left elbow pain after fall today. EXAM: LEFT ELBOW - COMPLETE 3+ VIEW COMPARISON:  None. FINDINGS: There is no evidence of fracture, dislocation, or joint effusion. There is no evidence of arthropathy or other focal bone abnormality. Minimal soft tissue edema posteriorly. IMPRESSION: Minimal posterior soft tissue edema. No fracture or subluxation. Electronically Signed   By: Keith Rake M.D.   On: 03/14/2021 16:12   CT Head Wo Contrast  Addendum Date: 03/14/2021   ADDENDUM REPORT: 03/14/2021 15:45 ADDENDUM: These results were called by telephone at the time of interpretation on 03/14/2021 at 3:42 pm to provider Dr. Kathrynn Humble, who verbally acknowledged these results. Electronically Signed   By: Iven Finn M.D.   On: 03/14/2021 15:45   Result Date: 03/14/2021 CLINICAL DATA:  Level 2 trauma.  Fall on blood thinners. EXAM: CT HEAD WITHOUT CONTRAST CT CERVICAL SPINE WITHOUT CONTRAST TECHNIQUE: Multidetector CT imaging of the head and cervical spine was performed following the standard protocol without intravenous contrast. Multiplanar CT image reconstructions of the cervical spine were also generated. COMPARISON:  CT head 04/20/2017, CT neck angiography 01/17/2017, CT C-spine 02/21/2019 FINDINGS: CT HEAD FINDINGS Brain: Patchy and confluent areas of decreased attenuation are noted throughout the deep and periventricular white matter of the cerebral hemispheres bilaterally, compatible with chronic microvascular ischemic disease. No evidence of large-territorial acute infarction. No parenchymal hemorrhage. No mass lesion. Thin hyperdensity along the right falx ceribri (5:14). Similar-appearing calcifications along the posterior limb of the right internal capsule and right pelvis. No mass effect or midline shift. No hydrocephalus. Basilar cisterns are patent. Vascular:  No hyperdense vessel. Atherosclerotic calcifications are present within the cavernous internal carotid arteries. Skull: No acute fracture or  focal lesion. Sinuses/Orbits: Mucosal thickening of bilateral sphenoid sinuses with associated hyperostosis of the sinus walls. Paranasal sinuses and mastoid air cells are clear. The orbits are unremarkable. Other: None. CT CERVICAL SPINE FINDINGS Alignment: Normal. Skull base and vertebrae: Multilevel severe degenerative changes of the spine. No acute fracture. No aggressive appearing focal osseous lesion or focal pathologic process. Soft tissues and spinal canal: Similar-appearing odontoid pannus again noted. No prevertebral fluid or swelling. No visible canal hematoma. Upper chest: Marked biapical pleural/pulmonary scarring. Stable nodular like density at the left upper lobe measuring 0.9 cm. Other: None. IMPRESSION: 1. Thin hyperdensity along the right falx ceribri may represent a tiny parafalcine subdural hemorrhage versus artifact in the setting of motion. Recommend repeat CT head in 4-6 hours for evaluation of stability/resolution. 2. No acute displaced fracture or traumatic listhesis of the cervical spine. Electronically Signed: By: Iven Finn M.D. On: 03/14/2021 15:37   CT Cervical Spine Wo Contrast  Addendum Date: 03/14/2021   ADDENDUM REPORT: 03/14/2021 15:45 ADDENDUM: These results were called by telephone at the time of interpretation on 03/14/2021 at 3:42 pm to provider Dr. Kathrynn Humble, who verbally acknowledged these results. Electronically Signed   By: Iven Finn M.D.   On: 03/14/2021 15:45   Result Date: 03/14/2021 CLINICAL DATA:  Level 2 trauma.  Fall on blood thinners. EXAM: CT HEAD WITHOUT CONTRAST CT CERVICAL SPINE WITHOUT CONTRAST TECHNIQUE: Multidetector CT imaging of the head and cervical spine was performed following the standard protocol without intravenous contrast. Multiplanar CT image reconstructions of the cervical spine were also generated. COMPARISON:  CT head 04/20/2017, CT neck angiography 01/17/2017, CT C-spine 02/21/2019 FINDINGS: CT HEAD FINDINGS Brain: Patchy and  confluent areas of decreased attenuation are noted throughout the deep and periventricular white matter of the cerebral hemispheres bilaterally, compatible with chronic microvascular ischemic disease. No evidence of large-territorial acute infarction. No parenchymal hemorrhage. No mass lesion. Thin hyperdensity along the right falx ceribri (5:14). Similar-appearing calcifications along the posterior limb of the right internal capsule and right pelvis. No mass effect or midline shift. No hydrocephalus. Basilar cisterns are patent. Vascular: No hyperdense vessel. Atherosclerotic calcifications are present within the cavernous internal carotid arteries. Skull: No acute fracture or focal lesion. Sinuses/Orbits: Mucosal thickening of bilateral sphenoid sinuses with associated hyperostosis of the sinus walls. Paranasal sinuses and mastoid air cells are clear. The orbits are unremarkable. Other: None. CT CERVICAL SPINE FINDINGS Alignment: Normal. Skull base and vertebrae: Multilevel severe degenerative changes of the spine. No acute fracture. No aggressive appearing focal osseous lesion or focal pathologic process. Soft tissues and spinal canal: Similar-appearing odontoid pannus again noted. No prevertebral fluid or swelling. No visible canal hematoma. Upper chest: Marked biapical pleural/pulmonary scarring. Stable nodular like density at the left upper lobe measuring 0.9 cm. Other: None. IMPRESSION: 1. Thin hyperdensity along the right falx ceribri may represent a tiny parafalcine subdural hemorrhage versus artifact in the setting of motion. Recommend repeat CT head in 4-6 hours for evaluation of stability/resolution. 2. No acute displaced fracture or traumatic listhesis of the cervical spine. Electronically Signed: By: Iven Finn M.D. On: 03/14/2021 15:37   DG Chest Port 1 View  Result Date: 03/14/2021 CLINICAL DATA:  Questionable sepsis. EXAM: PORTABLE CHEST 1 VIEW COMPARISON:  Chest x-ray 04/20/2017 FINDINGS:  The heart size and mediastinal contours are unchanged. No focal consolidation. No pulmonary edema. No pleural effusion. No pneumothorax. No  acute osseous abnormality. IMPRESSION: No active disease. Electronically Signed   By: Iven Finn M.D.   On: 03/14/2021 15:52    Procedures Procedures   Medications Ordered in ED Medications  0.9 %  sodium chloride infusion (has no administration in time range)  bebtelovimab EUA injection SOLN 175 mg (has no administration in time range)  diphenhydrAMINE (BENADRYL) injection 50 mg (has no administration in time range)  famotidine (PEPCID) IVPB 20 mg premix (has no administration in time range)  methylPREDNISolone sodium succinate (SOLU-MEDROL) 125 mg/2 mL injection 125 mg (has no administration in time range)  albuterol (VENTOLIN HFA) 108 (90 Base) MCG/ACT inhaler 2 puff (has no administration in time range)  EPINEPHrine (EPI-PEN) injection 0.3 mg (has no administration in time range)  acetaminophen (TYLENOL) tablet 650 mg (650 mg Oral Given 03/14/21 1650)  sodium chloride 0.9 % bolus 1,000 mL (0 mLs Intravenous Stopped 03/14/21 1745)  Tdap (BOOSTRIX) injection 0.5 mL (0.5 mLs Intramuscular Given 03/14/21 1808)    ED Course  I have reviewed the triage vital signs and the nursing notes.  Pertinent labs & imaging results that were available during my care of the patient were reviewed by me and considered in my medical decision making (see chart for details).     MDM Rules/Calculators/A&P                          History provided by patient with additional history obtained from chart review and daughter in law over the phone. Presenting after mechanical fall. Level 2 trauma called by ED attending because she is on plavix. Found to be febrile in the ED arrival.  She has no complaints or URI symptoms.  She has been around her family members who tested positive for COVID x1 week ago.  Order set utilized for evolving sepsis.  Tylenol ordered.  Patient is  normotensive, will give a liter of normal saline.  Holding off on antibiotics until labs start to result. Plan to closely monitor. Fever could be in the setting of covid given close exposure.  Tetanus updated and bandage applied to skin tear.  Does not require suture repair. EKG shows rate controlled afib. CBC without leukocytosis, no anemia, does have platelet count of 102, has history of the same.  CMP with mild hyponatremia sodium 133, glucose 144, no other significant electrolyte derangement or renal insufficiency. INR 2.1 and PTT 66. Lactic acid is within normal range at 1.1. Blood cultures collected. UA is without signs of infection. Urine culture sent.  With reassuring labs still continuing to hold off on antibiotics.  COVID test is positive.  Patient does meet criteria for Mab infusion while in the ED based on her age and history of diabetes.  I discussed results with patient's daughter-in-law and she is agreeable with plan of care for mild infusion here.  Patient will need to ambulate prior to discharge to make sure she has no hypoxia which would require hospital admission.   Chest x-ray ordered for possible sepsis and shows no acute infectious process.  X-ray of left elbow without any signs of fracture or dislocation.  Heard CT shows thin hyperdensity along the right falx ceribri may represent a tiny parafalcine subdural hemorrhage versus artifact in the setting of motion.  CT cervical spine without fracture or dislocation.  Discussed head CT findings with on-call neurosurgeon Dr. Marcello Moores.  He viewed imaging and feels that subdural hemorrhage is less likely.  He agrees with plan for  repeat head CT in 4 hours.  If head CT does not show any changes she is stable for discharge home.   Patient care transferred to Dr. Eulis Foster at the end of my shift pending repeat head CT and ambulation trial/ Patient presentation, ED course, and plan of care discussed with review of all pertinent labs and imaging. Please see  his note for further details regarding further ED course and disposition.    Portions of this note were generated with [employer]. Dictation errors may occur despite best attempts at proofreading.     Final Clinical Impression(s) / ED Diagnoses Final diagnoses:  Fall, initial encounter  COVID    Rx / DC Orders ED Discharge Orders     None        Lewanda Rife 03/14/21 1932    Varney Biles, MD 03/15/21 [phone]

## 2021-03-14 NOTE — ED Notes (Signed)
Pt returned from CT °

## 2021-03-14 NOTE — ED Provider Notes (Signed)
  Face-to-face evaluation   History: Patient evaluated in the ED for fall, lives with family members.  No prodrome, prior to the fall.  Family members have COVID infection, within the last 10 days.  Patient has been asymptomatic.  Patient is here with her daughter-in law, who gives the history.  Patient does not have any symptoms of COVID, other than she was noted to have a fever today.  Physical exam: Alert elderly female who is calm and cooperative.  No respiratory distress.  No dysarthria or aphasia.  ED evaluation included urinalysis, CBC, metabolic panel, and radiographic imaging.  CT head was repeated for possibility of subdural hematoma. There is no concern for subdural on the repeat imaging.  No indication for further ED treatment, hospitalization, follow-up with neurosurgery.  Multiple contusions can be treated symptomatically.  COVID infection is incidental, unclear duration, but more likely for 5 days at least.  She has had COVID vaccines and boosters, and was treated with a monoclonal antibody in the ED.  Findings discussed with daughter-in-law discharge.   Medical screening examination/treatment/procedure(s) were conducted as a shared visit with non-physician practitioner(s) and myself.  I personally evaluated the patient during the encounter    Daleen Bo, MD 03/14/21 2127

## 2021-03-14 NOTE — ED Triage Notes (Signed)
Pt to ED via EMS from home . Pt had a mechanical fall when getting out of recliner chair trying to get to her walker, Small skin tear to right elbow. Pt did not hit her head, No LOC.  Pt voicing no complaints.#20 L. Last vs 120/60, 90HR irregular, RR18., CBG 166, 96%RA HX AFIB, HTN, Takes Plavix.

## 2021-03-14 NOTE — ED Notes (Signed)
DC instructions reviewed with pt/family. Pt/family verbalized instructions.  PT Dc.

## 2021-03-15 LAB — URINE CULTURE: Culture: NO GROWTH

## 2021-03-19 LAB — CULTURE, BLOOD (ROUTINE X 2)
Culture: NO GROWTH
Culture: NO GROWTH
Special Requests: ADEQUATE
Special Requests: ADEQUATE

## 2021-05-25 DIAGNOSIS — R58 Hemorrhage, not elsewhere classified: Secondary | ICD-10-CM | POA: Diagnosis not present

## 2021-05-25 DIAGNOSIS — Z743 Need for continuous supervision: Secondary | ICD-10-CM | POA: Diagnosis not present

## 2021-05-26 ENCOUNTER — Encounter (HOSPITAL_COMMUNITY): Payer: Self-pay

## 2021-05-26 ENCOUNTER — Emergency Department (HOSPITAL_COMMUNITY)
Admission: EM | Admit: 2021-05-26 | Discharge: 2021-05-26 | Disposition: A | Discharge status: Home / Self Care | Payer: Medicare Other | Attending: Emergency Medicine | Admitting: Emergency Medicine

## 2021-05-26 ENCOUNTER — Other Ambulatory Visit: Payer: Self-pay

## 2021-05-26 DIAGNOSIS — Z87891 Personal history of nicotine dependence: Secondary | ICD-10-CM | POA: Diagnosis not present

## 2021-05-26 DIAGNOSIS — Z7902 Long term (current) use of antithrombotics/antiplatelets: Secondary | ICD-10-CM | POA: Diagnosis not present

## 2021-05-26 DIAGNOSIS — I10 Essential (primary) hypertension: Secondary | ICD-10-CM | POA: Insufficient documentation

## 2021-05-26 DIAGNOSIS — Z7982 Long term (current) use of aspirin: Secondary | ICD-10-CM | POA: Diagnosis not present

## 2021-05-26 DIAGNOSIS — R040 Epistaxis: Secondary | ICD-10-CM | POA: Insufficient documentation

## 2021-05-26 DIAGNOSIS — I4891 Unspecified atrial fibrillation: Secondary | ICD-10-CM | POA: Diagnosis not present

## 2021-05-26 DIAGNOSIS — Z79899 Other long term (current) drug therapy: Secondary | ICD-10-CM | POA: Diagnosis not present

## 2021-05-26 DIAGNOSIS — E119 Type 2 diabetes mellitus without complications: Secondary | ICD-10-CM | POA: Insufficient documentation

## 2021-05-26 MED ORDER — SALINE SPRAY 0.65 % NA SOLN
1.0000 | Freq: Once | NASAL | Status: AC
  Administered 2021-05-26: 1 via NASAL
  Filled 2021-05-26: qty 44

## 2021-05-26 MED ORDER — LIDOCAINE-EPINEPHRINE-TETRACAINE (LET) TOPICAL GEL
3.0000 mL | Freq: Once | TOPICAL | Status: AC
  Administered 2021-05-26: 3 mL via TOPICAL
  Filled 2021-05-26: qty 3

## 2021-05-26 MED ORDER — SILVER NITRATE-POT NITRATE 75-25 % EX MISC
1.0000 "application " | Freq: Once | CUTANEOUS | Status: AC
  Administered 2021-05-26: 1 via TOPICAL
  Filled 2021-05-26: qty 10

## 2021-05-26 MED ORDER — OXYMETAZOLINE HCL 0.05 % NA SOLN
1.0000 | Freq: Once | NASAL | Status: AC
  Administered 2021-05-26: 1 via NASAL
  Filled 2021-05-26: qty 30

## 2021-05-26 NOTE — ED Triage Notes (Signed)
Patient BIB [facility] for epitaxies which started earlier in the day.EMS reports patient keeps rubbing/ blowing her nose. She was told to bend her head down and pinch her nose. Patient did not want to do that as she wants to keep blowing her nose. Patient does have some blood coming out of both nares and in her mouth from swallowing the blood. Patient is extremely hard of hearing

## 2021-05-26 NOTE — ED Provider Notes (Signed)
[facility] DEPT Provider Note   CSN: [id number] Arrival date & time: 05/26/21  0006     History Chief Complaint  Patient presents with   Epistaxis    Christy Sutton is a 87 y.o. female.  The history is provided by the patient, a relative and medical records.  Epistaxis Christy Sutton is a 87 y.o. female who presents to the Emergency Department complaining of epistaxis.  She presents to the ED by EMS for evaluation of epistaxis that started around 11am today.  She lives at home with her grandson.  Daughter is available to provide additional hx (patient very hard of hearing).  She has been bleeding from the left nare intermittently throughout the day.  Gets occasional nose bleeds but none this severe.  Takes pradaxa due to hx/o afib and multiple prior strokes.    Labs reviewed in Epic.  Pt with plt 102 in July.      Past Medical History:  Diagnosis Date   Atrial fibrillation ([place])    Diabetes mellitus without complication ([place])    Hyperlipidemia    Hypertension    Stroke ([place])    x 3   TIA (transient ischemic attack)     Patient Active Problem List   Diagnosis Date Noted   Bilateral leg edema 04/20/2017   Atrial fibrillation, chronic ([place]) 04/20/2017   Left-sided weakness 04/20/2017   Hyperlipidemia 04/04/2017   Left hemiparesis (HCC)    Acute blood loss anemia    Benign essential HTN    Hypokalemia    Hypoalbuminemia due to protein-calorie malnutrition (HCC)    Stroke due to embolism ([place]) 03/09/2017   Diabetes mellitus type 2 in nonobese [facility])    History of CVA (cerebrovascular accident)    Cerebral embolism with cerebral infarction 03/07/2017   Stroke ([place]) 01/17/2017   TIA (transient ischemic attack) 01/17/2017   Dysmetria 01/17/2017   Essential hypertension 01/17/2017   Diabetes mellitus with complication ([place]) 0000000   Ischemic stroke [facility])     Past Surgical History:  Procedure Laterality Date   ABDOMINAL HYSTERECTOMY      due to tubal pregnancies   TEE WITHOUT CARDIOVERSION N/A 01/30/2017   Procedure: TRANSESOPHAGEAL ECHOCARDIOGRAM (TEE);  Surgeon: Adrian Prows, MD;  Location: [facility] ENDOSCOPY;  Service: Cardiovascular;  Laterality: N/A;   TUBAL LIGATION       OB History   No obstetric history on file.     Family History  Problem Relation Age of Onset   Heart attack Father    Stroke Father    Stroke Mother    Stroke Sister    Stroke Sister     Social History   Tobacco Use   Smoking status: Former    Types: Cigarettes   Smokeless tobacco: Never  Vaping Use   Vaping Use: Never used  Substance Use Topics   Alcohol use: No   Drug use: No    Home Medications Prior to Admission medications   Medication Sig Start Date End Date Taking? Authorizing Provider  aspirin 81 MG chewable tablet Chew 81 mg by mouth every evening.     [provider]  atenolol (TENORMIN) 25 MG tablet Take 1 tablet (25 mg total) by mouth daily. 03/30/17   Love, Ivan Anchors, PA-C  atorvastatin (LIPITOR) 20 MG tablet Take 1 tablet (20 mg total) by mouth daily at 6 PM. 03/30/17   Love, Ivan Anchors, PA-C  clopidogrel (PLAVIX) 75 MG tablet Take 75 mg by mouth daily. 04/15/21   [provider]  dabigatran (PRADAXA) 150 MG CAPS capsule Take 1 capsule (150 mg total) by mouth every 12 (twelve) hours. 03/30/17   Love, Ivan Anchors, PA-C  furosemide (LASIX) 20 MG tablet Take 20 mg by mouth 2 (two) times daily as needed for edema. 03/27/21   [provider]  KLOR-CON M20 20 MEQ tablet Take 20 mEq by mouth 3 (three) times a week. 03/28/21   [provider]  meclizine (ANTIVERT) 25 MG tablet Take 25 mg by mouth daily as needed for dizziness.    [provider]  Multiple Vitamin (MULTIVITAMIN) tablet Take 1 tablet by mouth daily.    [provider]  simvastatin (ZOCOR) 40 MG tablet Take 40 mg by mouth daily. 04/15/21   [provider]    Allergies    Penicillins  Review of Systems   Review of  Systems  HENT:  Positive for nosebleeds.   All other systems reviewed and are negative.  Physical Exam Updated Vital Signs BP 136/78 (BP Location: Left Arm)   Pulse (!) 108   Temp 97.9 F (36.6 C) (Oral)   Resp 20   Ht '5\' 3"'$  (1.6 m)   Wt 68 kg   SpO2 99%   BMI 26.56 kg/m   Physical Exam Vitals and nursing note reviewed.  Constitutional:      Appearance: She is well-developed.  HENT:     Head: Normocephalic and atraumatic.     Comments: Dry blood in the posterior oropharynx. There is bright red blood in the left nare, concentrated at the anterior inferior portion of the nasal septum Cardiovascular:     Rate and Rhythm: Regular rhythm. Tachycardia present.  Pulmonary:     Effort: Pulmonary effort is normal. No respiratory distress.  Musculoskeletal:        General: No tenderness.  Skin:    General: Skin is warm and dry.  Neurological:     Mental Status: She is alert.     Comments: Very hard of hearing.  Oriented to person and recent events.   Psychiatric:        Behavior: Behavior normal.    ED Results / Procedures / Treatments   Labs (all labs ordered are listed, but only abnormal results are displayed) Labs Reviewed - No data to display  EKG None  Radiology No results found.  Procedures .Epistaxis Management  Date/Time: 05/26/2021 2:34 AM Performed by: Quintella Reichert, MD Authorized by: Quintella Reichert, MD   Consent:    Consent obtained:  Verbal   Consent given by:  Patient   Risks discussed:  Bleeding, nasal injury and pain Universal protocol:    Patient identity confirmed:  Verbally with patient Anesthesia:    Anesthesia method:  Topical application   Topical anesthetic:  LET Procedure details:    Treatment site:  L anterior   Treatment method:  Silver nitrate   Treatment episode: recurring   Post-procedure details:    Assessment:  Bleeding decreased   Procedure completion:  Tolerated well, no immediate complications Comments:     Patient was  anesthetized with let gel. After removal of the gel packing her nerves were cleared by blowing. Then Afrin was applied to the left nare. The area of bleeding was identified and silver nitrate was applied. On reassessment there was hemostasis   Medications Ordered in ED Medications  silver nitrate applicators applicator 1 application (has no administration in time range)  sodium chloride (OCEAN) 0.65 % nasal spray 1 spray (has no administration in  time range)  oxymetazoline (AFRIN) 0.05 % nasal spray 1 spray (1 spray Each Nare Given 05/26/21 0118)  lidocaine-EPINEPHrine-tetracaine (LET) topical gel (3 mLs Topical Given 05/26/21 0118)    ED Course  I have reviewed the triage vital signs and the nursing notes.  Pertinent labs & imaging results that were available during my care of the patient were reviewed by me and considered in my medical decision making (see chart for details).    MDM Rules/Calculators/A&P                          patient with history of atrial fibrillation on Pradaxa here for evaluation of epistaxis. She has excited bleeding at the anterior portion of the left nare. Hemostasis was achieved after silver nitrate application. Discussed with patient and daughter home care for epistaxis. Discussed ENT follow-up and return precautions  Final Clinical Impression(s) / ED Diagnoses Final diagnoses:  Anterior epistaxis    Rx / DC Orders ED Discharge Orders     None        Quintella Reichert, MD 05/26/21 [phone]

## 2021-08-04 ENCOUNTER — Emergency Department (HOSPITAL_COMMUNITY)
Admission: EM | Admit: 2021-08-04 | Discharge: 2021-08-04 | Disposition: A | Discharge status: Home / Self Care | Payer: Medicare Other | Attending: Emergency Medicine | Admitting: Emergency Medicine

## 2021-08-04 ENCOUNTER — Emergency Department (HOSPITAL_COMMUNITY): Payer: Medicare Other

## 2021-08-04 ENCOUNTER — Encounter (HOSPITAL_COMMUNITY): Payer: Self-pay

## 2021-08-04 DIAGNOSIS — I4891 Unspecified atrial fibrillation: Secondary | ICD-10-CM | POA: Diagnosis not present

## 2021-08-04 DIAGNOSIS — W1789XA Other fall from one level to another, initial encounter: Secondary | ICD-10-CM | POA: Diagnosis not present

## 2021-08-04 DIAGNOSIS — Y9301 Activity, walking, marching and hiking: Secondary | ICD-10-CM | POA: Insufficient documentation

## 2021-08-04 DIAGNOSIS — Y92019 Unspecified place in single-family (private) house as the place of occurrence of the external cause: Secondary | ICD-10-CM | POA: Insufficient documentation

## 2021-08-04 DIAGNOSIS — Z87891 Personal history of nicotine dependence: Secondary | ICD-10-CM | POA: Insufficient documentation

## 2021-08-04 DIAGNOSIS — I10 Essential (primary) hypertension: Secondary | ICD-10-CM | POA: Insufficient documentation

## 2021-08-04 DIAGNOSIS — Z7982 Long term (current) use of aspirin: Secondary | ICD-10-CM | POA: Insufficient documentation

## 2021-08-04 DIAGNOSIS — S59902A Unspecified injury of left elbow, initial encounter: Secondary | ICD-10-CM | POA: Diagnosis not present

## 2021-08-04 DIAGNOSIS — S0003XA Contusion of scalp, initial encounter: Secondary | ICD-10-CM | POA: Diagnosis not present

## 2021-08-04 DIAGNOSIS — S51012A Laceration without foreign body of left elbow, initial encounter: Secondary | ICD-10-CM | POA: Insufficient documentation

## 2021-08-04 DIAGNOSIS — W19XXXA Unspecified fall, initial encounter: Secondary | ICD-10-CM

## 2021-08-04 DIAGNOSIS — M542 Cervicalgia: Secondary | ICD-10-CM | POA: Diagnosis not present

## 2021-08-04 DIAGNOSIS — E119 Type 2 diabetes mellitus without complications: Secondary | ICD-10-CM | POA: Diagnosis not present

## 2021-08-04 DIAGNOSIS — S0990XA Unspecified injury of head, initial encounter: Secondary | ICD-10-CM | POA: Insufficient documentation

## 2021-08-04 NOTE — ED Provider Notes (Signed)
Emergency Medicine Provider Triage Evaluation Note  Christy Sutton , a 87 y.o. female  was evaluated in triage.  Pt complains of a fall that occurred yesterday.  Patient states she was walking in her hall and tripped and believes that she fell and hit the wall.  Patient reports pain as well as a wound to her left elbow and has had difficulty controlling the bleeding for the last 24 hours.  Also believes that she hit her posterior scalp and notes a hematoma in the region.  Patient states she is anticoagulated on Pradaxa.  Physical Exam  BP 128/69 (BP Location: Right Arm)   Pulse 100   Temp 98 F (36.7 C) (Oral)   Resp 16   Ht 5\' 3"  (1.6 m)   Wt 67.6 kg   SpO2 96%   BMI 26.39 kg/m  Gen:   Awake, no distress   Resp:  Normal effort  MSK:   Moves extremities without difficulty  Other:  Left elbow is wrapped with gauze and is mildly soaked with blood.  Small hematoma noted to the posterior scalp.  Medical Decision Making  Medically screening exam initiated at 9:51 PM.  Appropriate orders placed.  Ryhanna Dunsmore was informed that the remainder of the evaluation will be completed by another provider, this initial triage assessment does not replace that evaluation, and the importance of remaining in the ED until their evaluation is complete.   Rayna Sexton, PA-C 08/04/21 2152    Fredia Sorrow, MD 08/10/21 [phone]

## 2021-08-04 NOTE — Discharge Instructions (Signed)
Keep the dressing on for 24 hours.  After that, you can change the dressing and use antibiotic ointment.  Change the dressing daily.  Your head CT showed no evidence of bleeding.  If you have any worsening headaches, vomiting, difficulty walking, change in your mental status or other worsening symptoms, return to the emergency room immediately.

## 2021-08-04 NOTE — ED Triage Notes (Signed)
Patient arrives from home with complaint of fall that happened yesterday. Pt has bleeding wound to elbow that, taking pradaxa. Pt did hit head, No LOC.

## 2021-08-04 NOTE — ED Provider Notes (Signed)
[facility] DEPT Provider Note   CSN: [id number] Arrival date & time: 08/04/21  2129     History Chief Complaint  Patient presents with   Christy Sutton is a 87 y.o. female.  Patient is a 87 year old female with a history of atrial fibrillation on Pradaxa, diabetes, hypertension, hyperlipidemia, prior stroke who presents with elbow bleeding after a fall.  The fall happened yesterday.  She says she was walking in the hall and got briefly off balance, causing her to fall into the wall.  She says that happens from time to time it is not unusual.  She has not had any dizziness, palpitations, chest pain or shortness of breath.  Her family is with her and said she has not been at all off balance today.  She has been in her normal mental status.  She has a hematoma on the back of her head from where she fell into the wall.  She also scraped her left elbow and its been oozing off and on since yesterday.  She denies any other injuries.  Her tetanus shot is up-to-date.      Past Medical History:  Diagnosis Date   Atrial fibrillation ([place])    Diabetes mellitus without complication ([place])    Hyperlipidemia    Hypertension    Stroke ([place])    x 3   TIA (transient ischemic attack)     Patient Active Problem List   Diagnosis Date Noted   Bilateral leg edema 04/20/2017   Atrial fibrillation, chronic ([place]) 04/20/2017   Left-sided weakness 04/20/2017   Hyperlipidemia 04/04/2017   Left hemiparesis (HCC)    Acute blood loss anemia    Benign essential HTN    Hypokalemia    Hypoalbuminemia due to protein-calorie malnutrition (HCC)    Stroke due to embolism ([place]) 03/09/2017   Diabetes mellitus type 2 in nonobese [facility])    History of CVA (cerebrovascular accident)    Cerebral embolism with cerebral infarction 03/07/2017   Stroke ([place]) 01/17/2017   TIA (transient ischemic attack) 01/17/2017   Dysmetria 01/17/2017   Essential hypertension 01/17/2017    Diabetes mellitus with complication ([place]) 40/06/2724   Ischemic stroke [facility])     Past Surgical History:  Procedure Laterality Date   ABDOMINAL HYSTERECTOMY     due to tubal pregnancies   TEE WITHOUT CARDIOVERSION N/A 01/30/2017   Procedure: TRANSESOPHAGEAL ECHOCARDIOGRAM (TEE);  Surgeon: Adrian Prows, MD;  Location: [facility] ENDOSCOPY;  Service: Cardiovascular;  Laterality: N/A;   TUBAL LIGATION       OB History   No obstetric history on file.     Family History  Problem Relation Age of Onset   Heart attack Father    Stroke Father    Stroke Mother    Stroke Sister    Stroke Sister     Social History   Tobacco Use   Smoking status: Former    Types: Cigarettes   Smokeless tobacco: Never  Vaping Use   Vaping Use: Never used  Substance Use Topics   Alcohol use: No   Drug use: No    Home Medications Prior to Admission medications   Medication Sig Start Date End Date Taking? Authorizing Provider  aspirin 81 MG chewable tablet Chew 81 mg by mouth every evening.     [provider]  atenolol (TENORMIN) 25 MG tablet Take 1 tablet (25 mg total) by mouth daily. 03/30/17   Love, Ivan Anchors, PA-C  atorvastatin (LIPITOR) 20 MG  tablet Take 1 tablet (20 mg total) by mouth daily at 6 PM. 03/30/17   Love, Ivan Anchors, PA-C  clopidogrel (PLAVIX) 75 MG tablet Take 75 mg by mouth daily. 04/15/21   [provider]  dabigatran (PRADAXA) 150 MG CAPS capsule Take 1 capsule (150 mg total) by mouth every 12 (twelve) hours. 03/30/17   Love, Ivan Anchors, PA-C  furosemide (LASIX) 20 MG tablet Take 20 mg by mouth 2 (two) times daily as needed for edema. 03/27/21   [provider]  KLOR-CON M20 20 MEQ tablet Take 20 mEq by mouth 3 (three) times a week. 03/28/21   [provider]  meclizine (ANTIVERT) 25 MG tablet Take 25 mg by mouth daily as needed for dizziness.    [provider]  Multiple Vitamin (MULTIVITAMIN) tablet Take 1 tablet by mouth daily.    [provider]  simvastatin (ZOCOR) 40 MG tablet Take 40 mg by mouth daily. 04/15/21   [provider]    Allergies    Penicillins  Review of Systems   Review of Systems  Constitutional:  Negative for chills, diaphoresis, fatigue and fever.  HENT:  Negative for congestion, rhinorrhea and sneezing.   Eyes: Negative.   Respiratory:  Negative for cough, chest tightness and shortness of breath.   Cardiovascular:  Negative for chest pain and leg swelling.  Gastrointestinal:  Negative for abdominal pain, blood in stool, diarrhea, nausea and vomiting.  Genitourinary:  Negative for difficulty urinating, flank pain, frequency and hematuria.  Musculoskeletal:  Positive for arthralgias. Negative for back pain.  Skin:  Positive for wound. Negative for rash.  Neurological:  Negative for dizziness, speech difficulty, weakness, numbness and headaches.   Physical Exam Updated Vital Signs BP 128/69 (BP Location: Right Arm)   Pulse 100   Temp 98 F (36.7 C) (Oral)   Resp 16   Ht 5\' 3"  (1.6 m)   Wt 67.6 kg   SpO2 96%   BMI 26.39 kg/m   Physical Exam Constitutional:      Appearance: She is well-developed.  HENT:     Head: Normocephalic.     Comments: Small hematoma to the posterior scalp Eyes:     Pupils: Pupils are equal, round, and reactive to light.  Neck:     Comments: No pain to the cervical, thoracic or lumbosacral spine Cardiovascular:     Rate and Rhythm: Normal rate and regular rhythm.     Heart sounds: Normal heart sounds.  Pulmonary:     Effort: Pulmonary effort is normal. No respiratory distress.     Breath sounds: Normal breath sounds. No wheezing or rales.  Chest:     Chest wall: No tenderness.  Abdominal:     General: Bowel sounds are normal.     Palpations: Abdomen is soft.     Tenderness: There is no abdominal tenderness. There is no guarding or rebound.  Musculoskeletal:        General: Normal range of motion.     Comments: Small skin tear over left elbow.  There is  some ecchymosis around the elbow.  No bony tenderness.  There are some oozing from the wound.  There is no other pain on palpation or range of motion of the extremities, including the hips  Lymphadenopathy:     Cervical: No cervical adenopathy.  Skin:    General: Skin is warm and dry.     Findings: No rash.  Neurological:     Mental Status: She is alert  and oriented to person, place, and time.    ED Results / Procedures / Treatments   Labs (all labs ordered are listed, but only abnormal results are displayed) Labs Reviewed - No data to display  EKG None  Radiology DG Elbow Complete Left  Result Date: 08/04/2021 CLINICAL DATA:  Fall and trauma to the left upper extremity. EXAM: LEFT ELBOW - COMPLETE 3+ VIEW COMPARISON:  Left foot radiograph dated 03/14/2021. FINDINGS: No acute fracture or dislocation. Mild osteopenia. No significant arthritic changes. No joint effusion. The soft tissues are grossly unremarkable. IMPRESSION: Negative. Electronically Signed   By: Anner Crete M.D.   On: 08/04/2021 22:08   CT HEAD WO CONTRAST (5MM)  Result Date: 08/04/2021 CLINICAL DATA:  Neck pain acute, no red flags. Fall occurred yesterday. Head trauma. EXAM: CT HEAD WITHOUT CONTRAST CT CERVICAL SPINE WITHOUT CONTRAST TECHNIQUE: Multidetector CT imaging of the head and cervical spine was performed following the standard protocol without intravenous contrast. Multiplanar CT image reconstructions of the cervical spine were also generated. COMPARISON:  03/14/2021. FINDINGS: CT HEAD FINDINGS Brain: No acute intracranial hemorrhage, midline shift or mass effect. No extra-axial fluid collection. There is mild generalized atrophy. Periventricular white matter hypodensities are present bilaterally and there is no hydrocephalus. Coarse calcifications are present in the basal ganglia bilaterally, greater on the right than on the left. Vascular: No hyperdense vessel or unexpected calcification. Skull: Normal.  Negative for fracture or focal lesion. Sinuses/Orbits: There is partial opacification of the sphenoid sinuses bilaterally. The orbits are within normal limits. Other: A small scalp hematoma is noted over the parietal bone on the left. CT CERVICAL SPINE FINDINGS Alignment: Normal. Skull base and vertebrae: No acute fracture. No primary bone lesion or focal pathologic process. Soft tissues and spinal canal: No prevertebral fluid or swelling. No visible canal hematoma. Disc levels: Multilevel intervertebral disc space narrowing, osteophyte formation and facet arthropathy is noted resulting in mild-to-moderate spinal canal stenosis. Moderate to severe neural foraminal stenosis is present at multiple levels. Upper chest: Apical pleural thickening is noted bilaterally, which is unchanged from the prior exam. There is a stable nodular density in the left upper lobe measuring 9 mm. Other: None. IMPRESSION: 1. No acute intracranial hemorrhage. 2. Scalp hematoma over the parietal bone on the left. 3. Atrophy with chronic microvascular ischemic changes. 4. Multilevel degenerative changes in the cervical spine with no evidence of acute fracture. Electronically Signed   By: Brett Fairy M.D.   On: 08/04/2021 22:41   CT Cervical Spine Wo Contrast  Result Date: 08/04/2021 CLINICAL DATA:  Neck pain acute, no red flags. Fall occurred yesterday. Head trauma. EXAM: CT HEAD WITHOUT CONTRAST CT CERVICAL SPINE WITHOUT CONTRAST TECHNIQUE: Multidetector CT imaging of the head and cervical spine was performed following the standard protocol without intravenous contrast. Multiplanar CT image reconstructions of the cervical spine were also generated. COMPARISON:  03/14/2021. FINDINGS: CT HEAD FINDINGS Brain: No acute intracranial hemorrhage, midline shift or mass effect. No extra-axial fluid collection. There is mild generalized atrophy. Periventricular white matter hypodensities are present bilaterally and there is no hydrocephalus.  Coarse calcifications are present in the basal ganglia bilaterally, greater on the right than on the left. Vascular: No hyperdense vessel or unexpected calcification. Skull: Normal. Negative for fracture or focal lesion. Sinuses/Orbits: There is partial opacification of the sphenoid sinuses bilaterally. The orbits are within normal limits. Other: A small scalp hematoma is noted over the parietal bone on the left. CT CERVICAL SPINE FINDINGS Alignment: Normal. Skull base  and vertebrae: No acute fracture. No primary bone lesion or focal pathologic process. Soft tissues and spinal canal: No prevertebral fluid or swelling. No visible canal hematoma. Disc levels: Multilevel intervertebral disc space narrowing, osteophyte formation and facet arthropathy is noted resulting in mild-to-moderate spinal canal stenosis. Moderate to severe neural foraminal stenosis is present at multiple levels. Upper chest: Apical pleural thickening is noted bilaterally, which is unchanged from the prior exam. There is a stable nodular density in the left upper lobe measuring 9 mm. Other: None. IMPRESSION: 1. No acute intracranial hemorrhage. 2. Scalp hematoma over the parietal bone on the left. 3. Atrophy with chronic microvascular ischemic changes. 4. Multilevel degenerative changes in the cervical spine with no evidence of acute fracture. Electronically Signed   By: Brett Fairy M.D.   On: 08/04/2021 22:41    Procedures Procedures   Medications Ordered in ED Medications - No data to display  ED Course  I have reviewed the triage vital signs and the nursing notes.  Pertinent labs & imaging results that were available during my care of the patient were reviewed by me and considered in my medical decision making (see chart for details).    MDM Rules/Calculators/A&P                           Patient presents after a fall yesterday.  She said she got briefly off balance which she has done in the past.  She has not had any  ongoing balance issues or ataxia.  She does not have any current symptoms of a stroke.  No dizziness, chest pain, palpitations or other symptoms preceding the fall.  No recent illnesses.  At this point it does not seem that lab work is indicated.  She did have a CT scan of her head and cervical spine which showed no acute abnormality.  She is on Pradaxa.  She has a skin tear to her left elbow.  There is no underlying fracture on imaging studies.  The wound was dressed with Surgifoam and gauze.  There is been no ongoing bleeding.  She was discharged home in good condition with her family.  They were given wound care instructions and advised to leave the dressing on for 24 hours.  They were advised to follow-up with her PCP for recheck.  Return precautions were given. Final Clinical Impression(s) / ED Diagnoses Final diagnoses:  Fall, initial encounter  Skin tear of left elbow without complication, initial encounter  Injury of head, initial encounter    Rx / DC Orders ED Discharge Orders     None        Malvin Johns, MD 08/04/21 2321

## 2021-09-16 DIAGNOSIS — Z20822 Contact with and (suspected) exposure to covid-19: Secondary | ICD-10-CM | POA: Diagnosis not present

## 2021-12-21 DIAGNOSIS — R051 Acute cough: Secondary | ICD-10-CM | POA: Diagnosis not present

## 2021-12-21 DIAGNOSIS — R059 Cough, unspecified: Secondary | ICD-10-CM | POA: Diagnosis not present

## 2021-12-21 DIAGNOSIS — Z20822 Contact with and (suspected) exposure to covid-19: Secondary | ICD-10-CM | POA: Diagnosis not present

## 2021-12-26 DIAGNOSIS — Z1331 Encounter for screening for depression: Secondary | ICD-10-CM | POA: Diagnosis not present

## 2021-12-26 DIAGNOSIS — I10 Essential (primary) hypertension: Secondary | ICD-10-CM | POA: Diagnosis not present

## 2021-12-26 DIAGNOSIS — Z20822 Contact with and (suspected) exposure to covid-19: Secondary | ICD-10-CM | POA: Diagnosis not present

## 2021-12-26 DIAGNOSIS — E039 Hypothyroidism, unspecified: Secondary | ICD-10-CM | POA: Diagnosis not present

## 2021-12-26 DIAGNOSIS — Z0000 Encounter for general adult medical examination without abnormal findings: Secondary | ICD-10-CM | POA: Diagnosis not present

## 2021-12-26 DIAGNOSIS — H9193 Unspecified hearing loss, bilateral: Secondary | ICD-10-CM | POA: Diagnosis not present

## 2021-12-26 DIAGNOSIS — Z1339 Encounter for screening examination for other mental health and behavioral disorders: Secondary | ICD-10-CM | POA: Diagnosis not present

## 2021-12-26 DIAGNOSIS — R82998 Other abnormal findings in urine: Secondary | ICD-10-CM | POA: Diagnosis not present

## 2021-12-26 DIAGNOSIS — R2681 Unsteadiness on feet: Secondary | ICD-10-CM | POA: Diagnosis not present

## 2021-12-26 DIAGNOSIS — L570 Actinic keratosis: Secondary | ICD-10-CM | POA: Diagnosis not present

## 2021-12-26 DIAGNOSIS — E1151 Type 2 diabetes mellitus with diabetic peripheral angiopathy without gangrene: Secondary | ICD-10-CM | POA: Diagnosis not present

## 2021-12-26 DIAGNOSIS — E785 Hyperlipidemia, unspecified: Secondary | ICD-10-CM | POA: Diagnosis not present

## 2021-12-26 DIAGNOSIS — I480 Paroxysmal atrial fibrillation: Secondary | ICD-10-CM | POA: Diagnosis not present

## 2021-12-27 DIAGNOSIS — Z20822 Contact with and (suspected) exposure to covid-19: Secondary | ICD-10-CM | POA: Diagnosis not present

## 2022-01-15 DIAGNOSIS — Z20822 Contact with and (suspected) exposure to covid-19: Secondary | ICD-10-CM | POA: Diagnosis not present

## 2022-01-16 DIAGNOSIS — Z20822 Contact with and (suspected) exposure to covid-19: Secondary | ICD-10-CM | POA: Diagnosis not present

## 2022-10-27 ENCOUNTER — Emergency Department (HOSPITAL_COMMUNITY): Payer: Medicare Other

## 2022-10-27 ENCOUNTER — Inpatient Hospital Stay (HOSPITAL_COMMUNITY)
Admission: EM | Admit: 2022-10-27 | Discharge: 2022-11-07 | DRG: 481 | Disposition: A | Discharge status: Skilled Nursing Facility | Payer: Medicare Other | Attending: Internal Medicine | Admitting: Internal Medicine

## 2022-10-27 DIAGNOSIS — W010XXA Fall on same level from slipping, tripping and stumbling without subsequent striking against object, initial encounter: Secondary | ICD-10-CM | POA: Diagnosis present

## 2022-10-27 DIAGNOSIS — Z6823 Body mass index (BMI) 23.0-23.9, adult: Secondary | ICD-10-CM

## 2022-10-27 DIAGNOSIS — E1169 Type 2 diabetes mellitus with other specified complication: Secondary | ICD-10-CM | POA: Diagnosis present

## 2022-10-27 DIAGNOSIS — R000 Tachycardia, unspecified: Secondary | ICD-10-CM | POA: Diagnosis present

## 2022-10-27 DIAGNOSIS — I69354 Hemiplegia and hemiparesis following cerebral infarction affecting left non-dominant side: Secondary | ICD-10-CM

## 2022-10-27 DIAGNOSIS — E785 Hyperlipidemia, unspecified: Secondary | ICD-10-CM | POA: Diagnosis present

## 2022-10-27 DIAGNOSIS — Y92000 Kitchen of unspecified non-institutional (private) residence as  the place of occurrence of the external cause: Secondary | ICD-10-CM

## 2022-10-27 DIAGNOSIS — E876 Hypokalemia: Secondary | ICD-10-CM | POA: Diagnosis present

## 2022-10-27 DIAGNOSIS — F0390 Unspecified dementia without behavioral disturbance: Secondary | ICD-10-CM | POA: Diagnosis present

## 2022-10-27 DIAGNOSIS — S72142A Displaced intertrochanteric fracture of left femur, initial encounter for closed fracture: Secondary | ICD-10-CM | POA: Diagnosis not present

## 2022-10-27 DIAGNOSIS — Z87891 Personal history of nicotine dependence: Secondary | ICD-10-CM

## 2022-10-27 DIAGNOSIS — Z7902 Long term (current) use of antithrombotics/antiplatelets: Secondary | ICD-10-CM

## 2022-10-27 DIAGNOSIS — Z880 Allergy status to penicillin: Secondary | ICD-10-CM

## 2022-10-27 DIAGNOSIS — Z823 Family history of stroke: Secondary | ICD-10-CM

## 2022-10-27 DIAGNOSIS — Z7982 Long term (current) use of aspirin: Secondary | ICD-10-CM

## 2022-10-27 DIAGNOSIS — Z7901 Long term (current) use of anticoagulants: Secondary | ICD-10-CM

## 2022-10-27 DIAGNOSIS — Z66 Do not resuscitate: Secondary | ICD-10-CM | POA: Diagnosis present

## 2022-10-27 DIAGNOSIS — E440 Moderate protein-calorie malnutrition: Secondary | ICD-10-CM | POA: Diagnosis not present

## 2022-10-27 DIAGNOSIS — E1165 Type 2 diabetes mellitus with hyperglycemia: Secondary | ICD-10-CM | POA: Diagnosis present

## 2022-10-27 DIAGNOSIS — I482 Chronic atrial fibrillation, unspecified: Secondary | ICD-10-CM | POA: Diagnosis present

## 2022-10-27 DIAGNOSIS — Z8673 Personal history of transient ischemic attack (TIA), and cerebral infarction without residual deficits: Secondary | ICD-10-CM

## 2022-10-27 DIAGNOSIS — L89159 Pressure ulcer of sacral region, unspecified stage: Secondary | ICD-10-CM | POA: Diagnosis present

## 2022-10-27 DIAGNOSIS — Z90710 Acquired absence of both cervix and uterus: Secondary | ICD-10-CM

## 2022-10-27 DIAGNOSIS — I10 Essential (primary) hypertension: Secondary | ICD-10-CM | POA: Diagnosis present

## 2022-10-27 DIAGNOSIS — S72002A Fracture of unspecified part of neck of left femur, initial encounter for closed fracture: Principal | ICD-10-CM

## 2022-10-27 DIAGNOSIS — D62 Acute posthemorrhagic anemia: Secondary | ICD-10-CM | POA: Diagnosis not present

## 2022-10-27 DIAGNOSIS — W19XXXA Unspecified fall, initial encounter: Secondary | ICD-10-CM

## 2022-10-27 DIAGNOSIS — Z8249 Family history of ischemic heart disease and other diseases of the circulatory system: Secondary | ICD-10-CM

## 2022-10-27 DIAGNOSIS — H9190 Unspecified hearing loss, unspecified ear: Secondary | ICD-10-CM | POA: Diagnosis present

## 2022-10-27 DIAGNOSIS — Z79899 Other long term (current) drug therapy: Secondary | ICD-10-CM

## 2022-10-27 DIAGNOSIS — E119 Type 2 diabetes mellitus without complications: Secondary | ICD-10-CM

## 2022-10-27 LAB — COMPREHENSIVE METABOLIC PANEL
ALT: 19 U/L (ref 0–44)
AST: 34 U/L (ref 15–41)
Albumin: 3 g/dL — ABNORMAL LOW (ref 3.5–5.0)
Alkaline Phosphatase: 57 U/L (ref 38–126)
Anion gap: 12 (ref 5–15)
BUN: 7 mg/dL — ABNORMAL LOW (ref 8–23)
CO2: 22 mmol/L (ref 22–32)
Calcium: 8.8 mg/dL — ABNORMAL LOW (ref 8.9–10.3)
Chloride: 106 mmol/L (ref 98–111)
Creatinine, Ser: 0.9 mg/dL (ref 0.44–1.00)
GFR, Estimated: 59 mL/min — ABNORMAL LOW (ref >60)
Glucose, Bld: 193 mg/dL — ABNORMAL HIGH (ref 70–99)
Potassium: 3.9 mmol/L (ref 3.5–5.1)
Sodium: 140 mmol/L (ref 135–145)
Total Bilirubin: 0.5 mg/dL (ref 0.3–1.2)
Total Protein: 5.9 g/dL — ABNORMAL LOW (ref 6.5–8.1)

## 2022-10-27 LAB — PROTIME-INR
INR: 1.7 — ABNORMAL HIGH (ref 0.8–1.2)
Prothrombin Time: 19.5 seconds — ABNORMAL HIGH (ref 11.4–15.2)

## 2022-10-27 LAB — CBC WITH DIFFERENTIAL/PLATELET
Abs Immature Granulocytes: 0.04 10*3/uL (ref 0.00–0.07)
Basophils Absolute: 0 10*3/uL (ref 0.0–0.1)
Basophils Relative: 0 %
Eosinophils Absolute: 0.1 10*3/uL (ref 0.0–0.5)
Eosinophils Relative: 1 %
HCT: 37.2 % (ref 36.0–46.0)
Hemoglobin: 12.2 g/dL (ref 12.0–15.0)
Immature Granulocytes: 0 %
Lymphocytes Relative: 17 %
Lymphs Abs: 1.6 10*3/uL (ref 0.7–4.0)
MCH: 30.5 pg (ref 26.0–34.0)
MCHC: 32.8 g/dL (ref 30.0–36.0)
MCV: 93 fL (ref 80.0–100.0)
Monocytes Absolute: 0.7 10*3/uL (ref 0.1–1.0)
Monocytes Relative: 7 %
Neutro Abs: 7.3 10*3/uL (ref 1.7–7.7)
Neutrophils Relative %: 75 %
Platelets: 263 10*3/uL (ref 150–400)
RBC: 4 MIL/uL (ref 3.87–5.11)
RDW: 12.8 % (ref 11.5–15.5)
WBC: 9.8 10*3/uL (ref 4.0–10.5)
nRBC: 0 % (ref 0.0–0.2)

## 2022-10-27 MED ORDER — SODIUM CHLORIDE 0.9 % IV BOLUS
500.0000 mL | Freq: Once | INTRAVENOUS | Status: AC
  Administered 2022-10-28: 500 mL via INTRAVENOUS

## 2022-10-27 NOTE — ED Notes (Signed)
Pt is very hard of hearing and can somewhat read lips.

## 2022-10-28 ENCOUNTER — Inpatient Hospital Stay (HOSPITAL_COMMUNITY): Payer: Medicare Other | Admitting: Anesthesiology

## 2022-10-28 ENCOUNTER — Emergency Department (HOSPITAL_COMMUNITY): Payer: Medicare Other

## 2022-10-28 DIAGNOSIS — Z90710 Acquired absence of both cervix and uterus: Secondary | ICD-10-CM | POA: Diagnosis not present

## 2022-10-28 DIAGNOSIS — L89159 Pressure ulcer of sacral region, unspecified stage: Secondary | ICD-10-CM | POA: Diagnosis present

## 2022-10-28 DIAGNOSIS — Z87891 Personal history of nicotine dependence: Secondary | ICD-10-CM | POA: Diagnosis not present

## 2022-10-28 DIAGNOSIS — E440 Moderate protein-calorie malnutrition: Secondary | ICD-10-CM | POA: Diagnosis not present

## 2022-10-28 DIAGNOSIS — E1169 Type 2 diabetes mellitus with other specified complication: Secondary | ICD-10-CM | POA: Diagnosis present

## 2022-10-28 DIAGNOSIS — Z7901 Long term (current) use of anticoagulants: Secondary | ICD-10-CM | POA: Diagnosis not present

## 2022-10-28 DIAGNOSIS — Z66 Do not resuscitate: Secondary | ICD-10-CM | POA: Diagnosis present

## 2022-10-28 DIAGNOSIS — Z8249 Family history of ischemic heart disease and other diseases of the circulatory system: Secondary | ICD-10-CM | POA: Diagnosis not present

## 2022-10-28 DIAGNOSIS — E785 Hyperlipidemia, unspecified: Secondary | ICD-10-CM | POA: Diagnosis present

## 2022-10-28 DIAGNOSIS — W19XXXA Unspecified fall, initial encounter: Secondary | ICD-10-CM | POA: Diagnosis not present

## 2022-10-28 DIAGNOSIS — Z8673 Personal history of transient ischemic attack (TIA), and cerebral infarction without residual deficits: Secondary | ICD-10-CM | POA: Diagnosis not present

## 2022-10-28 DIAGNOSIS — E1165 Type 2 diabetes mellitus with hyperglycemia: Secondary | ICD-10-CM | POA: Diagnosis present

## 2022-10-28 DIAGNOSIS — E119 Type 2 diabetes mellitus without complications: Secondary | ICD-10-CM | POA: Diagnosis not present

## 2022-10-28 DIAGNOSIS — S72142A Displaced intertrochanteric fracture of left femur, initial encounter for closed fracture: Secondary | ICD-10-CM

## 2022-10-28 DIAGNOSIS — I10 Essential (primary) hypertension: Secondary | ICD-10-CM | POA: Diagnosis present

## 2022-10-28 DIAGNOSIS — F0390 Unspecified dementia without behavioral disturbance: Secondary | ICD-10-CM | POA: Diagnosis present

## 2022-10-28 DIAGNOSIS — E876 Hypokalemia: Secondary | ICD-10-CM | POA: Diagnosis present

## 2022-10-28 DIAGNOSIS — I482 Chronic atrial fibrillation, unspecified: Secondary | ICD-10-CM | POA: Diagnosis present

## 2022-10-28 DIAGNOSIS — R000 Tachycardia, unspecified: Secondary | ICD-10-CM | POA: Diagnosis present

## 2022-10-28 DIAGNOSIS — W010XXA Fall on same level from slipping, tripping and stumbling without subsequent striking against object, initial encounter: Secondary | ICD-10-CM | POA: Diagnosis present

## 2022-10-28 DIAGNOSIS — Y92000 Kitchen of unspecified non-institutional (private) residence as  the place of occurrence of the external cause: Secondary | ICD-10-CM | POA: Diagnosis not present

## 2022-10-28 DIAGNOSIS — Z823 Family history of stroke: Secondary | ICD-10-CM | POA: Diagnosis not present

## 2022-10-28 DIAGNOSIS — Z7902 Long term (current) use of antithrombotics/antiplatelets: Secondary | ICD-10-CM | POA: Diagnosis not present

## 2022-10-28 DIAGNOSIS — Z79899 Other long term (current) drug therapy: Secondary | ICD-10-CM | POA: Diagnosis not present

## 2022-10-28 DIAGNOSIS — Z6823 Body mass index (BMI) 23.0-23.9, adult: Secondary | ICD-10-CM | POA: Diagnosis not present

## 2022-10-28 DIAGNOSIS — H9190 Unspecified hearing loss, unspecified ear: Secondary | ICD-10-CM | POA: Diagnosis present

## 2022-10-28 DIAGNOSIS — Z880 Allergy status to penicillin: Secondary | ICD-10-CM | POA: Diagnosis not present

## 2022-10-28 DIAGNOSIS — D62 Acute posthemorrhagic anemia: Secondary | ICD-10-CM | POA: Diagnosis not present

## 2022-10-28 DIAGNOSIS — I69354 Hemiplegia and hemiparesis following cerebral infarction affecting left non-dominant side: Secondary | ICD-10-CM | POA: Diagnosis not present

## 2022-10-28 LAB — BASIC METABOLIC PANEL
Anion gap: 11 (ref 5–15)
BUN: 7 mg/dL — ABNORMAL LOW (ref 8–23)
CO2: 21 mmol/L — ABNORMAL LOW (ref 22–32)
Calcium: 8.7 mg/dL — ABNORMAL LOW (ref 8.9–10.3)
Chloride: 108 mmol/L (ref 98–111)
Creatinine, Ser: 0.85 mg/dL (ref 0.44–1.00)
GFR, Estimated: >60 mL/min (ref >60)
Glucose, Bld: 166 mg/dL — ABNORMAL HIGH (ref 70–99)
Potassium: 4.3 mmol/L (ref 3.5–5.1)
Sodium: 140 mmol/L (ref 135–145)

## 2022-10-28 LAB — CBC
HCT: 35.7 % — ABNORMAL LOW (ref 36.0–46.0)
Hemoglobin: 11.7 g/dL — ABNORMAL LOW (ref 12.0–15.0)
MCH: 30.7 pg (ref 26.0–34.0)
MCHC: 32.8 g/dL (ref 30.0–36.0)
MCV: 93.7 fL (ref 80.0–100.0)
Platelets: 178 10*3/uL (ref 150–400)
RBC: 3.81 MIL/uL — ABNORMAL LOW (ref 3.87–5.11)
RDW: 12.8 % (ref 11.5–15.5)
WBC: 12.8 10*3/uL — ABNORMAL HIGH (ref 4.0–10.5)
nRBC: 0 % (ref 0.0–0.2)

## 2022-10-28 LAB — TYPE AND SCREEN
ABO/RH(D): A POS
Antibody Screen: NEGATIVE

## 2022-10-28 LAB — ABO/RH: ABO/RH(D): A POS

## 2022-10-28 LAB — HEMOGLOBIN A1C
Hgb A1c MFr Bld: 5.7 % — ABNORMAL HIGH (ref 4.8–5.6)
Mean Plasma Glucose: 116.89 mg/dL

## 2022-10-28 LAB — GLUCOSE, CAPILLARY
Glucose-Capillary: 146 mg/dL — ABNORMAL HIGH (ref 70–99)
Glucose-Capillary: 126 mg/dL — ABNORMAL HIGH (ref 70–99)
Glucose-Capillary: 157 mg/dL — ABNORMAL HIGH (ref 70–99)
Glucose-Capillary: 169 mg/dL — ABNORMAL HIGH (ref 70–99)

## 2022-10-28 LAB — SURGICAL PCR SCREEN
MRSA, PCR: NEGATIVE
Staphylococcus aureus: POSITIVE — AB

## 2022-10-28 MED ORDER — ENSURE ENLIVE PO LIQD
237.0000 mL | Freq: Two times a day (BID) | ORAL | Status: DC
  Administered 2022-10-28 – 2022-11-03 (×5): 237 mL via ORAL

## 2022-10-28 MED ORDER — SODIUM CHLORIDE 0.9 % IV SOLN: INTRAVENOUS | Status: AC

## 2022-10-28 MED ORDER — CHLORHEXIDINE GLUCONATE 4 % EX LIQD
60.0000 mL | Freq: Once | CUTANEOUS | Status: AC
  Administered 2022-10-29: 4 via TOPICAL
  Filled 2022-10-28: qty 60

## 2022-10-28 MED ORDER — MORPHINE SULFATE (PF) 2 MG/ML IV SOLN: 0.5000 mg | INTRAVENOUS | Status: DC | PRN

## 2022-10-28 MED ORDER — ADULT MULTIVITAMIN W/MINERALS CH
1.0000 | ORAL_TABLET | Freq: Every day | ORAL | Status: DC
  Administered 2022-10-29 – 2022-11-07 (×10): 1 via ORAL
  Filled 2022-10-28 (×10): qty 1

## 2022-10-28 MED ORDER — CLONIDINE HCL (ANALGESIA) 100 MCG/ML EP SOLN
EPIDURAL | Status: DC | PRN
  Administered 2022-10-28: 80 ug

## 2022-10-28 MED ORDER — ONDANSETRON HCL 4 MG/2ML IJ SOLN: 4.0000 mg | Freq: Four times a day (QID) | INTRAMUSCULAR | Status: DC | PRN

## 2022-10-28 MED ORDER — ROPIVACAINE HCL 5 MG/ML IJ SOLN: INTRAMUSCULAR | Status: DC | PRN

## 2022-10-28 MED ORDER — HYDROCODONE-ACETAMINOPHEN 5-325 MG PO TABS
1.0000 | ORAL_TABLET | Freq: Four times a day (QID) | ORAL | Status: DC | PRN
  Administered 2022-10-30: 1 via ORAL
  Filled 2022-10-28: qty 1

## 2022-10-28 MED ORDER — INSULIN ASPART 100 UNIT/ML IJ SOLN
0.0000 [IU] | Freq: Three times a day (TID) | INTRAMUSCULAR | Status: DC
  Administered 2022-10-28 – 2022-10-29 (×2): 1 [IU] via SUBCUTANEOUS

## 2022-10-28 MED ORDER — SENNOSIDES-DOCUSATE SODIUM 8.6-50 MG PO TABS
1.0000 | ORAL_TABLET | Freq: Every evening | ORAL | Status: DC | PRN
  Administered 2022-10-30: 1 via ORAL
  Filled 2022-10-28: qty 1

## 2022-10-28 MED ORDER — TRANEXAMIC ACID-NACL 1000-0.7 MG/100ML-% IV SOLN
1000.0000 mg | INTRAVENOUS | Status: AC
  Administered 2022-10-29: 1000 mg via INTRAVENOUS

## 2022-10-28 MED ORDER — CEFAZOLIN SODIUM-DEXTROSE 2-4 GM/100ML-% IV SOLN
2.0000 g | INTRAVENOUS | Status: AC
  Administered 2022-10-29: 2 g via INTRAVENOUS

## 2022-10-28 MED ORDER — DEXAMETHASONE SODIUM PHOSPHATE 4 MG/ML IJ SOLN
INTRAMUSCULAR | Status: DC | PRN
  Administered 2022-10-28: 5 mg via PERINEURAL

## 2022-10-28 MED ORDER — ROPIVACAINE HCL 5 MG/ML IJ SOLN
INTRAMUSCULAR | Status: DC | PRN
  Administered 2022-10-28: 30 mL via PERINEURAL

## 2022-10-28 MED ORDER — POVIDONE-IODINE 10 % EX SWAB
2.0000 | Freq: Once | CUTANEOUS | Status: AC
  Administered 2022-10-29: 2 via TOPICAL

## 2022-10-28 NOTE — Progress Notes (Signed)
Received call from ED at 1:44 a.m. to advise patient was being brought to unit.   Patient arrived on unit at 2:00 a.m.

## 2022-10-28 NOTE — Progress Notes (Signed)
PROGRESS NOTE    Christy Sutton  [id number] DOB: 07/11/1926 DOA: 10/27/2022 PCP: Donnajean Lopes, MD  Outpatient Specialists:     Brief Narrative:  Patient is a 89 year old female with a history of atrial fibrillation, diabetes mellitus, hyperlipidemia, hypertension and stroke.  Patient was admitted with left hip fracture earlier today following a fall.  Orthopedic team has been consulted.  Plan is to proceed with surgery on 10/29/2018 12/12/1922.  Eliquis and Plavix on hold.   Assessment & Plan:   Principal Problem:   Closed intertrochanteric fracture of left femur, initial encounter ([place]) Active Problems:   Diabetes mellitus type 2 in nonobese (HCC)   History of CVA (cerebrovascular accident)   Hyperlipidemia associated with type 2 diabetes mellitus ([place])   Atrial fibrillation, chronic ([place])   For surgery as documented above.   DVT prophylaxis: SCD. Code Status: DO NOT RESUSCITATE. Family Communication:  Disposition Plan: This will depend on hospital course.   Consultants:  Orthopedic surgery.  Procedures:  None for now.  Antimicrobials:  None.     Subjective: Patient is a poor historian.  Objective: Vitals:   10/28/22 0222 10/28/22 0640 10/28/22 0720 10/28/22 0759  BP: 122/83 (!) 110/92  (!) 153/138  Pulse: (!) 103 97  100  Resp: 14 15  19  $ Temp: 97.9 F (36.6 C) 98.7 F (37.1 C)  98 F (36.7 C)  TempSrc: Axillary Oral  Oral  SpO2: 95% 98% 98%   Weight:      Height:        Intake/Output Summary (Last 24 hours) at 10/28/2022 1358 Last data filed at 10/28/2022 0313 Gross per 24 hour  Intake 630 ml  Output --  Net 630 ml   Filed Weights   10/27/22 2201  Weight: 61.2 kg    Examination:  General exam: Appears calm and comfortable  Respiratory system: Clear to auscultation.  Cardiovascular system: S1 & S2 heard Gastrointestinal system: Abdomen is nondistended, soft and nontender.  Central nervous system: Awake and alert.     Data  Reviewed: I have personally reviewed following labs and imaging studies  CBC: Recent Labs  Lab 10/27/22 2200 10/28/22 0341  WBC 9.8 12.8*  NEUTROABS 7.3  --   HGB 12.2 11.7*  HCT 37.2 35.7*  MCV 93.0 93.7  PLT 263 0000000   Basic Metabolic Panel: Recent Labs  Lab 10/27/22 2200 10/28/22 0341  NA 140 140  K 3.9 4.3  CL 106 108  CO2 22 21*  GLUCOSE 193* 166*  BUN 7* 7*  CREATININE 0.90 0.85  CALCIUM 8.8* 8.7*   GFR: Estimated Creatinine Clearance: 32.8 mL/min (by C-G formula based on SCr of 0.85 mg/dL). Liver Function Tests: Recent Labs  Lab 10/27/22 2200  AST 34  ALT 19  ALKPHOS 57  BILITOT 0.5  PROT 5.9*  ALBUMIN 3.0*   No results for input(s): "LIPASE", "AMYLASE" in the last 168 hours. No results for input(s): "AMMONIA" in the last 168 hours. Coagulation Profile: Recent Labs  Lab 10/27/22 2200  INR 1.7*   Cardiac Enzymes: No results for input(s): "CKTOTAL", "CKMB", "CKMBINDEX", "TROPONINI" in the last 168 hours. BNP (last 3 results) No results for input(s): "PROBNP" in the last 8760 hours. HbA1C: Recent Labs    10/28/22 0341  HGBA1C 5.7*   CBG: Recent Labs  Lab 10/28/22 0803 10/28/22 1120  GLUCAP 146* 126*   Lipid Profile: No results for input(s): "CHOL", "HDL", "LDLCALC", "TRIG", "CHOLHDL", "LDLDIRECT" in the last 72 hours. Thyroid Function Tests: No results  for input(s): "TSH", "T4TOTAL", "FREET4", "T3FREE", "THYROIDAB" in the last 72 hours. Anemia Panel: No results for input(s): "VITAMINB12", "FOLATE", "FERRITIN", "TIBC", "IRON", "RETICCTPCT" in the last 72 hours. Urine analysis:    Component Value Date/Time   COLORURINE YELLOW 03/14/2021 1528   APPEARANCEUR CLEAR 03/14/2021 1528   LABSPEC 1.008 03/14/2021 1528   PHURINE 5.0 03/14/2021 1528   GLUCOSEU NEGATIVE 03/14/2021 1528   HGBUR NEGATIVE 03/14/2021 1528   BILIRUBINUR NEGATIVE 03/14/2021 1528   KETONESUR 5 (A) 03/14/2021 1528   PROTEINUR NEGATIVE 03/14/2021 1528   NITRITE NEGATIVE  03/14/2021 1528   LEUKOCYTESUR NEGATIVE 03/14/2021 1528   Sepsis Labs: @LABRCNTIP$ (procalcitonin:4,lacticidven:4)  )No results found for this or any previous visit (from the past 240 hour(s)).       Radiology Studies: DG FEMUR PORT MIN 2 VIEWS LEFT  Result Date: 10/28/2022 CLINICAL DATA:  Left intratrochanteric femoral fracture EXAM: LEFT FEMUR PORTABLE 2 VIEWS COMPARISON:  Films from the previous day. FINDINGS: Proximal intratrochanteric femoral fracture is again seen with some impaction and angulation at the fracture site. The more distal femur appears within normal limits. Mild degenerative changes about the knee joint are seen. No soft tissue abnormality is noted. IMPRESSION: Stable left intratrochanteric fracture. Electronically Signed   By: Inez Catalina M.D.   On: 10/28/2022 01:01   CT Cervical Spine Wo Contrast  Result Date: 10/27/2022 CLINICAL DATA:  [place], trauma EXAM: CT CERVICAL SPINE WITHOUT CONTRAST TECHNIQUE: Multidetector CT imaging of the cervical spine was performed without intravenous contrast. Multiplanar CT image reconstructions were also generated. RADIATION DOSE REDUCTION: This exam was performed according to the departmental dose-optimization program which includes automated exposure control, adjustment of the mA and/or kV according to patient size and/or use of iterative reconstruction technique. COMPARISON:  08/04/2021 FINDINGS: Alignment: Alignment is anatomic. Skull base and vertebrae: No acute fracture. No primary bone lesion or focal pathologic process. Soft tissues and spinal canal: No prevertebral fluid or swelling. No visible canal hematoma. Disc levels: Multilevel cervical spondylosis from C3-4 through C6-7. Right-sided neural foraminal narrowing at C4-5 and C5-6, with left-sided neural foraminal encroachment at C6-7. Upper chest: Airway is patent. Stable biapical pleural and parenchymal scarring. Left apical pulmonary nodule measures 7 x 8 mm reference image 79/5,  unchanged since 03/14/2021. Other: Reconstructed images demonstrate no additional findings. IMPRESSION: 1. No acute cervical spine fracture. 2. Stable multilevel cervical spondylosis. 3. Stable left apical pulmonary nodule, likely benign given long-term stability. Electronically Signed   By: Randa Ngo M.D.   On: 10/27/2022 23:59   CT Head Wo Contrast  Result Date: 10/27/2022 CLINICAL DATA:  [place], trauma EXAM: CT HEAD WITHOUT CONTRAST TECHNIQUE: Contiguous axial images were obtained from the base of the skull through the vertex without intravenous contrast. RADIATION DOSE REDUCTION: This exam was performed according to the departmental dose-optimization program which includes automated exposure control, adjustment of the mA and/or kV according to patient size and/or use of iterative reconstruction technique. COMPARISON:  08/04/2021 FINDINGS: Brain: No acute infarct or hemorrhage. Lateral ventricles and midline structures are stable. No acute extra-axial fluid collections. No mass effect. Stable diffuse cortical atrophy. Vascular: No hyperdense vessel or unexpected calcification. Skull: Normal. Negative for fracture or focal lesion. Sinuses/Orbits: Chronic opacification of the sphenoid sinus. Remaining paranasal sinuses are clear. Other: None. IMPRESSION: 1. No acute intracranial process. Electronically Signed   By: Randa Ngo M.D.   On: 10/27/2022 23:55   DG Pelvis Portable  Result Date: 10/27/2022 CLINICAL DATA:  [place] EXAM: PORTABLE PELVIS 1-2 VIEWS COMPARISON:  None Available. FINDINGS: Single frontal view of the pelvis excludes portions of the right hip by collimation. There is a mildly displaced intertrochanteric left hip fracture. No significant impaction or angulation. No dislocation. Remainder of the bony pelvis is unremarkable. IMPRESSION: 1. Minimally displaced intertrochanteric left hip fracture. Electronically Signed   By: Randa Ngo M.D.   On: 10/27/2022 22:11   DG Chest Portable 1  View  Result Date: 10/27/2022 CLINICAL DATA:  [place], anticoagulated EXAM: PORTABLE CHEST 1 VIEW COMPARISON:  03/14/2021 FINDINGS: Single frontal view of the chest demonstrates an unremarkable cardiac silhouette. No airspace disease, effusion, or pneumothorax. No acute bony abnormalities. IMPRESSION: 1. No acute intrathoracic process. Electronically Signed   By: Randa Ngo M.D.   On: 10/27/2022 22:11   DG Wrist Complete Left  Result Date: 10/27/2022 CLINICAL DATA:  Fall, level 2 trauma. EXAM: LEFT WRIST - COMPLETE 3+ VIEW COMPARISON:  None Available. FINDINGS: There is no evidence of fracture or dislocation. Joint space narrowing and subchondral sclerosis at the first carpometacarpal joint. Osteopenia. Soft tissues are unremarkable. IMPRESSION: 1. No acute fracture or dislocation. 2. Severe degenerative changes at the first carpometacarpal joint. Electronically Signed   By: Keane Police D.O.   On: 10/27/2022 22:10        Scheduled Meds:  feeding supplement  237 mL Oral BID BM   insulin aspart  0-6 Units Subcutaneous TID WC   multivitamin with minerals  1 tablet Oral Daily   Continuous Infusions:   LOS: 0 days    Time spent: 35 minutes.    Dana Allan, MD  [facility] Pager #: [phone] 7PM-7AM contact night coverage as above

## 2022-10-28 NOTE — Assessment & Plan Note (Signed)
With residual left-sided weakness.  Holding Eliquis and Plavix.  Continue simvastatin.

## 2022-10-28 NOTE — Anesthesia Preprocedure Evaluation (Addendum)
Anesthesia Evaluation  Patient identified by MRN, date of birth, ID band Patient awake    Reviewed: Allergy & Precautions, Patient's Chart, lab work & pertinent test results  Airway Mallampati: II  TM Distance: >3 FB Neck ROM: Full    Dental  (+) Edentulous Upper, Edentulous Lower   Pulmonary neg pulmonary ROS   Pulmonary exam normal breath sounds clear to auscultation       Cardiovascular hypertension, Normal cardiovascular exam+ dysrhythmias Atrial Fibrillation + Valvular Problems/Murmurs MR  Rhythm:Regular Rate:Normal  Echo 2018 - Left ventricle: The cavity size was normal. There was mild concentric hypertrophy. Systolic function was normal. The estimated ejection fraction was in the range of 60% to 65%. Wall motion was normal; there were no regional wall motion abnormalities. The study was not technically sufficient to allow evaluation of LV diastolic dysfunction due to atrial fibrillation.  - Aortic valve: Mildly calcified annulus. Trileaflet. There was trivial regurgitation.  - Mitral valve: Mildly calcified annulus. Normal thickness leaflets. There was mild to moderate regurgitation.  - Left atrium: The atrium was mildly to moderately dilated.  - Right atrium: The atrium was mildly dilated.  - Tricuspid valve: There was mild regurgitation.     Neuro/Psych TIACVA    GI/Hepatic negative GI ROS, Neg liver ROS,,,  Endo/Other  diabetes    Renal/GU negative Renal ROS     Musculoskeletal negative musculoskeletal ROS (+)    Abdominal   Peds  Hematology  (+) Blood dyscrasia, anemia   Anesthesia Other Findings   Reproductive/Obstetrics                             Anesthesia Physical Anesthesia Plan  ASA: 3  Anesthesia Plan: Regional   Post-op Pain Management:    Induction: Intravenous  PONV Risk Score and Plan:   Airway Management Planned:   Additional Equipment:   Intra-op Plan:    Post-operative Plan:   Informed Consent: I have reviewed the patients History and Physical, chart, labs and discussed the procedure including the risks, benefits and alternatives for the proposed anesthesia with the patient or authorized representative who has indicated his/her understanding and acceptance.       Plan Discussed with:   Anesthesia Plan Comments:        Anesthesia Quick Evaluation

## 2022-10-28 NOTE — Assessment & Plan Note (Signed)
Continue simvastatin.

## 2022-10-28 NOTE — ED Notes (Signed)
ED TO INPATIENT HANDOFF REPORT  ED Nurse Name and Phone #: [phone] [facility]  S Name/Age/Gender Christy Sutton 89 y.o. female Room/Bed: TRABC/TRABC  Code Status   Code Status: Prior  Home/SNF/Other Home or possible rehab Patient oriented to: self and situation Is this baseline? Yes   Triage Complete: Triage complete  Chief Complaint Closed intertrochanteric fracture of left femur, initial encounter [facility]) [S72.142A]  Triage Note No notes on file   Allergies Allergies  Allergen Reactions   Penicillins Hives    Has patient had a PCN reaction causing immediate rash, facial/tongue/throat swelling, SOB or lightheadedness with hypotension: Yes Has patient had a PCN reaction causing severe rash involving mucus membranes or skin necrosis: Unknown Has patient had a PCN reaction that required hospitalization: Unknown Has patient had a PCN reaction occurring within the last 10 years: No If all of the above answers are "NO", then may proceed with Cephalosporin use.     Level of Care/Admitting Diagnosis ED Disposition     ED Disposition  Admit   Condition  --   [place]: [place] [[id number]]  Level of Care: Telemetry Medical [104]  May admit patient to [facility] or [facility] if equivalent level of care is available:: No  Covid Evaluation: Asymptomatic - no recent exposure (last 10 days) testing not required  Diagnosis: [facility]trochanteric fracture of left femur[id number]  Admitting Physician: Lenore Cordia [id number]  Attending Physician: Lenore Cordia [id number]  Certification:: I certify this patient will need inpatient services for at least 2 midnights  Estimated Length of Stay: 3          B Medical/Surgery History Past Medical History:  Diagnosis Date   Atrial fibrillation ([place])    Diabetes mellitus without complication ([place])    Hyperlipidemia    Hypertension    Stroke ([place])    x 3   TIA (transient  ischemic attack)    Past Surgical History:  Procedure Laterality Date   ABDOMINAL HYSTERECTOMY     due to tubal pregnancies   TEE WITHOUT CARDIOVERSION N/A 01/30/2017   Procedure: TRANSESOPHAGEAL ECHOCARDIOGRAM (TEE);  Surgeon: Adrian Prows, MD;  Location[facility] ENDOSCOPY;  Service: Cardiovascular;  Laterality: N/A;   TUBAL LIGATION       A IV Location/Drains/Wounds Patient Lines/Drains/Airways Status     Active Line/Drains/Airways     Name Placement date Placement time Site Days   Peripheral IV 10/27/22 22 G Distal;Posterior;Right Forearm 10/27/22  2138  Forearm  1            Intake/Output Last 24 hours No intake or output data in the 24 hours ending 10/28/22 0046  Labs/Imaging Results for orders placed or performed during the hospital encounter of 10/27/22 (from the past 48 hour(s))  Comprehensive metabolic panel     Status: Abnormal   Collection Time: 10/27/22 10:00 PM  Result Value Ref Range   Sodium 140 135 - 145 mmol/L   Potassium 3.9 3.5 - 5.1 mmol/L   Chloride 106 98 - 111 mmol/L   CO2 22 22 - 32 mmol/L   Glucose, Bld 193 (H) 70 - 99 mg/dL    Comment: Glucose reference range applies only to samples taken after fasting for at least 8 hours.   BUN 7 (L) 8 - 23 mg/dL   Creatinine, Ser 0.90 0.44 - 1.00 mg/dL   Calcium 8.8 (L) 8.9 - 10.3 mg/dL   Total Protein 5.9 (L) 6.5 - 8.1 g/dL  Albumin 3.0 (L) 3.5 - 5.0 g/dL   AST 34 15 - 41 U/L   ALT 19 0 - 44 U/L   Alkaline Phosphatase 57 38 - 126 U/L   Total Bilirubin 0.5 0.3 - 1.2 mg/dL   GFR, Estimated 59 (L) >60 mL/min    Comment: (NOTE) Calculated using the CKD-EPI Creatinine Equation (2021)    Anion gap 12 5 - 15    Comment: Performed at [facility] [address], [place], [place] [address]  CBC with Differential     Status: None   Collection Time: 10/27/22 10:00 PM  Result Value Ref Range   WBC 9.8 4.0 - 10.5 K/uL   RBC 4.00 3.87 - 5.11 MIL/uL   Hemoglobin 12.2 12.0 - 15.0 g/dL   HCT 37.2 36.0 - 46.0 %    MCV 93.0 80.0 - 100.0 fL   MCH 30.5 26.0 - 34.0 pg   MCHC 32.8 30.0 - 36.0 g/dL   RDW 12.8 11.5 - 15.5 %   Platelets 263 150 - 400 K/uL   nRBC 0.0 0.0 - 0.2 %   Neutrophils Relative % 75 %   Neutro Abs 7.3 1.7 - 7.7 K/uL   Lymphocytes Relative 17 %   Lymphs Abs 1.6 0.7 - 4.0 K/uL   Monocytes Relative 7 %   Monocytes Absolute 0.7 0.1 - 1.0 K/uL   Eosinophils Relative 1 %   Eosinophils Absolute 0.1 0.0 - 0.5 K/uL   Basophils Relative 0 %   Basophils Absolute 0.0 0.0 - 0.1 K/uL   Immature Granulocytes 0 %   Abs Immature Granulocytes 0.04 0.00 - 0.07 K/uL    Comment: Performed at [facility], [address] [place], [place] [address]  Protime-INR     Status: Abnormal   Collection Time: 10/27/22 10:00 PM  Result Value Ref Range   Prothrombin Time 19.5 (H) 11.4 - 15.2 seconds   INR 1.7 (H) 0.8 - 1.2    Comment: (NOTE) INR goal varies based on device and disease states. Performed at [facility], [place] [address] [place], [place] [address]    CT Cervical Spine Wo Contrast  Result Date: 10/27/2022 CLINICAL DATA:  [place], trauma EXAM: CT CERVICAL SPINE WITHOUT CONTRAST TECHNIQUE: Multidetector CT imaging of the cervical spine was performed without intravenous contrast. Multiplanar CT image reconstructions were also generated. RADIATION DOSE REDUCTION: This exam was performed according to the departmental dose-optimization program which includes automated exposure control, adjustment of the mA and/or kV according to patient size and/or use of iterative reconstruction technique. COMPARISON:  08/04/2021 FINDINGS: Alignment: Alignment is anatomic. Skull base and vertebrae: No acute fracture. No primary bone lesion or focal pathologic process. Soft tissues and spinal canal: No prevertebral fluid or swelling. No visible canal hematoma. Disc levels: Multilevel cervical spondylosis from C3-4 through C6-7. Right-sided neural foraminal narrowing at C4-5 and C5-6, with left-sided neural  foraminal encroachment at C6-7. Upper chest: Airway is patent. Stable biapical pleural and parenchymal scarring. Left apical pulmonary nodule measures 7 x 8 mm reference image 79/5, unchanged since 03/14/2021. Other: Reconstructed images demonstrate no additional findings. IMPRESSION: 1. No acute cervical spine fracture. 2. Stable multilevel cervical spondylosis. 3. Stable left apical pulmonary nodule, likely benign given long-term stability. Electronically Signed   By: Randa Ngo M.D.   On: 10/27/2022 23:59   CT Head Wo Contrast  Result Date: 10/27/2022 CLINICAL DATA:  [place], trauma EXAM: CT HEAD WITHOUT CONTRAST TECHNIQUE: Contiguous axial images were obtained from  the base of the skull through the vertex without intravenous contrast. RADIATION DOSE REDUCTION: This exam was performed according to the departmental dose-optimization program which includes automated exposure control, adjustment of the mA and/or kV according to patient size and/or use of iterative reconstruction technique. COMPARISON:  08/04/2021 FINDINGS: Brain: No acute infarct or hemorrhage. Lateral ventricles and midline structures are stable. No acute extra-axial fluid collections. No mass effect. Stable diffuse cortical atrophy. Vascular: No hyperdense vessel or unexpected calcification. Skull: Normal. Negative for fracture or focal lesion. Sinuses/Orbits: Chronic opacification of the sphenoid sinus. Remaining paranasal sinuses are clear. Other: None. IMPRESSION: 1. No acute intracranial process. Electronically Signed   By: Randa Ngo M.D.   On: 10/27/2022 23:55   DG Pelvis Portable  Result Date: 10/27/2022 CLINICAL DATA:  [place] EXAM: PORTABLE PELVIS 1-2 VIEWS COMPARISON:  None Available. FINDINGS: Single frontal view of the pelvis excludes portions of the right hip by collimation. There is a mildly displaced intertrochanteric left hip fracture. No significant impaction or angulation. No dislocation. Remainder of the bony pelvis is  unremarkable. IMPRESSION: 1. Minimally displaced intertrochanteric left hip fracture. Electronically Signed   By: Randa Ngo M.D.   On: 10/27/2022 22:11   DG Chest Portable 1 View  Result Date: 10/27/2022 CLINICAL DATA:  [place], anticoagulated EXAM: PORTABLE CHEST 1 VIEW COMPARISON:  03/14/2021 FINDINGS: Single frontal view of the chest demonstrates an unremarkable cardiac silhouette. No airspace disease, effusion, or pneumothorax. No acute bony abnormalities. IMPRESSION: 1. No acute intrathoracic process. Electronically Signed   By: Randa Ngo M.D.   On: 10/27/2022 22:11   DG Wrist Complete Left  Result Date: 10/27/2022 CLINICAL DATA:  Fall, level 2 trauma. EXAM: LEFT WRIST - COMPLETE 3+ VIEW COMPARISON:  None Available. FINDINGS: There is no evidence of fracture or dislocation. Joint space narrowing and subchondral sclerosis at the first carpometacarpal joint. Osteopenia. Soft tissues are unremarkable. IMPRESSION: 1. No acute fracture or dislocation. 2. Severe degenerative changes at the first carpometacarpal joint. Electronically Signed   By: Keane Police D.O.   On: 10/27/2022 22:10    Pending Labs Unresulted Labs (From admission, onward)    None       Vitals/Pain Today's Vitals   10/27/22 2201 10/27/22 2206 10/27/22 2300 10/28/22 0035  BP:   114/65 111/76  Pulse:   83 67  Resp:   14 19  Temp:  97.7 F (36.5 C)    TempSrc:  Rectal    SpO2:   99% 100%  Weight: 61.2 kg     Height: 5' 3"$  (1.6 m)     PainSc:        Isolation Precautions No active isolations  Medications Medications  sodium chloride 0.9 % bolus 500 mL (500 mLs Intravenous New Bag/Given 10/28/22 0036)    Mobility Pt has a fx hip, will need assistance        R Recommendations: See Admitting Provider Note  Report given to:   Additional Notes: pt is on Plavix and Eliquis, has not c/o of pain unless you move her LLE. Daughter at bedside. Pt is VERY hard of hearing, pt reads lips

## 2022-10-28 NOTE — Progress Notes (Signed)
Pt dw EDP.  Will need IMN for left hip fracture.  Due to chronic anticoagulation, will need to let Pradaxa wash out.  Plan for surgery possibly Sunday v Monday.  Full consult upcoming this am.

## 2022-10-28 NOTE — Hospital Course (Signed)
Christy Sutton is a 89 y.o. female with medical history significant for chronic atrial fibrillation on Pradaxa, history of CVA, T2DM, HTN, HLD, hard of hearing who is admitted with acute left intertrochanteric hip fracture.

## 2022-10-28 NOTE — ED Notes (Signed)
Called unit for 5N to advised that pt is on her way to unit, has be greater than 40 mins since ready bed assigned. Handoff report is in pt's chart.

## 2022-10-28 NOTE — Assessment & Plan Note (Signed)
Prior history of T2DM with mild hyperglycemia on admit.  Not on meds at home. -Check A1c, placed on very sensitive SSI

## 2022-10-28 NOTE — Plan of Care (Signed)
  Problem: Skin Integrity: Goal: Risk for impaired skin integrity will decrease Outcome: Progressing   Problem: Nutrition: Goal: Adequate nutrition will be maintained Outcome: Progressing   Problem: Pain Managment: Goal: General experience of comfort will improve Outcome: Progressing   Problem: Skin Integrity: Goal: Risk for impaired skin integrity will decrease Outcome: Progressing

## 2022-10-28 NOTE — Anesthesia Procedure Notes (Signed)
Anesthesia Regional Block: Femoral nerve block   Pre-Anesthetic Checklist: , timeout performed,  Correct Patient, Correct Site, Correct Laterality,  Correct Procedure, Correct Position, site marked,  Risks and benefits discussed,  Surgical consent,  Pre-op evaluation,  At surgeon's request and post-op pain management  Laterality: Lower and Left  Prep: chloraprep       Needles:  Injection technique: Single-shot  Needle Type: Stimulator Needle - 80     Needle Length: 9cm  Needle Gauge: 22   Needle insertion depth: 6 cm   Additional Needles:   Procedures:,,,, ultrasound used (permanent image in chart),,    Narrative:  Start time: 10/28/2022 12:00 PM End time: 10/28/2022 12:26 PM Injection made incrementally with aspirations every 5 mL.  Performed by: Personally  Anesthesiologist: Nolon Nations, MD  Additional Notes: BP cuff, EKG monitors applied. Sedation begun. Femoral artery palpated for location of nerve. After nerve location verified with U/S, anesthetic injected incrementally, slowly, and after negative aspirations under direct u/s guidance. Good perineural spread. Patient tolerated well.

## 2022-10-28 NOTE — Progress Notes (Signed)
Initial Nutrition Assessment  DOCUMENTATION CODES:   Not applicable  INTERVENTION:   Multivitamin w/ minerals daily Ensure Enlive po BID, each supplement provides 350 kcal and 20 grams of protein. Feeding assist with all meals   NUTRITION DIAGNOSIS:   Increased nutrient needs related to hip fracture as evidenced by estimated needs.  GOAL:   Patient will meet greater than or equal to 90% of their needs  MONITOR:   PO intake, Supplement acceptance  REASON FOR ASSESSMENT:   Consult Hip fracture protocol  ASSESSMENT:   89 y.o. female presented to the ED with hip pain after a fall. PMH includes T2DM, HTN, HLD, CVA with residual L-sided weakness, hard of hearing, and A. Fib. Pt admitted with L closed intertrochanteric femur fracture.   RD working remotely at time of assessment. Per chart, pt uses a walker at baseline to ambulate. Plan for surgery tomorrow.  Discussed with RN, pt with very hard of hearing. Per RN, pt refused breakfast from hospital and son brought in breakfast sandwich and pt refused that as well. RN agreed that pt would benefit from oral nutrition supplements and feeding assist. RD to order.  No weights within the past year to assess weight loss.   Medications reviewed and include: NovoLog SSI Labs reviewed.   NUTRITION - FOCUSED PHYSICAL EXAM:  Deferred to follow-up.   Diet Order:   Diet Order             Diet NPO time specified  Diet effective ____           Diet regular Room service appropriate? Yes; Fluid consistency: Thin  Diet effective now                   EDUCATION NEEDS:   No education needs have been identified at this time  Skin:  Skin Assessment: Reviewed RN Assessment (PI on Sacrum)  Last BM:  Unknown  Height:   Ht Readings from Last 1 Encounters:  10/27/22 5' 3"$  (1.6 m)    Weight:   Wt Readings from Last 1 Encounters:  10/27/22 61.2 kg   Ideal Body Weight:  52.3 kg  BMI:  Body mass index is 23.91  kg/m.  Estimated Nutritional Needs:  Kcal:  1600-1800 Protein:  80-100 grams Fluid:  >/= 1.6 L   Hermina Barters RD, LDN Clinical Dietitian See [facility] for contact information.

## 2022-10-28 NOTE — Assessment & Plan Note (Signed)
Occurring after fall at home.  No reported prodrome prior to fall.  Patient has a 6.0% 30-day risk of perioperative death, MI, or cardiac arrest given history of CVA. -Orthopedics to consult; plan for surgery 2/18 versus 2/19 -Hold Eliquis and Plavix -Continue analgesics as needed

## 2022-10-28 NOTE — H&P (Signed)
History and Physical    Christy Sutton [id number] DOB: [birth year] DOA: 10/27/2022  PCP: Donnajean Lopes, MD  Patient coming from: Home  I have personally briefly reviewed patient's old medical records in [place]  Chief Complaint: Left hip pain after fall  HPI: Christy Sutton is a 89 y.o. female with medical history significant for chronic atrial fibrillation on Eliquis, history of CVA with residual left-sided weakness, T2DM, HTN, HLD, diarrhea hard of hearing who presented to the ED for evaluation of left hip pain after fall.  History is supplemented by patient's daughter-in-law.  Patient lives at home with her grandson.  She normally ambulates with the use of a walker.  She has weakness on her left side due to prior strokes.  She was in the kitchen opening the fridge when she lost her balance and fell onto her left side.  She developed significant pain in her left hip and was unable to bear weight.  This was witnessed by family.  Patient does take Eliquis 2.5 mg BID, last taken around 5:30 PM on 2/16.  Also on Plavix 75 mg daily, last dose 2/16 AM.  ED Course  Labs/Imaging on admission: I have personally reviewed following labs and imaging studies.  Initial vitals showed BP 132/70, pulse 103, RR 14, temperature 97.7 F rectally, SpO2 95% on room air.  Labs show sodium 140, potassium 3.9, bicarb 22, BUN 7, creatinine 0.90, serum glucose 193, LFTs within normal limits, WBC 9.8, hemoglobin 12.2, platelets 263,000, INR 1.7.  Pelvic x-ray showed a minimally displaced intertrochanteric left hip fracture.  Portable chest x-ray negative for focal consolidation, edema, effusion.  CT head without contrast negative for acute intracranial process.  CT cervical spine without contrast negative for acute spine fracture.  Stable multilevel spondylosis noted.  Left wrist x-ray negative for acute fracture or dislocation.  Patient was given 500 cc normal saline.  EDP consulted on-call  orthopedics, Dr. Victorino December who recommended medical admission with plan for potential surgery on Sunday versus Monday.  The hospitalist service was consulted to admit for further evaluation and management.  Review of Systems: All systems reviewed and are negative except as documented in history of present illness above.   Past Medical History:  Diagnosis Date   Atrial fibrillation ([place])    Diabetes mellitus without complication ([place])    Hyperlipidemia    Hypertension    Stroke ([place])    x 3   TIA (transient ischemic attack)     Past Surgical History:  Procedure Laterality Date   ABDOMINAL HYSTERECTOMY     due to tubal pregnancies   TEE WITHOUT CARDIOVERSION N/A 01/30/2017   Procedure: TRANSESOPHAGEAL ECHOCARDIOGRAM (TEE);  Surgeon: Adrian Prows, MD;  Location: [facility];  Service: Cardiovascular;  Laterality: N/A;   TUBAL LIGATION      Social History:  reports that she has quit smoking. Her smoking use included cigarettes. She has never used smokeless tobacco. She reports that she does not drink alcohol and does not use drugs.  Allergies  Allergen Reactions   Penicillins Hives    Has patient had a PCN reaction causing immediate rash, facial/tongue/throat swelling, SOB or lightheadedness with hypotension: Yes Has patient had a PCN reaction causing severe rash involving mucus membranes or skin necrosis: Unknown Has patient had a PCN reaction that required hospitalization: Unknown Has patient had a PCN reaction occurring within the last 10 years: No If all of the above answers are "NO", then may proceed with Cephalosporin use.  Family History  Problem Relation Age of Onset   Heart attack Father    Stroke Father    Stroke Mother    Stroke Sister    Stroke Sister      Prior to Admission medications   Medication Sig Start Date End Date Taking? Authorizing Provider  aspirin 81 MG chewable tablet Chew 81 mg by mouth every evening.     [provider]  atenolol  (TENORMIN) 25 MG tablet Take 1 tablet (25 mg total) by mouth daily. 03/30/17   Love, Ivan Anchors, PA-C  atorvastatin (LIPITOR) 20 MG tablet Take 1 tablet (20 mg total) by mouth daily at 6 PM. 03/30/17   Love, Ivan Anchors, PA-C  clopidogrel (PLAVIX) 75 MG tablet Take 75 mg by mouth daily. 04/15/21   [provider]  dabigatran (PRADAXA) 150 MG CAPS capsule Take 1 capsule (150 mg total) by mouth every 12 (twelve) hours. 03/30/17   Love, Ivan Anchors, PA-C  furosemide (LASIX) 20 MG tablet Take 20 mg by mouth 2 (two) times daily as needed for edema. 03/27/21   [provider]  KLOR-CON M20 20 MEQ tablet Take 20 mEq by mouth 3 (three) times a week. 03/28/21   [provider]  meclizine (ANTIVERT) 25 MG tablet Take 25 mg by mouth daily as needed for dizziness.    [provider]  Multiple Vitamin (MULTIVITAMIN) tablet Take 1 tablet by mouth daily.    [provider]  simvastatin (ZOCOR) 40 MG tablet Take 40 mg by mouth daily. 04/15/21   [provider]    Physical Exam: Vitals:   10/27/22 2201 10/27/22 2206 10/27/22 2300 10/28/22 0035  BP:   114/65 111/76  Pulse:   83 67  Resp:   14 19  Temp:  97.7 F (36.5 C)    TempSrc:  Rectal    SpO2:   99% 100%  Weight: 61.2 kg     Height: 5' 3"$  (1.6 m)      Constitutional: Elderly woman resting supine in bed.  NAD, calm, comfortable Eyes: EOMI, lids and conjunctivae normal ENMT: Mucous membranes are moist. Posterior pharynx clear of any exudate or lesions.Normal dentition.  Very hard of hearing Neck: normal, supple, no masses. Respiratory: clear to auscultation bilaterally, no wheezing, no crackles. Normal respiratory effort. No accessory muscle use.  Cardiovascular: Ureh yellowly irregular, no murmurs / rubs / gallops. No extremity edema. 2+ pedal pulses. Abdomen: no tenderness, no masses palpated. Musculoskeletal: no clubbing / cyanosis. No joint deformity upper and lower extremities.  ROM LLE limited due to hip  fracture. Skin: no rashes, lesions, ulcers. No induration Neurologic: Sensation intact. Strength 5/5 both upper extremities, limited LLE due to hip fracture. Psychiatric: Difficult to assess due to significant hearing loss but awake, alert, and appears fully oriented.  EKG: Personally reviewed. Atrial fibrillation, rate 107.  Rate is faster when compared to previous from 2022.  Assessment/Plan Principal Problem:   Closed intertrochanteric fracture of left femur, initial encounter ([place]) Active Problems:   Atrial fibrillation, chronic (HCC)   History of CVA (cerebrovascular accident)   Diabetes mellitus type 2 in nonobese ([place])   Hyperlipidemia associated with type 2 diabetes mellitus ([place])   Christy Sutton is a 89 y.o. female with medical history significant for chronic atrial fibrillation on Pradaxa, history of CVA, T2DM, HTN, HLD, hard of hearing who is admitted with acute left intertrochanteric hip fracture.  Assessment and Plan: * Closed intertrochanteric fracture of left femur, initial encounter ([place]) Occurring after  fall at home.  No reported prodrome prior to fall.  Patient has a 6.0% 30-day risk of perioperative death, MI, or cardiac arrest given history of CVA. -Orthopedics to consult; plan for surgery 2/18 versus 2/19 -Hold Eliquis and Plavix -Continue analgesics as needed  Atrial fibrillation, chronic (HCC) Patient with chronic A-fib with controlled rate.  Not on rate/rhythm controlling meds. -Hold Eliquis  History of CVA (cerebrovascular accident) With residual left-sided weakness.  Holding Eliquis and Plavix.  Continue simvastatin.  Diabetes mellitus type 2 in nonobese [facility]) Prior history of T2DM with mild hyperglycemia on admit.  Not on meds at home. -Check A1c, placed on very sensitive SSI  Hyperlipidemia associated with type 2 diabetes mellitus ([place]) Continue simvastatin.  DVT prophylaxis: SCDs Start: 10/28/22 0114 Code Status: DNR, confirmed with family on  admission Family Communication: Daughter-in-law at bedside Disposition Plan: From home, dispo pending clinical progress Consults called: Orthopedics Severity of Illness: The appropriate patient status for this patient is INPATIENT. Inpatient status is judged to be reasonable and necessary in order to provide the required intensity of service to ensure the patient's safety. The patient's presenting symptoms, physical exam findings, and initial radiographic and laboratory data in the context of their chronic comorbidities is felt to place them at high risk for further clinical deterioration. Furthermore, it is not anticipated that the patient will be medically stable for discharge from the hospital within 2 midnights of admission.   * I certify that at the point of admission it is my clinical judgment that the patient will require inpatient hospital care spanning beyond 2 midnights from the point of admission due to high intensity of service, high risk for further deterioration and high frequency of surveillance required.Zada Finders MD [facility]  If 7PM-7AM, please contact night-coverage [email or web address]  10/28/2022, 1:20 AM

## 2022-10-28 NOTE — ED Provider Notes (Signed)
Emergency Department Provider Note   I have reviewed the triage vital signs and the nursing notes.   HISTORY  Chief Complaint Level 2 Fall   HPI Christy Sutton is a 89 y.o. female with past history of A-fib, diabetes, hypertension presents emergency department for evaluation after mechanical fall.  EMS reported she is complaining of left hip pain.  She is very hard of hearing but notes that she tripped and fell.  EMS reports some swelling to the left wrist and are unsure regarding injury to that location.  She is on Pradaxa.  Unsure of the last dose.  Past Medical History:  Diagnosis Date   Atrial fibrillation ([employer])    Diabetes mellitus without complication ([place])    Hyperlipidemia    Hypertension    Stroke ([place])    x 3   TIA (transient ischemic attack)     Review of Systems  Constitutional: No fever/chills Eyes: No visual changes. ENT: No sore throat. Cardiovascular: Denies chest pain. Respiratory: Denies shortness of breath. Gastrointestinal: No abdominal pain.  No nausea, no vomiting.  No diarrhea.  No constipation. Genitourinary: Negative for dysuria. Musculoskeletal: Negative for back pain. Positive left hip pain.  Skin: Negative for rash. Neurological: Negative for headaches, focal weakness or numbness.   ____________________________________________   PHYSICAL EXAM:  VITAL SIGNS: ED Triage Vitals  Enc Vitals Group     BP 10/27/22 2119 132/70     Pulse Rate 10/27/22 2119 (!) 104     Resp 10/27/22 2119 14     Temp 10/27/22 2206 97.7 F (36.5 C)     Temp Source 10/27/22 2206 Rectal     SpO2 10/27/22 2119 100 %     Weight 10/27/22 2201 135 lb (61.2 kg)     Height 10/27/22 2201 5' 3"$  (1.6 m)   Constitutional: Alert and oriented. No distress. Patient is very hard of hearing.  Eyes: Conjunctivae are normal.  Head: Atraumatic. Nose: No congestion/rhinnorhea. Mouth/Throat: Mucous membranes are moist.  Neck: No stridor.   Cardiovascular: Normal rate,  regular rhythm. Good peripheral circulation. Grossly normal heart sounds.   Respiratory: Normal respiratory effort.  No retractions. Lungs CTAB. Gastrointestinal: Soft and nontender. No distention.  Musculoskeletal: Left lower extremity is shortened and externally rotated.  Minimal tenderness to the left wrist with swelling. Neurologic:  Normal speech and language. No gross focal neurologic deficits are appreciated.  Skin:  Skin is warm, dry and intact. No rash noted. ____________________________________________   LABS (all labs ordered are listed, but only abnormal results are displayed)  Labs Reviewed  COMPREHENSIVE METABOLIC PANEL - Abnormal; Notable for the following components:      Result Value   Glucose, Bld 193 (*)    BUN 7 (*)    Calcium 8.8 (*)    Total Protein 5.9 (*)    Albumin 3.0 (*)    GFR, Estimated 59 (*)    All other components within normal limits  PROTIME-INR - Abnormal; Notable for the following components:   Prothrombin Time 19.5 (*)    INR 1.7 (*)    All other components within normal limits  CBC WITH DIFFERENTIAL/PLATELET  BASIC METABOLIC PANEL  CBC  HEMOGLOBIN A1C  TYPE AND SCREEN   ____________________________________________  EKG   EKG Interpretation  Date/Time:  Friday October 27 2022 21:57:33 EST Ventricular Rate:  109 PR Interval:    QRS Duration: 83 QT Interval:  352 QTC Calculation: 474 R Axis:   68 Text Interpretation: Atrial fibrillation Probable LVH with  secondary repol abnrm Confirmed by Ripley Fraise [id number]) on 10/28/2022 1:00:21 AM        ____________________________________________  RADIOLOGY  DG FEMUR PORT MIN 2 VIEWS LEFT  Result Date: 10/28/2022 CLINICAL DATA:  Left intratrochanteric femoral fracture EXAM: LEFT FEMUR PORTABLE 2 VIEWS COMPARISON:  Films from the previous day. FINDINGS: Proximal intratrochanteric femoral fracture is again seen with some impaction and angulation at the fracture site. The more distal  femur appears within normal limits. Mild degenerative changes about the knee joint are seen. No soft tissue abnormality is noted. IMPRESSION: Stable left intratrochanteric fracture. Electronically Signed   By: Inez Catalina M.D.   On: 10/28/2022 01:01   CT Cervical Spine Wo Contrast  Result Date: 10/27/2022 CLINICAL DATA:  [place], trauma EXAM: CT CERVICAL SPINE WITHOUT CONTRAST TECHNIQUE: Multidetector CT imaging of the cervical spine was performed without intravenous contrast. Multiplanar CT image reconstructions were also generated. RADIATION DOSE REDUCTION: This exam was performed according to the departmental dose-optimization program which includes automated exposure control, adjustment of the mA and/or kV according to patient size and/or use of iterative reconstruction technique. COMPARISON:  08/04/2021 FINDINGS: Alignment: Alignment is anatomic. Skull base and vertebrae: No acute fracture. No primary bone lesion or focal pathologic process. Soft tissues and spinal canal: No prevertebral fluid or swelling. No visible canal hematoma. Disc levels: Multilevel cervical spondylosis from C3-4 through C6-7. Right-sided neural foraminal narrowing at C4-5 and C5-6, with left-sided neural foraminal encroachment at C6-7. Upper chest: Airway is patent. Stable biapical pleural and parenchymal scarring. Left apical pulmonary nodule measures 7 x 8 mm reference image 79/5, unchanged since 03/14/2021. Other: Reconstructed images demonstrate no additional findings. IMPRESSION: 1. No acute cervical spine fracture. 2. Stable multilevel cervical spondylosis. 3. Stable left apical pulmonary nodule, likely benign given Anela Bensman-term stability. Electronically Signed   By: Randa Ngo M.D.   On: 10/27/2022 23:59   CT Head Wo Contrast  Result Date: 10/27/2022 CLINICAL DATA:  [place], trauma EXAM: CT HEAD WITHOUT CONTRAST TECHNIQUE: Contiguous axial images were obtained from the base of the skull through the vertex without intravenous  contrast. RADIATION DOSE REDUCTION: This exam was performed according to the departmental dose-optimization program which includes automated exposure control, adjustment of the mA and/or kV according to patient size and/or use of iterative reconstruction technique. COMPARISON:  08/04/2021 FINDINGS: Brain: No acute infarct or hemorrhage. Lateral ventricles and midline structures are stable. No acute extra-axial fluid collections. No mass effect. Stable diffuse cortical atrophy. Vascular: No hyperdense vessel or unexpected calcification. Skull: Normal. Negative for fracture or focal lesion. Sinuses/Orbits: Chronic opacification of the sphenoid sinus. Remaining paranasal sinuses are clear. Other: None. IMPRESSION: 1. No acute intracranial process. Electronically Signed   By: Randa Ngo M.D.   On: 10/27/2022 23:55   DG Pelvis Portable  Result Date: 10/27/2022 CLINICAL DATA:  [place] EXAM: PORTABLE PELVIS 1-2 VIEWS COMPARISON:  None Available. FINDINGS: Single frontal view of the pelvis excludes portions of the right hip by collimation. There is a mildly displaced intertrochanteric left hip fracture. No significant impaction or angulation. No dislocation. Remainder of the bony pelvis is unremarkable. IMPRESSION: 1. Minimally displaced intertrochanteric left hip fracture. Electronically Signed   By: Randa Ngo M.D.   On: 10/27/2022 22:11   DG Chest Portable 1 View  Result Date: 10/27/2022 CLINICAL DATA:  [place], anticoagulated EXAM: PORTABLE CHEST 1 VIEW COMPARISON:  03/14/2021 FINDINGS: Single frontal view of the chest demonstrates an unremarkable cardiac silhouette. No airspace disease, effusion, or pneumothorax. No acute bony abnormalities.  IMPRESSION: 1. No acute intrathoracic process. Electronically Signed   By: Randa Ngo M.D.   On: 10/27/2022 22:11   DG Wrist Complete Left  Result Date: 10/27/2022 CLINICAL DATA:  Fall, level 2 trauma. EXAM: LEFT WRIST - COMPLETE 3+ VIEW COMPARISON:  None  Available. FINDINGS: There is no evidence of fracture or dislocation. Joint space narrowing and subchondral sclerosis at the first carpometacarpal joint. Osteopenia. Soft tissues are unremarkable. IMPRESSION: 1. No acute fracture or dislocation. 2. Severe degenerative changes at the first carpometacarpal joint. Electronically Signed   By: Keane Police D.O.   On: 10/27/2022 22:10    ____________________________________________   PROCEDURES  Procedure(s) performed:   Procedures  None  ____________________________________________   INITIAL IMPRESSION / ASSESSMENT AND PLAN / ED COURSE  Pertinent labs & imaging results that were available during my care of the patient were reviewed by me and considered in my medical decision making (see chart for details).   This patient is Presenting for Evaluation of fall, which does require a range of treatment options, and is a complaint that involves a high risk of morbidity and mortality.  The Differential Diagnoses includes subdural hematoma, epidural hematoma, acute concussion, traumatic subarachnoid hemorrhage, cerebral contusions, etc.   Critical Interventions-    Medications  HYDROcodone-acetaminophen (NORCO/VICODIN) 5-325 MG per tablet 1-2 tablet (has no administration in time range)  morphine (PF) 2 MG/ML injection 0.5 mg (has no administration in time range)  0.9 %  sodium chloride infusion (has no administration in time range)  senna-docusate (Senokot-S) tablet 1 tablet (has no administration in time range)  ondansetron (ZOFRAN) injection 4 mg (has no administration in time range)  insulin aspart (novoLOG) injection 0-6 Units (has no administration in time range)  sodium chloride 0.9 % bolus 500 mL (500 mLs Intravenous New Bag/Given 10/28/22 0036)    Reassessment after intervention: Pain well controlled.    I did obtain Additional Historical Information from EMS.   Clinical Laboratory Tests Ordered, included no anemia or  leukocytosis.  INR 1.7.  No acute kidney injury.  Radiologic Tests Ordered, included CT head and c spine and hip x-ray. I independently interpreted the images and agree with radiology interpretation.   Cardiac Monitor Tracing which shows NSR.    Social Determinants of Health Risk patient is not an active smoker.   Consult complete with Dr. Stann Mainland with orthopedics. Plan for OR Sunday vs Monday to repair hip.   TRH. Plan for admit.   Medical Decision Making: Summary:  Patient presents emergency department as a level 2 trauma after ground-level fall.  She has a shortened, externally rotated left lower extremity.  Intertrochanteric hip fracture noted on x-ray.  No wrist fracture.  CT imaging of the head unremarkable.  Discussed with orthopedics regarding timing of repair of the hip.  She is on Pradaxa.  Plan for hospitalist admission.   Patient's presentation is most consistent with acute presentation with potential threat to life or bodily function.   Disposition: admit  ____________________________________________  FINAL CLINICAL IMPRESSION(S) / ED DIAGNOSES  Final diagnoses:  Closed fracture of left hip, initial encounter [facility])  Fall, initial encounter   Note:  This document was prepared using Dragon voice recognition software and may include unintentional dictation errors.  Nanda Quinton, MD, [facility] Emergency Medicine    Brittnei Jagiello, [facility], MD 10/28/22 [phone]

## 2022-10-28 NOTE — Consult Note (Signed)
ORTHOPAEDIC CONSULTATION  REQUESTING PHYSICIAN: Bonnell Public, MD  PCP:  Donnajean Lopes, MD  Chief Complaint: Left hip fracture  HPI: Christy Sutton is a 89 y.o. female who complains of ground-level fall prior to arrival earlier this a.m.  She does live independently with her grandson.  She is independent with ADLs.  Does have a history of a cerebrovascular accident and some diminished function of the left lower extremity.  She uses a walker at baseline.  Otherwise no history of previous left hip trauma or surgery.  She presented with decreased function of the left side and was found to have an intertrochanteric fracture left hip.  She denies smoking or diabetes. She does have significant hearing difficulty and much of the HPI was obtained from her son at the bedside.  Past Medical History:  Diagnosis Date   Atrial fibrillation ([facility])    Diabetes mellitus without complication ([place])    Hyperlipidemia    Hypertension    Stroke ([place])    x 3   TIA (transient ischemic attack)    Past Surgical History:  Procedure Laterality Date   ABDOMINAL HYSTERECTOMY     due to tubal pregnancies   TEE WITHOUT CARDIOVERSION N/A 01/30/2017   Procedure: TRANSESOPHAGEAL ECHOCARDIOGRAM (TEE);  Surgeon: Adrian Prows, MD;  Location: [facility] ENDOSCOPY;  Service: Cardiovascular;  Laterality: N/A;   TUBAL LIGATION     Social History   Socioeconomic History   Marital status: Widowed    Spouse name: Not on file   Number of children: 2   Years of education: 78   Highest education level: Not on file  Occupational History    Comment: NA  Tobacco Use   Smoking status: Former    Types: Cigarettes   Smokeless tobacco: Never  Vaping Use   Vaping Use: Never used  Substance and Sexual Activity   Alcohol use: No   Drug use: No   Sexual activity: Not on file  Other Topics Concern   Not on file  Social History Narrative   Lives with grandson   Caffeine- 12 oz Pepsi, coffee 1/2 cup   Social Determinants  of Health   Financial Resource Strain: Not on file  Food Insecurity: Not on file  Transportation Needs: Not on file  Physical Activity: Not on file  Stress: Not on file  Social Connections: Not on file   Family History  Problem Relation Age of Onset   Heart attack Father    Stroke Father    Stroke Mother    Stroke Sister    Stroke Sister    Allergies  Allergen Reactions   Penicillins Hives    Has patient had a PCN reaction causing immediate rash, facial/tongue/throat swelling, SOB or lightheadedness with hypotension: Yes Has patient had a PCN reaction causing severe rash involving mucus membranes or skin necrosis: Unknown Has patient had a PCN reaction that required hospitalization: Unknown Has patient had a PCN reaction occurring within the last 10 years: No If all of the above answers are "NO", then may proceed with Cephalosporin use.    Prior to Admission medications   Medication Sig Start Date End Date Taking? Authorizing Provider  clopidogrel (PLAVIX) 75 MG tablet Take 75 mg by mouth daily. 04/15/21  Yes [provider]  ELIQUIS 2.5 MG TABS tablet Take 2.5 mg by mouth 2 (two) times daily.   Yes [provider]  furosemide (LASIX) 20 MG tablet Take 20 mg by mouth 2 (two) times a week. Monday  and Friday 03/27/21  Yes [provider]  KLOR-CON M20 20 MEQ tablet Take 20 mEq by mouth every Monday, Wednesday, and Friday. 03/28/21  Yes [provider]  Multiple Vitamin (MULTIVITAMIN) tablet Take 1 tablet by mouth daily.   Yes [provider]  simvastatin (ZOCOR) 40 MG tablet Take 40 mg by mouth daily. 04/15/21  Yes [provider]   DG FEMUR PORT MIN 2 VIEWS LEFT  Result Date: 10/28/2022 CLINICAL DATA:  Left intratrochanteric femoral fracture EXAM: LEFT FEMUR PORTABLE 2 VIEWS COMPARISON:  Films from the previous day. FINDINGS: Proximal intratrochanteric femoral fracture is again seen with some impaction and angulation at the fracture  site. The more distal femur appears within normal limits. Mild degenerative changes about the knee joint are seen. No soft tissue abnormality is noted. IMPRESSION: Stable left intratrochanteric fracture. Electronically Signed   By: Inez Catalina M.D.   On: 10/28/2022 01:01   CT Cervical Spine Wo Contrast  Result Date: 10/27/2022 CLINICAL DATA:  [place], trauma EXAM: CT CERVICAL SPINE WITHOUT CONTRAST TECHNIQUE: Multidetector CT imaging of the cervical spine was performed without intravenous contrast. Multiplanar CT image reconstructions were also generated. RADIATION DOSE REDUCTION: This exam was performed according to the departmental dose-optimization program which includes automated exposure control, adjustment of the mA and/or kV according to patient size and/or use of iterative reconstruction technique. COMPARISON:  08/04/2021 FINDINGS: Alignment: Alignment is anatomic. Skull base and vertebrae: No acute fracture. No primary bone lesion or focal pathologic process. Soft tissues and spinal canal: No prevertebral fluid or swelling. No visible canal hematoma. Disc levels: Multilevel cervical spondylosis from C3-4 through C6-7. Right-sided neural foraminal narrowing at C4-5 and C5-6, with left-sided neural foraminal encroachment at C6-7. Upper chest: Airway is patent. Stable biapical pleural and parenchymal scarring. Left apical pulmonary nodule measures 7 x 8 mm reference image 79/5, unchanged since 03/14/2021. Other: Reconstructed images demonstrate no additional findings. IMPRESSION: 1. No acute cervical spine fracture. 2. Stable multilevel cervical spondylosis. 3. Stable left apical pulmonary nodule, likely benign given long-term stability. Electronically Signed   By: Randa Ngo M.D.   On: 10/27/2022 23:59   CT Head Wo Contrast  Result Date: 10/27/2022 CLINICAL DATA:  [place], trauma EXAM: CT HEAD WITHOUT CONTRAST TECHNIQUE: Contiguous axial images were obtained from the base of the skull through the  vertex without intravenous contrast. RADIATION DOSE REDUCTION: This exam was performed according to the departmental dose-optimization program which includes automated exposure control, adjustment of the mA and/or kV according to patient size and/or use of iterative reconstruction technique. COMPARISON:  08/04/2021 FINDINGS: Brain: No acute infarct or hemorrhage. Lateral ventricles and midline structures are stable. No acute extra-axial fluid collections. No mass effect. Stable diffuse cortical atrophy. Vascular: No hyperdense vessel or unexpected calcification. Skull: Normal. Negative for fracture or focal lesion. Sinuses/Orbits: Chronic opacification of the sphenoid sinus. Remaining paranasal sinuses are clear. Other: None. IMPRESSION: 1. No acute intracranial process. Electronically Signed   By: Randa Ngo M.D.   On: 10/27/2022 23:55   DG Pelvis Portable  Result Date: 10/27/2022 CLINICAL DATA:  [place] EXAM: PORTABLE PELVIS 1-2 VIEWS COMPARISON:  None Available. FINDINGS: Single frontal view of the pelvis excludes portions of the right hip by collimation. There is a mildly displaced intertrochanteric left hip fracture. No significant impaction or angulation. No dislocation. Remainder of the bony pelvis is unremarkable. IMPRESSION: 1. Minimally displaced intertrochanteric left hip fracture. Electronically Signed   By: Randa Ngo M.D.   On: 10/27/2022 22:11  DG Chest Portable 1 View  Result Date: 10/27/2022 CLINICAL DATA:  [place], anticoagulated EXAM: PORTABLE CHEST 1 VIEW COMPARISON:  03/14/2021 FINDINGS: Single frontal view of the chest demonstrates an unremarkable cardiac silhouette. No airspace disease, effusion, or pneumothorax. No acute bony abnormalities. IMPRESSION: 1. No acute intrathoracic process. Electronically Signed   By: Randa Ngo M.D.   On: 10/27/2022 22:11   DG Wrist Complete Left  Result Date: 10/27/2022 CLINICAL DATA:  Fall, level 2 trauma. EXAM: LEFT WRIST - COMPLETE 3+ VIEW  COMPARISON:  None Available. FINDINGS: There is no evidence of fracture or dislocation. Joint space narrowing and subchondral sclerosis at the first carpometacarpal joint. Osteopenia. Soft tissues are unremarkable. IMPRESSION: 1. No acute fracture or dislocation. 2. Severe degenerative changes at the first carpometacarpal joint. Electronically Signed   By: Keane Police D.O.   On: 10/27/2022 22:10    Positive ROS: All other systems have been reviewed and were otherwise negative with the exception of those mentioned in the HPI and as above.  Physical Exam: General: Alert, no acute distress Cardiovascular: No pedal edema Respiratory: No cyanosis, no use of accessory musculature GI: No organomegaly, abdomen is soft and non-tender Skin: No lesions in the area of chief complaint Neurologic: Sensation intact distally Psychiatric: Patient is competent for consent with normal mood and affect Lymphatic: No axillary or cervical lymphadenopathy  MUSCULOSKELETAL: Left lower extremity is slightly externally rotated but not shortened.  Distally neurovascularly intact.  She does have limitations with dorsiflexion of the ankle but sensation intact.  2+ dorsalis pedis pulse and 1+ posterior tibialis  Assessment: Left hip closed intertrochanteric fracture  Plan: Discussed with the patient and her son at the bedside the recommendation for IM nail to treat the intertrochanteric fracture.  This is a 2 part standard obliquity.  This will allow immediate weightbearing.  She is on longstanding Pradaxa so we will have to wait until tomorrow for surgery.  Family is in agreement with the plan.  She will be immediately weightbearing as tolerated postoperatively will need physical therapy postop to evaluate for dispositional planning.  The risks, benefits, and alternatives were discussed with the patient. There are risks associated with the surgery including, but not limited to, problems with anesthesia (death),  infection, differences in leg length/angulation/rotation, fracture of bones, loosening or failure of implants, malunion, nonunion, hematoma (blood accumulation) which may require surgical drainage, blood clots, pulmonary embolism, nerve injury (foot drop), and blood vessel injury. The patient understands these risks and elects to proceed.     Nicholes Stairs, MD Cell [phone]    10/28/2022 12:33 PM

## 2022-10-28 NOTE — Assessment & Plan Note (Signed)
Patient with chronic A-fib with controlled rate.  Not on rate/rhythm controlling meds. -Hold Eliquis

## 2022-10-28 NOTE — Progress Notes (Signed)
   10/28/22 0015  Spiritual Encounters  Type of Visit Attempt (pt unavailable)  Conversation partners present during encounter Physician  OnCall Visit Yes   Responded to page and visited patient room. Advised by medical team presence not needed at tgis time, no family present. Will page if needed.

## 2022-10-29 ENCOUNTER — Other Ambulatory Visit: Payer: Self-pay

## 2022-10-29 ENCOUNTER — Inpatient Hospital Stay (HOSPITAL_COMMUNITY): Payer: Medicare Other | Admitting: Certified Registered"

## 2022-10-29 ENCOUNTER — Encounter (HOSPITAL_COMMUNITY)
Admission: EM | Disposition: A | Discharge status: Skilled Nursing Facility | Payer: Self-pay | Source: Home / Self Care | Attending: Internal Medicine

## 2022-10-29 ENCOUNTER — Encounter (HOSPITAL_COMMUNITY): Payer: Self-pay | Admitting: Internal Medicine

## 2022-10-29 ENCOUNTER — Inpatient Hospital Stay (HOSPITAL_COMMUNITY): Payer: Medicare Other

## 2022-10-29 DIAGNOSIS — S72142A Displaced intertrochanteric fracture of left femur, initial encounter for closed fracture: Secondary | ICD-10-CM | POA: Diagnosis not present

## 2022-10-29 DIAGNOSIS — Z87891 Personal history of nicotine dependence: Secondary | ICD-10-CM

## 2022-10-29 DIAGNOSIS — I10 Essential (primary) hypertension: Secondary | ICD-10-CM | POA: Diagnosis not present

## 2022-10-29 DIAGNOSIS — E119 Type 2 diabetes mellitus without complications: Secondary | ICD-10-CM | POA: Diagnosis not present

## 2022-10-29 HISTORY — PX: INTRAMEDULLARY (IM) NAIL INTERTROCHANTERIC: SHX5875

## 2022-10-29 LAB — GLUCOSE, CAPILLARY
Glucose-Capillary: 118 mg/dL — ABNORMAL HIGH (ref 70–99)
Glucose-Capillary: 161 mg/dL — ABNORMAL HIGH (ref 70–99)
Glucose-Capillary: 184 mg/dL — ABNORMAL HIGH (ref 70–99)
Glucose-Capillary: 193 mg/dL — ABNORMAL HIGH (ref 70–99)

## 2022-10-29 SURGERY — FIXATION, FRACTURE, INTERTROCHANTERIC, WITH INTRAMEDULLARY ROD
Anesthesia: General | Laterality: Left

## 2022-10-29 MED ORDER — ROCURONIUM BROMIDE 10 MG/ML (PF) SYRINGE
PREFILLED_SYRINGE | INTRAVENOUS | Status: DC | PRN
  Administered 2022-10-29: 50 mg via INTRAVENOUS

## 2022-10-29 MED ORDER — TRANEXAMIC ACID-NACL 1000-0.7 MG/100ML-% IV SOLN
INTRAVENOUS | Status: AC
  Filled 2022-10-29: qty 100

## 2022-10-29 MED ORDER — CHLORHEXIDINE GLUCONATE CLOTH 2 % EX PADS
6.0000 | MEDICATED_PAD | Freq: Every day | CUTANEOUS | Status: AC
  Administered 2022-10-30 – 2022-11-03 (×5): 6 via TOPICAL

## 2022-10-29 MED ORDER — ONDANSETRON HCL 4 MG/2ML IJ SOLN
INTRAMUSCULAR | Status: AC
  Filled 2022-10-29: qty 2

## 2022-10-29 MED ORDER — ROCURONIUM BROMIDE 10 MG/ML (PF) SYRINGE
PREFILLED_SYRINGE | INTRAVENOUS | Status: AC
  Filled 2022-10-29: qty 10

## 2022-10-29 MED ORDER — TRANEXAMIC ACID-NACL 1000-0.7 MG/100ML-% IV SOLN
1000.0000 mg | Freq: Once | INTRAVENOUS | Status: AC
  Administered 2022-10-29: 1000 mg via INTRAVENOUS
  Filled 2022-10-29: qty 100

## 2022-10-29 MED ORDER — PHENYLEPHRINE 80 MCG/ML (10ML) SYRINGE FOR IV PUSH (FOR BLOOD PRESSURE SUPPORT)
PREFILLED_SYRINGE | INTRAVENOUS | Status: DC | PRN
  Administered 2022-10-29 (×4): 80 ug via INTRAVENOUS
  Administered 2022-10-29: 160 ug via INTRAVENOUS

## 2022-10-29 MED ORDER — METOCLOPRAMIDE HCL 5 MG/ML IJ SOLN: 5.0000 mg | Freq: Three times a day (TID) | INTRAMUSCULAR | Status: DC | PRN

## 2022-10-29 MED ORDER — CHLORHEXIDINE GLUCONATE 0.12 % MT SOLN
OROMUCOSAL | Status: AC
  Administered 2022-10-29: 15 mL via OROMUCOSAL
  Filled 2022-10-29: qty 15

## 2022-10-29 MED ORDER — ORAL CARE MOUTH RINSE: 15.0000 mL | Freq: Once | OROMUCOSAL | Status: AC

## 2022-10-29 MED ORDER — MUPIROCIN 2 % EX OINT
1.0000 | TOPICAL_OINTMENT | Freq: Two times a day (BID) | CUTANEOUS | Status: AC
  Administered 2022-10-29 – 2022-11-03 (×10): 1 via NASAL
  Filled 2022-10-29 (×2): qty 22

## 2022-10-29 MED ORDER — METOCLOPRAMIDE HCL 5 MG PO TABS: 5.0000 mg | ORAL_TABLET | Freq: Three times a day (TID) | ORAL | Status: DC | PRN

## 2022-10-29 MED ORDER — PHENYLEPHRINE 80 MCG/ML (10ML) SYRINGE FOR IV PUSH (FOR BLOOD PRESSURE SUPPORT)
PREFILLED_SYRINGE | INTRAVENOUS | Status: AC
  Filled 2022-10-29: qty 10

## 2022-10-29 MED ORDER — PHENYLEPHRINE HCL (PRESSORS) 10 MG/ML IV SOLN
INTRAVENOUS | Status: AC
  Filled 2022-10-29: qty 1

## 2022-10-29 MED ORDER — DEXAMETHASONE SODIUM PHOSPHATE 10 MG/ML IJ SOLN
INTRAMUSCULAR | Status: AC
  Filled 2022-10-29: qty 1

## 2022-10-29 MED ORDER — ONDANSETRON HCL 4 MG/2ML IJ SOLN: 4.0000 mg | Freq: Four times a day (QID) | INTRAMUSCULAR | Status: DC | PRN

## 2022-10-29 MED ORDER — FENTANYL CITRATE (PF) 250 MCG/5ML IJ SOLN
INTRAMUSCULAR | Status: DC | PRN
  Administered 2022-10-29 (×2): 25 ug via INTRAVENOUS

## 2022-10-29 MED ORDER — PROPOFOL 10 MG/ML IV BOLUS
INTRAVENOUS | Status: DC | PRN
  Administered 2022-10-29: 60 mg via INTRAVENOUS

## 2022-10-29 MED ORDER — ONDANSETRON HCL 4 MG PO TABS: 4.0000 mg | ORAL_TABLET | Freq: Four times a day (QID) | ORAL | Status: DC | PRN

## 2022-10-29 MED ORDER — DOCUSATE SODIUM 100 MG PO CAPS
100.0000 mg | ORAL_CAPSULE | Freq: Two times a day (BID) | ORAL | Status: DC
  Administered 2022-10-29 – 2022-10-30 (×2): 100 mg via ORAL
  Filled 2022-10-29 (×3): qty 1

## 2022-10-29 MED ORDER — 0.9 % SODIUM CHLORIDE (POUR BTL) OPTIME
TOPICAL | Status: DC | PRN
  Administered 2022-10-29: 1000 mL

## 2022-10-29 MED ORDER — FENTANYL CITRATE (PF) 250 MCG/5ML IJ SOLN
INTRAMUSCULAR | Status: AC
  Filled 2022-10-29: qty 5

## 2022-10-29 MED ORDER — FENTANYL CITRATE (PF) 100 MCG/2ML IJ SOLN: 25.0000 ug | INTRAMUSCULAR | Status: DC | PRN

## 2022-10-29 MED ORDER — LIDOCAINE 2% (20 MG/ML) 5 ML SYRINGE
INTRAMUSCULAR | Status: AC
  Filled 2022-10-29: qty 5

## 2022-10-29 MED ORDER — CEFAZOLIN SODIUM-DEXTROSE 2-4 GM/100ML-% IV SOLN
INTRAVENOUS | Status: AC
  Filled 2022-10-29: qty 100

## 2022-10-29 MED ORDER — CEFAZOLIN SODIUM-DEXTROSE 2-4 GM/100ML-% IV SOLN
2.0000 g | Freq: Three times a day (TID) | INTRAVENOUS | Status: AC
  Administered 2022-10-29 – 2022-10-30 (×2): 2 g via INTRAVENOUS
  Filled 2022-10-29 (×2): qty 100

## 2022-10-29 MED ORDER — LIDOCAINE 2% (20 MG/ML) 5 ML SYRINGE
INTRAMUSCULAR | Status: DC | PRN
  Administered 2022-10-29: 40 mg via INTRAVENOUS

## 2022-10-29 MED ORDER — PROPOFOL 10 MG/ML IV BOLUS
INTRAVENOUS | Status: AC
  Filled 2022-10-29: qty 20

## 2022-10-29 MED ORDER — SUGAMMADEX SODIUM 200 MG/2ML IV SOLN
INTRAVENOUS | Status: DC | PRN
  Administered 2022-10-29: 150 mg via INTRAVENOUS

## 2022-10-29 MED ORDER — PHENOL 1.4 % MT LIQD: 1.0000 | OROMUCOSAL | Status: DC | PRN

## 2022-10-29 MED ORDER — ONDANSETRON HCL 4 MG/2ML IJ SOLN
INTRAMUSCULAR | Status: DC | PRN
  Administered 2022-10-29: 4 mg via INTRAVENOUS

## 2022-10-29 MED ORDER — CHLORHEXIDINE GLUCONATE 0.12 % MT SOLN: 15.0000 mL | Freq: Once | OROMUCOSAL | Status: AC

## 2022-10-29 MED ORDER — INSULIN ASPART 100 UNIT/ML IJ SOLN: 0.0000 [IU] | INTRAMUSCULAR | Status: DC | PRN

## 2022-10-29 MED ORDER — LACTATED RINGERS IV SOLN: INTRAVENOUS | Status: DC

## 2022-10-29 MED ORDER — MENTHOL 3 MG MT LOZG: 1.0000 | LOZENGE | OROMUCOSAL | Status: DC | PRN

## 2022-10-29 SURGICAL SUPPLY — 43 items
ALCOHOL 70% 16 OZ (MISCELLANEOUS) ×2 IMPLANT
BAG COUNTER SPONGE SURGICOUNT (BAG) ×2 IMPLANT
BAG SPNG CNTER NS LX DISP (BAG) ×1
BIT DRILL CALIBRATED 4.2 (BIT) IMPLANT
BIT DRILL CANN 16 HIP (BIT) IMPLANT
BIT DRILL CANN STP 6/9 HIP (BIT) IMPLANT
BNDG CMPR 5X6 CHSV STRCH STRL (GAUZE/BANDAGES/DRESSINGS) ×2
BNDG COHESIVE 6X5 TAN ST LF (GAUZE/BANDAGES/DRESSINGS) ×4 IMPLANT
CANISTER SUCT 3000ML PPV (MISCELLANEOUS) ×2 IMPLANT
COVER PERINEAL POST (MISCELLANEOUS) ×2 IMPLANT
COVER SURGICAL LIGHT HANDLE (MISCELLANEOUS) ×2 IMPLANT
DRAPE C-ARM 42X72 X-RAY (DRAPES) ×2 IMPLANT
DRAPE HALF SHEET 40X57 (DRAPES) IMPLANT
DRAPE INCISE IOBAN 66X45 STRL (DRAPES) ×2 IMPLANT
DRAPE STERI IOBAN 125X83 (DRAPES) ×2 IMPLANT
DRILL BIT CALIBRATED 4.2 (BIT) ×1
DRSG ADAPTIC 3X8 NADH LF (GAUZE/BANDAGES/DRESSINGS) ×2 IMPLANT
DRSG AQUACEL AG ADV 3.5X 4 (GAUZE/BANDAGES/DRESSINGS) IMPLANT
DURAPREP 26ML APPLICATOR (WOUND CARE) ×2 IMPLANT
ELECT CAUTERY BLADE 6.4 (BLADE) ×2 IMPLANT
ELECT REM PT RETURN 9FT ADLT (ELECTROSURGICAL) ×1
ELECTRODE REM PT RTRN 9FT ADLT (ELECTROSURGICAL) ×2 IMPLANT
GAUZE SPONGE 4X4 12PLY STRL LF (GAUZE/BANDAGES/DRESSINGS) ×2 IMPLANT
GLOVE BIO SURGEON STRL SZ7.5 (GLOVE) ×4 IMPLANT
GLOVE BIOGEL PI IND STRL 8 (GLOVE) ×4 IMPLANT
GOWN STRL REUS W/ TWL LRG LVL3 (GOWN DISPOSABLE) ×2 IMPLANT
GOWN STRL REUS W/ TWL XL LVL3 (GOWN DISPOSABLE) ×4 IMPLANT
GOWN STRL REUS W/TWL LRG LVL3 (GOWN DISPOSABLE) ×1
GOWN STRL REUS W/TWL XL LVL3 (GOWN DISPOSABLE) ×2
GUIDEWIRE 3.2X400 (WIRE) IMPLANT
KIT BASIN OR (CUSTOM PROCEDURE TRAY) ×2 IMPLANT
KIT TURNOVER KIT B (KITS) ×2 IMPLANT
NAIL TROCH FIX 10X170 130 (Nail) IMPLANT
NS IRRIG 1000ML POUR BTL (IV SOLUTION) ×2 IMPLANT
PACK GENERAL/GYN (CUSTOM PROCEDURE TRAY) ×2 IMPLANT
PAD ARMBOARD 7.5X6 YLW CONV (MISCELLANEOUS) ×4 IMPLANT
SCREW FENES TFNA 100 (Screw) IMPLANT
SCREW LOCK STAR 5X36 (Screw) IMPLANT
STAPLER VISISTAT 35W (STAPLE) ×2 IMPLANT
SUT MON AB 2-0 CT1 36 (SUTURE) ×2 IMPLANT
TOWEL GREEN STERILE (TOWEL DISPOSABLE) ×2 IMPLANT
TOWEL GREEN STERILE FF (TOWEL DISPOSABLE) ×2 IMPLANT
WATER STERILE IRR 1000ML POUR (IV SOLUTION) ×2 IMPLANT

## 2022-10-29 NOTE — Anesthesia Preprocedure Evaluation (Addendum)
Anesthesia Evaluation  Patient identified by MRN, date of birth, ID band Patient awake    Reviewed: Allergy & Precautions, NPO status , Patient's Chart, lab work & pertinent test results  Airway Mallampati: II       Dental  (+) Missing, Chipped   Pulmonary former smoker   Pulmonary exam normal        Cardiovascular hypertension, Pt. on medications  Rhythm:Regular     Neuro/Psych TIACVA    GI/Hepatic negative GI ROS, Neg liver ROS,,,  Endo/Other  diabetes    Renal/GU negative Renal ROS     Musculoskeletal negative musculoskeletal ROS (+)    Abdominal   Peds  Hematology negative hematology ROS (+)   Anesthesia Other Findings   Reproductive/Obstetrics                             Anesthesia Physical Anesthesia Plan  ASA: 3  Anesthesia Plan: General   Post-op Pain Management: Ofirmev IV (intra-op)*   Induction: Intravenous  PONV Risk Score and Plan: 4 or greater and Ondansetron and Treatment may vary due to age or medical condition  Airway Management Planned: Oral ETT  Additional Equipment: None  Intra-op Plan:   Post-operative Plan: Extubation in OR  Informed Consent: I have reviewed the patients History and Physical, chart, labs and discussed the procedure including the risks, benefits and alternatives for the proposed anesthesia with the patient or authorized representative who has indicated his/her understanding and acceptance.   Patient has DNR.  Discussed DNR with power of attorney and Continue DNR.   Consent reviewed with POA  Plan Discussed with: CRNA  Anesthesia Plan Comments:        Anesthesia Quick Evaluation

## 2022-10-29 NOTE — Transfer of Care (Signed)
Immediate Anesthesia Transfer of Care Note  Patient: Christy Sutton  Procedure(s) Performed: INTRAMEDULLARY (IM) NAIL INTERTROCHANTERIC (Left)  Patient Location: PACU  Anesthesia Type:General  Level of Consciousness: awake, alert , oriented, and patient cooperative  Airway & Oxygen Therapy: Patient Spontanous Breathing  Post-op Assessment: Report given to RN and Post -op Vital signs reviewed and stable  Post vital signs: stable and reviewed  Last Vitals:  Vitals Value Taken Time  BP 123/68 10/29/22 0900  Temp    Pulse 123 10/29/22 0901  Resp 21 10/29/22 0901  SpO2 96 % 10/29/22 0901  Vitals shown include unvalidated device data.  Last Pain:  Vitals:   10/29/22 0604  TempSrc: Axillary  PainSc:          Complications: No notable events documented.

## 2022-10-29 NOTE — Progress Notes (Signed)
PROGRESS NOTE    Christy Sutton  [id number] DOB: 24-Apr-1926 DOA: 10/27/2022 PCP: Donnajean Lopes, MD  Outpatient Specialists:     Brief Narrative:  Patient is a 89 year old female with a history of atrial fibrillation, diabetes mellitus, hyperlipidemia, hypertension and stroke.  Patient was admitted with left hip fracture following a fall.  Orthopedic team is assisting with patient's management.  Patient underwent left intertrochanteric intramedullary nail today, 10/29/2022.  Eliquis and Plavix on hold.   Assessment & Plan:   Principal Problem:   Closed intertrochanteric fracture of left femur, initial encounter ([place]) Active Problems:   Diabetes mellitus type 2 in nonobese ([facility])   History of CVA (cerebrovascular accident)   Hyperlipidemia associated with type 2 diabetes mellitus ([place])   Atrial fibrillation, chronic ([place])  Closed intertrochanteric fracture of left femur, initial encounter ([place]) -Occurring after fall at home. -See above documentation. -Patient underwent intramedullary nailing today. -Orthopedic team is directing postop care. -Currently, Eliquis and Plavix are on hold.  Please liaise with orthopedic team on when to restart Plavix and Eliquis. -Pain is controlled.     Atrial fibrillation, chronic ([place]) Patient with chronic A-fib with controlled rate.   Not on rate/rhythm controlling meds. Eliquis is currently on hold.   History of CVA (cerebrovascular accident) With residual left-sided weakness.   Holding Eliquis and Plavix.   Continue simvastatin.   Diabetes mellitus type 2 in nonobese [facility]) Prior history of T2DM with mild hyperglycemia on admit.   Not on meds at home. -A1c was 5.7%.     Hyperlipidemia associated with type 2 diabetes mellitus ([facility]) Continue simvastatin.   DVT prophylaxis: SCD. Code Status: DO NOT RESUSCITATE. Family Communication:  Disposition Plan: This will depend on hospital course.   Consultants:  Orthopedic  surgery.  Procedures:  None for now.  Antimicrobials:  Perioperative cefazolin.  Subjective: Patient is a poor historian. No pain. No fever or chills.  Objective: Vitals:   10/29/22 1000 10/29/22 1140 10/29/22 1207 10/29/22 1529  BP:  98/60 100/62 115/62  Pulse:  (!) 132 82 98  Resp:  18 18 20  $ Temp:  98.3 F (36.8 C) 98.5 F (36.9 C) 98.1 F (36.7 C)  TempSrc:  Oral  Oral  SpO2: 96% 100% 100% 98%  Weight:      Height:        Intake/Output Summary (Last 24 hours) at 10/29/2022 1738 Last data filed at 10/29/2022 0847 Gross per 24 hour  Intake 220 ml  Output 100 ml  Net 120 ml    Filed Weights   10/27/22 2201 10/29/22 0653  Weight: 61.2 kg 61.2 kg    Examination:  General exam: Appears calm and comfortable  Respiratory system: Clear to auscultation.  Cardiovascular system: S1 & S2 heard Gastrointestinal system: Abdomen is nondistended, soft and nontender.  Central nervous system: Awake and alert.     Data Reviewed: I have personally reviewed following labs and imaging studies  CBC: Recent Labs  Lab 10/27/22 2200 10/28/22 0341  WBC 9.8 12.8*  NEUTROABS 7.3  --   HGB 12.2 11.7*  HCT 37.2 35.7*  MCV 93.0 93.7  PLT 263 0000000    Basic Metabolic Panel: Recent Labs  Lab 10/27/22 2200 10/28/22 0341  NA 140 140  K 3.9 4.3  CL 106 108  CO2 22 21*  GLUCOSE 193* 166*  BUN 7* 7*  CREATININE 0.90 0.85  CALCIUM 8.8* 8.7*    GFR: Estimated Creatinine Clearance: 32.8 mL/min (by C-G formula based on SCr of  0.85 mg/dL). Liver Function Tests: Recent Labs  Lab 10/27/22 2200  AST 34  ALT 19  ALKPHOS 57  BILITOT 0.5  PROT 5.9*  ALBUMIN 3.0*    No results for input(s): "LIPASE", "AMYLASE" in the last 168 hours. No results for input(s): "AMMONIA" in the last 168 hours. Coagulation Profile: Recent Labs  Lab 10/27/22 2200  INR 1.7*    Cardiac Enzymes: No results for input(s): "CKTOTAL", "CKMB", "CKMBINDEX", "TROPONINI" in the last 168  hours. BNP (last 3 results) No results for input(s): "PROBNP" in the last 8760 hours. HbA1C: Recent Labs    10/28/22 0341  HGBA1C 5.7*    CBG: Recent Labs  Lab 10/28/22 1638 10/28/22 2246 10/29/22 0629 10/29/22 1157 10/29/22 1617  GLUCAP 157* 169* 118* 161* 184*    Lipid Profile: No results for input(s): "CHOL", "HDL", "LDLCALC", "TRIG", "CHOLHDL", "LDLDIRECT" in the last 72 hours. Thyroid Function Tests: No results for input(s): "TSH", "T4TOTAL", "FREET4", "T3FREE", "THYROIDAB" in the last 72 hours. Anemia Panel: No results for input(s): "VITAMINB12", "FOLATE", "FERRITIN", "TIBC", "IRON", "RETICCTPCT" in the last 72 hours. Urine analysis:    Component Value Date/Time   COLORURINE YELLOW 03/14/2021 1528   APPEARANCEUR CLEAR 03/14/2021 1528   LABSPEC 1.008 03/14/2021 1528   PHURINE 5.0 03/14/2021 1528   GLUCOSEU NEGATIVE 03/14/2021 1528   HGBUR NEGATIVE 03/14/2021 [place] 03/14/2021 1528   KETONESUR 5 (A) 03/14/2021 1528   PROTEINUR NEGATIVE 03/14/2021 1528   NITRITE NEGATIVE 03/14/2021 1528   LEUKOCYTESUR NEGATIVE 03/14/2021 1528   Sepsis Labs: @LABRCNTIP$ (procalcitonin:4,lacticidven:4)  ) Recent Results (from the past 240 hour(s))  Surgical pcr screen     Status: Abnormal   Collection Time: 10/28/22  8:17 PM   Specimen: Nasal Mucosa; Nasal Swab  Result Value Ref Range Status   MRSA, PCR NEGATIVE NEGATIVE Final   Staphylococcus aureus POSITIVE (A) NEGATIVE Final    Comment: (NOTE) The Xpert SA Assay (FDA approved for NASAL specimens in patients 6 years of age and older), is one component of a comprehensive surveillance program. It is not intended to diagnose infection nor to guide or monitor treatment. Performed at [facility], [place] [address], [place], [place] [address]          Radiology Studies: DG HIP UNILAT WITH PELVIS 2-3 VIEWS LEFT  Result Date: 10/29/2022 CLINICAL DATA:  Fluoro guidance provided EXAM: DG HIP (WITH  OR WITHOUT PELVIS) 2-3V LEFT FINDINGS: Dose: 1.5 mGy Fluoro time 22s IMPRESSION: C-arm fluoro guidance provided. Electronically Signed   By: Sammie Bench M.D.   On: 10/29/2022 09:24   DG C-Arm 1-60 Min-No Report  Result Date: 10/29/2022 Fluoroscopy was utilized by the requesting physician.  No radiographic interpretation.   DG FEMUR PORT MIN 2 VIEWS LEFT  Result Date: 10/28/2022 CLINICAL DATA:  Left intratrochanteric femoral fracture EXAM: LEFT FEMUR PORTABLE 2 VIEWS COMPARISON:  Films from the previous day. FINDINGS: Proximal intratrochanteric femoral fracture is again seen with some impaction and angulation at the fracture site. The more distal femur appears within normal limits. Mild degenerative changes about the knee joint are seen. No soft tissue abnormality is noted. IMPRESSION: Stable left intratrochanteric fracture. Electronically Signed   By: Inez Catalina M.D.   On: 10/28/2022 01:01   CT Cervical Spine Wo Contrast  Result Date: 10/27/2022 CLINICAL DATA:  [place], trauma EXAM: CT CERVICAL SPINE WITHOUT CONTRAST TECHNIQUE: Multidetector CT imaging of the cervical spine was performed without intravenous contrast. Multiplanar CT image reconstructions were also generated. RADIATION  DOSE REDUCTION: This exam was performed according to the departmental dose-optimization program which includes automated exposure control, adjustment of the mA and/or kV according to patient size and/or use of iterative reconstruction technique. COMPARISON:  08/04/2021 FINDINGS: Alignment: Alignment is anatomic. Skull base and vertebrae: No acute fracture. No primary bone lesion or focal pathologic process. Soft tissues and spinal canal: No prevertebral fluid or swelling. No visible canal hematoma. Disc levels: Multilevel cervical spondylosis from C3-4 through C6-7. Right-sided neural foraminal narrowing at C4-5 and C5-6, with left-sided neural foraminal encroachment at C6-7. Upper chest: Airway is patent. Stable  biapical pleural and parenchymal scarring. Left apical pulmonary nodule measures 7 x 8 mm reference image 79/5, unchanged since 03/14/2021. Other: Reconstructed images demonstrate no additional findings. IMPRESSION: 1. No acute cervical spine fracture. 2. Stable multilevel cervical spondylosis. 3. Stable left apical pulmonary nodule, likely benign given long-term stability. Electronically Signed   By: Randa Ngo M.D.   On: 10/27/2022 23:59   CT Head Wo Contrast  Result Date: 10/27/2022 CLINICAL DATA:  [place], trauma EXAM: CT HEAD WITHOUT CONTRAST TECHNIQUE: Contiguous axial images were obtained from the base of the skull through the vertex without intravenous contrast. RADIATION DOSE REDUCTION: This exam was performed according to the departmental dose-optimization program which includes automated exposure control, adjustment of the mA and/or kV according to patient size and/or use of iterative reconstruction technique. COMPARISON:  08/04/2021 FINDINGS: Brain: No acute infarct or hemorrhage. Lateral ventricles and midline structures are stable. No acute extra-axial fluid collections. No mass effect. Stable diffuse cortical atrophy. Vascular: No hyperdense vessel or unexpected calcification. Skull: Normal. Negative for fracture or focal lesion. Sinuses/Orbits: Chronic opacification of the sphenoid sinus. Remaining paranasal sinuses are clear. Other: None. IMPRESSION: 1. No acute intracranial process. Electronically Signed   By: Randa Ngo M.D.   On: 10/27/2022 23:55   DG Pelvis Portable  Result Date: 10/27/2022 CLINICAL DATA:  [place] EXAM: PORTABLE PELVIS 1-2 VIEWS COMPARISON:  None Available. FINDINGS: Single frontal view of the pelvis excludes portions of the right hip by collimation. There is a mildly displaced intertrochanteric left hip fracture. No significant impaction or angulation. No dislocation. Remainder of the bony pelvis is unremarkable. IMPRESSION: 1. Minimally displaced intertrochanteric  left hip fracture. Electronically Signed   By: Randa Ngo M.D.   On: 10/27/2022 22:11   DG Chest Portable 1 View  Result Date: 10/27/2022 CLINICAL DATA:  [place], anticoagulated EXAM: PORTABLE CHEST 1 VIEW COMPARISON:  03/14/2021 FINDINGS: Single frontal view of the chest demonstrates an unremarkable cardiac silhouette. No airspace disease, effusion, or pneumothorax. No acute bony abnormalities. IMPRESSION: 1. No acute intrathoracic process. Electronically Signed   By: Randa Ngo M.D.   On: 10/27/2022 22:11   DG Wrist Complete Left  Result Date: 10/27/2022 CLINICAL DATA:  Fall, level 2 trauma. EXAM: LEFT WRIST - COMPLETE 3+ VIEW COMPARISON:  None Available. FINDINGS: There is no evidence of fracture or dislocation. Joint space narrowing and subchondral sclerosis at the first carpometacarpal joint. Osteopenia. Soft tissues are unremarkable. IMPRESSION: 1. No acute fracture or dislocation. 2. Severe degenerative changes at the first carpometacarpal joint. Electronically Signed   By: Keane Police D.O.   On: 10/27/2022 22:10        Scheduled Meds:  docusate sodium  100 mg Oral BID   feeding supplement  237 mL Oral BID BM   insulin aspart  0-6 Units Subcutaneous TID WC   multivitamin with minerals  1 tablet Oral Daily   Continuous Infusions:  ceFAZolin (ANCEF) IV 2 g (10/29/22 1632)     LOS: 1 day    Time spent: 35 minutes.    Dana Allan, MD  [facility] Pager #: [phone] 7PM-7AM contact night coverage as above

## 2022-10-29 NOTE — Anesthesia Postprocedure Evaluation (Signed)
Anesthesia Post Note  Patient: Christy Sutton  Procedure(s) Performed: INTRAMEDULLARY (IM) NAIL INTERTROCHANTERIC (Left)     Patient location during evaluation: PACU Anesthesia Type: General Level of consciousness: awake and alert Pain management: pain level controlled Vital Signs Assessment: post-procedure vital signs reviewed and stable Respiratory status: spontaneous breathing, nonlabored ventilation, respiratory function stable and patient connected to nasal cannula oxygen Cardiovascular status: blood pressure returned to baseline and stable Postop Assessment: no apparent nausea or vomiting Anesthetic complications: no  No notable events documented.  Last Vitals:  Vitals:   10/29/22 1000 10/29/22 1140  BP:  98/60  Pulse:  (!) 132  Resp:  18  Temp:  36.8 C  SpO2: 96% 100%    Last Pain:  Vitals:   10/29/22 1140  TempSrc: Oral  PainSc:                  [name]

## 2022-10-29 NOTE — Anesthesia Procedure Notes (Signed)
Procedure Name: Intubation Date/Time: 10/29/2022 7:51 AM  Performed by: Sammie Bench, CRNAPre-anesthesia Checklist: Patient identified, Emergency Drugs available, Suction available and Patient being monitored Patient Re-evaluated:Patient Re-evaluated prior to induction Oxygen Delivery Method: Circle System Utilized Preoxygenation: Pre-oxygenation with 100% oxygen Induction Type: IV induction Ventilation: Mask ventilation without difficulty Laryngoscope Size: Mac and 3 Grade View: Grade I Tube type: Oral Tube size: 7.0 mm Number of attempts: 1 Airway Equipment and Method: Stylet and Oral airway Placement Confirmation: ETT inserted through vocal cords under direct vision, positive ETCO2 and breath sounds checked- equal and bilateral Tube secured with: Tape Dental Injury: Teeth and Oropharynx as per pre-operative assessment

## 2022-10-29 NOTE — H&P (Signed)
H&P update  The surgical history has been reviewed and remains accurate without interval change.  The patient was re-examined and patient's physiologic condition has not changed significantly in the last 30 days. The condition still exists that makes this procedure necessary. The treatment plan remains the same, without new options for care.  No new pharmacological allergies or types of therapy has been initiated that would change the plan or the appropriateness of the plan.  The patient and/or family understand the potential benefits and risks.  Errika Narvaiz P. Stann Mainland, MD 10/29/2022 7:39 AM

## 2022-10-29 NOTE — Op Note (Signed)
Date of Surgery: 10/29/2022  INDICATIONS: Christy Sutton is a 89 y.o.-year-old female who sustained a left hip fracture. The risks and benefits of the procedure discussed with the patient prior to the procedure and all questions were answered; consent was obtained.  PREOPERATIVE DIAGNOSIS: left hip fracture   POSTOPERATIVE DIAGNOSIS: Same   PROCEDURE: Treatment of intertrochanteric, pertrochanteric, subtrochanteric fracture with intramedullary implant. CPT [phone]   SURGEON: Dannielle Karvonen. Stann Mainland, M.D.   ANESTHESIA: general   IV FLUIDS AND URINE: See anesthesia record   ESTIMATED BLOOD LOSS: 50 cc  IMPLANTS:  Synthes TFNA 10 x 170 mm 100 mm compression screw 36 mm distal interlock  DRAINS: None.   COMPLICATIONS: None.   DESCRIPTION OF PROCEDURE: The patient was brought to the operating room and placed supine on the operating table. The patient's leg had been signed prior to the procedure. The patient had the anesthesia placed by the anesthesiologist. The prep verification and incision time-outs were performed to confirm that this was the correct patient, site, side and location. The patient had an SCD on the opposite lower extremity. The patient did receive antibiotics prior to the incision and was re-dosed during the procedure as needed at indicated intervals. The patient was positioned on the fracture table with the table in traction and internal rotation to reduce the hip. The well leg was placed in a scissor position and all bony prominences were well-padded. The patient had the lower extremity prepped and draped in the standard surgical fashion. The incision was made 4 finger breadths superior to the greater trochanter. A guide pin was inserted into the tip of the greater trochanter under fluoroscopic guidance. An opening reamer was used to gain access to the femoral canal. The nail length was measured and inserted down the femoral canal to its proper depth. The appropriate version of  insertion for the lag screw was found under fluoroscopy. A pin was inserted up the femoral neck through the jig. The length of the lag screw was then measured. The lag screw was inserted as near to center-center in the head as possible. The leg was taken out of traction, then the compression screw was used to compress across the fracture. Compression was visualized on serial xrays.   We next turned our attention to the distal interlocking screw.  This was placed through the drill guide of the nail inserter.  A small incision was made overlying the lateral thigh at the screw site, and a tonsil was used to disect down to bone.  A drill pass was made through the jig and across the nail through both cortices.  This was measured, and the appropriate screw was placed under hand power and found to have good bite.    The wound was copiously irrigated with saline and the subcutaneous layer closed with 2.0 monocryl and the skin was reapproximated with staples. The wounds were cleaned and dried a final time and a sterile dressing was placed. The hip was taken through a range of motion at the end of the case under fluoroscopic imaging to visualize the approach-withdraw phenomenon and confirm implant length in the head. The patient was then awakened from anesthesia and taken to the recovery room in stable condition. All counts were correct at the end of the case.   POSTOPERATIVE PLAN: The patient will be weight bearing as tolerated and will return in 2 weeks for staple removal and the patient will receive DVT prophylaxis based on other medications, activity level, and risk ratio  of bleeding to thrombosis.    She may begin chronic anticoagulant meds tomorrow am.   Geralynn Rile, MD [facility] [phone] 8:48 AM

## 2022-10-29 NOTE — Progress Notes (Signed)
Pt HR 137-145 P132 Temp 98.3 Bp 98/60 map 72. Notified Ogbata, Sylvester L by [employer] No new order given HR decreased to 109-117 recommendation from MD was to keep monitoring.

## 2022-10-29 NOTE — Brief Op Note (Signed)
10/27/2022 - 10/29/2022  8:47 AM  PATIENT:  Christy Sutton  89 y.o. female  PRE-OPERATIVE DIAGNOSIS:  Left Hip fx  POST-OPERATIVE DIAGNOSIS:  Left Hip fx  PROCEDURE:  Procedure(s): INTRAMEDULLARY (IM) NAIL INTERTROCHANTERIC (Left)  SURGEON:  Surgeon(s) and Role:    * Nicholes Stairs, MD - Primary  PHYSICIAN ASSISTANT: none  ANESTHESIA:   general  EBL:  50 cc   BLOOD ADMINISTERED:none  DRAINS: none   LOCAL MEDICATIONS USED:  NONE  SPECIMEN:  No Specimen  DISPOSITION OF SPECIMEN:  N/A  COUNTS:  YES  TOURNIQUET:  * No tourniquets in log *  DICTATION: .Note written in EPIC  PLAN OF CARE: Admit to inpatient   PATIENT DISPOSITION:  PACU - hemodynamically stable.   Delay start of Pharmacological VTE agent (>24hrs) due to surgical blood loss or risk of bleeding: not applicable

## 2022-10-29 NOTE — Discharge Instructions (Signed)
Orthopedic surgery discharge instructions:  Okay to bear full weight on left lower extremity as tolerated.  You may begin showering postoperative day #1.  Please do not submerge your incisions underwater.  You will resume your preoperative anticoagulation and this will serve as your prevention of blood clots.  Maintain postoperative bandages until your follow-up appointment.  Apply ice to the left hip around-the-clock 20 to 30 minutes each time as able.  For pain use Tylenol and ice as necessary.  Will follow-up with Dr. Stann Mainland at [facility] office in 2 weeks from discharge.

## 2022-10-29 NOTE — Progress Notes (Signed)
Report given to PACU nurse.

## 2022-10-29 NOTE — Progress Notes (Signed)
Patient transported to PACU for surgery. Patient is har d at hearing but more alert this A.M. and able to answer questions. Vital signs are stable, blood glucose checked prior to transport to PACU.

## 2022-10-30 DIAGNOSIS — S72142A Displaced intertrochanteric fracture of left femur, initial encounter for closed fracture: Secondary | ICD-10-CM | POA: Diagnosis not present

## 2022-10-30 LAB — CBC
HCT: 23 % — ABNORMAL LOW (ref 36.0–46.0)
Hemoglobin: 7.5 g/dL — ABNORMAL LOW (ref 12.0–15.0)
MCH: 30.5 pg (ref 26.0–34.0)
MCHC: 32.6 g/dL (ref 30.0–36.0)
MCV: 93.5 fL (ref 80.0–100.0)
Platelets: 148 10*3/uL — ABNORMAL LOW (ref 150–400)
RBC: 2.46 MIL/uL — ABNORMAL LOW (ref 3.87–5.11)
RDW: 13.2 % (ref 11.5–15.5)
WBC: 9.4 10*3/uL (ref 4.0–10.5)
nRBC: 0 % (ref 0.0–0.2)

## 2022-10-30 LAB — GLUCOSE, CAPILLARY
Glucose-Capillary: 108 mg/dL — ABNORMAL HIGH (ref 70–99)
Glucose-Capillary: 126 mg/dL — ABNORMAL HIGH (ref 70–99)
Glucose-Capillary: 132 mg/dL — ABNORMAL HIGH (ref 70–99)
Glucose-Capillary: 143 mg/dL — ABNORMAL HIGH (ref 70–99)

## 2022-10-30 LAB — BASIC METABOLIC PANEL
Anion gap: 9 (ref 5–15)
BUN: 22 mg/dL (ref 8–23)
CO2: 23 mmol/L (ref 22–32)
Calcium: 8.1 mg/dL — ABNORMAL LOW (ref 8.9–10.3)
Chloride: 105 mmol/L (ref 98–111)
Creatinine, Ser: 0.93 mg/dL (ref 0.44–1.00)
GFR, Estimated: 57 mL/min — ABNORMAL LOW (ref >60)
Glucose, Bld: 131 mg/dL — ABNORMAL HIGH (ref 70–99)
Potassium: 3.9 mmol/L (ref 3.5–5.1)
Sodium: 137 mmol/L (ref 135–145)

## 2022-10-30 NOTE — Progress Notes (Signed)
PROGRESS NOTE  Christy Sutton  DOB: 1925/12/08  PCP: Donnajean Lopes, MD [id number]  DOA: 10/27/2022  LOS: 2 days  Hospital Day: 4  Brief narrative: Christy Sutton is a 89 y.o. female with PMH significant for DM2, HTN, HLD, A-fib, stroke with residual left-sided weakness 2/16, patient presented to the ED after a fall. Imaging showed left intertrochanteric fracture Admitted to [facility] 2/18, underwent intramedullary nailing of the fracture.  Subjective: Patient was seen and examined this morning.  Pleasant elderly Caucasian female.  Alert, awake, disoriented.  Mild delirium.. Chart reviewed In the last 24 hours, remains afebrile, tachycardia improved.  Blood pressure in low 100s Last set of labs this morning with hemoglobin low at 7.5  Assessment and plan: Closed intertrochanteric fracture of left femur S/p IM nailing 2/18 Pain control and DVT prophylaxis per orthopedics.   PTA on Eliquis and Plavix which were held for surgery.  Acute blood loss anemia Baseline hemoglobin normal at 12.2.  Noted drop in hemoglobin down to 7.5 this morning.  Secondary to long bone fracture as well as surgical blood loss. Repeat hemoglobin in the morning.  Transfuse if less than 7. Recent Labs    10/27/22 2200 10/28/22 0341 10/30/22 0458  HGB 12.2 11.7* 7.5*  MCV 93.0 93.7 93.5   Atrial fibrillation, chronic Not on any AV nodal blocking agent.   Previously on Eliquis which is currently on hold.  Will resume once hemoglobin stabilizes.   History of CVA with residual left-sided weakness HLD Continue simvastatin.   Holding Eliquis and Plavix.    Essential hypertension PTA on Lasix 20 mg twice daily.  Currently on hold   Type 2 diabetes mellitus A1c 5.7 on 10/28/2022 Not on antidiabetic meds Currently on sliding scale insulin with Accu-Cheks. Recent Labs  Lab 10/29/22 0629 10/29/22 1157 10/29/22 1617 10/29/22 2105 10/30/22 0756  GLUCAP 118* 161* 184* 193* 108*   Mobility: PT eval  pending  Goals of care   Code Status: DNR     DVT prophylaxis:  SCDs Start: 10/29/22 1032 SCDs Start: 10/28/22 0114   Antimicrobials: None Fluid: None Consultants: Orthopedics Family Communication: None at bedside  Status is: Inpatient Level of care: Telemetry Medical   Dispo: Patient is from: Home              Anticipated d/c is to: Likely SNF Continue in-hospital care because: Pending PT eval   Scheduled Meds:  Chlorhexidine Gluconate Cloth  6 each Topical Q0600   docusate sodium  100 mg Oral BID   feeding supplement  237 mL Oral BID BM   insulin aspart  0-6 Units Subcutaneous TID WC   multivitamin with minerals  1 tablet Oral Daily   mupirocin ointment  1 Application Nasal BID    PRN meds: HYDROcodone-acetaminophen, menthol-cetylpyridinium **OR** phenol, metoCLOPramide **OR** metoCLOPramide (REGLAN) injection, morphine injection, ondansetron **OR** ondansetron (ZOFRAN) IV, senna-docusate   Infusions:    Diet:  Diet Order             Diet regular Room service appropriate? Yes; Fluid consistency: Thin  Diet effective now                   Antimicrobials: Anti-infectives (From admission, onward)    Start     Dose/Rate Route Frequency Ordered Stop   10/29/22 1600  ceFAZolin (ANCEF) IVPB 2g/100 mL premix        2 g 200 mL/hr over 30 Minutes Intravenous Every 8 hours 10/29/22 1031 10/30/22 0046   10/29/22 0700  ceFAZolin (ANCEF) IVPB 2g/100 mL premix        2 g 200 mL/hr over 30 Minutes Intravenous To ShortStay Surgical 10/28/22 1959 10/29/22 0755   10/29/22 0625  ceFAZolin (ANCEF) 2-4 GM/100ML-% IVPB       Note to Pharmacy: Nyoka Cowden D: cabinet override      10/29/22 0625 10/29/22 0802       Skin assessment:  Pressure Injury 10/28/22 Sacrum (Active)  10/28/22 0200  Location: Sacrum  Location Orientation:   Staging:   Wound Description (Comments):   Present on Admission:       Nutritional status:  Body mass index is 23.92 kg/m.   Nutrition Problem: Increased nutrient needs Etiology: hip fracture Signs/Symptoms: estimated needs     Objective: Vitals:   10/29/22 2056 10/30/22 0755  BP: (!) 112/102 (!) 100/40  Pulse: 91 77  Resp: 17   Temp: 98.4 F (36.9 C) 97.7 F (36.5 C)  SpO2: 99% 90%    Intake/Output Summary (Last 24 hours) at 10/30/2022 1020 Last data filed at 10/29/2022 2050 Gross per 24 hour  Intake 120 ml  Output --  Net 120 ml   Filed Weights   10/27/22 2201 10/29/22 0653  Weight: 61.2 kg 61.2 kg   Weight change:  Body mass index is 23.92 kg/m.   Physical Exam: General exam: Pleasant, elderly Caucasian female. Skin: No rashes, lesions or ulcers. HEENT: Atraumatic, normocephalic, no obvious bleeding Lungs: Clear to auscultation bilaterally CVS: Mild tachycardia present, no murmur  GI/Abd soft, nontender, nondistended, bowel sound present CNS: Alert, awake, oriented to place only Psychiatry: Cheerfully demented.  Not aggressive, not restless at the time of my evaluation Extremities: No pedal edema, no calf tenderness  Data Review: I have personally reviewed the laboratory data and studies available.  F/u labs ordered Unresulted Labs (From admission, onward)     Start     Ordered   10/30/22 0500  CBC  Daily,   R     Comments: For 3 days.   Question:  Specimen collection method  Answer:  Lab=Lab collect   10/29/22 1031   10/30/22 [id number]  Basic metabolic panel  Daily,   R     Comments: For 2 days .   Question:  Specimen collection method  Answer:  Lab=Lab collect   10/29/22 1031            Total time spent in review of labs and imaging, patient evaluation, formulation of plan, documentation and communication with family: 42 minutes  Signed, Terrilee Croak, MD [facility] 10/30/2022

## 2022-10-30 NOTE — Evaluation (Signed)
Physical Therapy Evaluation Patient Details Name: Christy Sutton MRN: [id number] DOB: Feb 16, 1926 Today's Date: 10/30/2022  History of Present Illness  Admitted after ground-level fall resulting in L hip fx, now post surgical fixation, WBAT;  has a past medical history of Atrial fibrillation ([place]), Diabetes mellitus without complication ([place]), Hyperlipidemia, Hypertension, Stroke ([place]), and TIA (transient ischemic attack).  Clinical Impression   Pt admitted with above diagnosis. Lives at home with grandson (and reports she has good family support), in a single-level home with some steps to enter; Prior to admission, pt was able to transfer to wheelchair and walk short distances with RW and close standby assist; sponge bathes; Presents to PT with L hip pain, decr activity tolerance with BP drop in sitting, functional dependencies;  2 person assist to come to sit EOB; BP drop in sitting EOB, so opted to help pt back to supine; pt initailly quite hesitant to move today, but once laying back down, she pronounced it as "not so bad", and seemed pleased with herself; Plans for her to receive blood, and I'm hopeful her activity tolerance with be much improved; with WBAT status of her L hip, she shows good rehab potential; would like to consider AIR stay for post-acute rehab; Pt currently with functional limitations due to the deficits listed below (see PT Problem List). Pt will benefit from skilled PT to increase their independence and safety with mobility to allow discharge to the venue listed below.          Recommendations for follow up therapy are one component of a multi-disciplinary discharge planning process, led by the attending physician.  Recommendations may be updated based on patient status, additional functional criteria and insurance authorization.  Follow Up Recommendations Acute inpatient rehab (3hours/day) (Will need more info re: available family assist)      Assistance Recommended at  Discharge Frequent or constant Supervision/Assistance  Patient can return home with the following  A lot of help with walking and/or transfers;A lot of help with bathing/dressing/bathroom;Help with stairs or ramp for entrance;Assist for transportation;Assistance with cooking/housework    Equipment Recommendations Rolling walker (2 wheels);Wheelchair (measurements PT);Wheelchair cushion (measurements PT);BSC/3in1  Recommendations for Other Services  Rehab consult (would liek to consider AIR)    Functional Status Assessment Patient has had a recent decline in their functional status and demonstrates the ability to make significant improvements in function in a reasonable and predictable amount of time.     Precautions / Restrictions Precautions Precautions: Fall Precaution Comments: monitor BP Restrictions Weight Bearing Restrictions: No LLE Weight Bearing: Weight bearing as tolerated      Mobility  Bed Mobility Overal bed mobility: Needs Assistance Bed Mobility: Supine to Sit, Sit to Supine     Supine to sit: Max assist, +2 for safety/equipment Sit to supine: Max assist, +2 for safety/equipment   General bed mobility comments: Overall 2 person Max assist for smooth transition up to sitting and then back down to supine; the smoother transition helped with pt tolerance of movement and participation    Transfers                   General transfer comment: Held on transfers due to BP drop in sitting    Ambulation/Gait                  Stairs            Wheelchair Mobility    Modified Rankin (Stroke Patients Only)  Balance Overall balance assessment: Needs assistance   Sitting balance-Leahy Scale: Fair Sitting balance - Comments: Sat EOB for approx 5 minutes, progresssed from mod assist for sitting balance to minguard; back to supine due to decr BP in sitting                                     Pertinent Vitals/Pain Pain  Assessment Pain Assessment: Faces Faces Pain Scale: Hurts little more Pain Location: L hip with getting up Pain Descriptors / Indicators: Grimacing, Guarding Pain Intervention(s): Monitored during session, Limited activity within patient's tolerance    Home Living Family/patient expects to be discharged to:: Private residence Living Arrangements: Other relatives Biomedical scientist) Available Help at Discharge: Family;Available 24 hours/day Type of Home: House Home Access: Stairs to enter Entrance Stairs-Rails: Right Entrance Stairs-Number of Steps: 6   Home Layout: One level Home Equipment: Conservation officer, nature (2 wheels);Wheelchair - manual Additional Comments: Above mostly gleaned from chart review; will need to be verified    Prior Function Prior Level of Function : Needs assist             Mobility Comments: walks with RW short distances; reports always has a family member beside ehr when she walks ADLs Comments: Sounds like pt sponge bathes     Hand Dominance   Dominant Hand: Right    Extremity/Trunk Assessment   Upper Extremity Assessment Upper Extremity Assessment: Generalized weakness    Lower Extremity Assessment Lower Extremity Assessment: Generalized weakness;LLE deficits/detail LLE Deficits / Details: Grossly decr aROM adn strength, limited by pain postop       Communication   Communication: HOH (extremely)  Cognition Arousal/Alertness: Awake/alert Behavior During Therapy: WFL for tasks assessed/performed Overall Cognitive Status: No family/caregiver present to determine baseline cognitive functioning                                 General Comments: Took some gentle encouragement to get participation; but she seemed pleasantly surprised with amount of pain and how she felt sitting up at end of session        General Comments General comments (skin integrity, edema, etc.):   10/30/22 1315 10/30/22 1316  Vital Signs  Patient Position (if  appropriate) Orthostatic Vitals  --   Orthostatic Lying   BP- Lying 105/74 (!) 136/91  Pulse- Lying 118 129  Orthostatic Sitting  BP- Sitting (!) 73/59  --   Pulse- Sitting 119  --        Exercises     Assessment/Plan    PT Assessment Patient needs continued PT services  PT Problem List Decreased strength;Decreased range of motion;Decreased activity tolerance;Decreased balance;Decreased mobility;Decreased coordination;Decreased knowledge of use of DME;Decreased safety awareness;Decreased knowledge of precautions;Cardiopulmonary status limiting activity;Pain       PT Treatment Interventions DME instruction;Gait training;Functional mobility training;Therapeutic activities;Therapeutic exercise;Balance training;Neuromuscular re-education;Cognitive remediation;Patient/family education    PT Goals (Current goals can be found in the Care Plan section)  Acute Rehab PT Goals Patient Stated Goal: Hopes to be able to get home PT Goal Formulation: Patient unable to participate in goal setting Time For Goal Achievement: 11/13/22 Potential to Achieve Goals: Good    Frequency Min 4X/week     Co-evaluation               AM-PAC PT "6 Clicks" Mobility  Outcome Measure Help needed turning from  your back to your side while in a flat bed without using bedrails?: A Lot Help needed moving from lying on your back to sitting on the side of a flat bed without using bedrails?: Total Help needed moving to and from a bed to a chair (including a wheelchair)?: Total Help needed standing up from a chair using your arms (e.g., wheelchair or bedside chair)?: Total Help needed to walk in hospital room?: Total Help needed climbing 3-5 steps with a railing? : Total 6 Click Score: 7    End of Session Equipment Utilized During Treatment: Other (comment) (bed pads) Activity Tolerance: Patient tolerated treatment well;Other (comment) (Overall with good tolerance of motion, but BP drop in  sitting) Patient left: in bed;with call bell/phone within reach;with bed alarm set (bed in semi-chair position) Nurse Communication: Mobility status (supine and sitting BPs) PT Visit Diagnosis: Unsteadiness on feet (R26.81);Other abnormalities of gait and mobility (R26.89);History of falling (Z91.81);Pain Pain - Right/Left: Left Pain - part of body: Hip    Time: 1311-1336 PT Time Calculation (min) (ACUTE ONLY): 25 min   Charges:   PT Evaluation $PT Eval Moderate Complexity: 1 Mod PT Treatments $Therapeutic Activity: 8-22 mins        Roney Marion, PT  Acute Rehabilitation Services Office [phone]   Colletta [place] 10/30/2022, 6:14 PM

## 2022-10-30 NOTE — Plan of Care (Signed)
  Problem: Coping: Goal: Ability to adjust to condition or change in health will improve Outcome: Progressing   Problem: Fluid Volume: Goal: Ability to maintain a balanced intake and output will improve Outcome: Progressing   Problem: Health Behavior/Discharge Planning: Goal: Ability to identify and utilize available resources and services will improve Outcome: Progressing   Problem: Metabolic: Goal: Ability to maintain appropriate glucose levels will improve Outcome: Progressing   Problem: Nutritional: Goal: Maintenance of adequate nutrition will improve Outcome: Progressing Goal: Progress toward achieving an optimal weight will improve Outcome: Progressing   Problem: Skin Integrity: Goal: Risk for impaired skin integrity will decrease Outcome: Progressing   Problem: Tissue Perfusion: Goal: Adequacy of tissue perfusion will improve Outcome: Progressing

## 2022-10-30 NOTE — TOC CAGE-AID Note (Signed)
Transition of Care [facility]) - CAGE-AID Screening   Patient Details  Name: Christy Sutton MRN: [id number] Date of Birth: August 30, 1926  Transition of Care [facility]) CM/SW Contact:    Clovis Cao, RN Phone Number: [phone] 10/30/2022, 12:53 PM   Clinical Narrative: Pt denies alcohol or drug use.  Screening complete.   CAGE-AID Screening:    Have You Ever Felt You Ought to Cut Down on Your Drinking or Drug Use?: No Have People Annoyed You By Critizing Your Drinking Or Drug Use?: No Have You Felt Bad Or Guilty About Your Drinking Or Drug Use?: No Have You Ever Had a Drink or Used Drugs First Thing In The Morning to Steady Your Nerves or to Get Rid of a Hangover?: No CAGE-AID Score: 0  Substance Abuse Education Offered: No

## 2022-10-31 ENCOUNTER — Encounter (HOSPITAL_COMMUNITY): Payer: Self-pay | Admitting: Orthopedic Surgery

## 2022-10-31 DIAGNOSIS — S72142A Displaced intertrochanteric fracture of left femur, initial encounter for closed fracture: Secondary | ICD-10-CM | POA: Diagnosis not present

## 2022-10-31 LAB — CBC
HCT: 20.8 % — ABNORMAL LOW (ref 36.0–46.0)
Hemoglobin: 7.1 g/dL — ABNORMAL LOW (ref 12.0–15.0)
MCH: 31.4 pg (ref 26.0–34.0)
MCHC: 34.1 g/dL (ref 30.0–36.0)
MCV: 92 fL (ref 80.0–100.0)
Platelets: 146 10*3/uL — ABNORMAL LOW (ref 150–400)
RBC: 2.26 MIL/uL — ABNORMAL LOW (ref 3.87–5.11)
RDW: 13.2 % (ref 11.5–15.5)
WBC: 7.7 10*3/uL (ref 4.0–10.5)
nRBC: 0 % (ref 0.0–0.2)

## 2022-10-31 LAB — GLUCOSE, CAPILLARY
Glucose-Capillary: 112 mg/dL — ABNORMAL HIGH (ref 70–99)
Glucose-Capillary: 106 mg/dL — ABNORMAL HIGH (ref 70–99)
Glucose-Capillary: 127 mg/dL — ABNORMAL HIGH (ref 70–99)
Glucose-Capillary: 114 mg/dL — ABNORMAL HIGH (ref 70–99)

## 2022-10-31 LAB — BASIC METABOLIC PANEL
Anion gap: 8 (ref 5–15)
BUN: 17 mg/dL (ref 8–23)
CO2: 24 mmol/L (ref 22–32)
Calcium: 8 mg/dL — ABNORMAL LOW (ref 8.9–10.3)
Chloride: 105 mmol/L (ref 98–111)
Creatinine, Ser: 0.75 mg/dL (ref 0.44–1.00)
GFR, Estimated: >60 mL/min (ref >60)
Glucose, Bld: 116 mg/dL — ABNORMAL HIGH (ref 70–99)
Potassium: 3.9 mmol/L (ref 3.5–5.1)
Sodium: 137 mmol/L (ref 135–145)

## 2022-10-31 LAB — PREPARE RBC (CROSSMATCH)

## 2022-10-31 MED ORDER — DOCUSATE SODIUM 50 MG/5ML PO LIQD
100.0000 mg | Freq: Two times a day (BID) | ORAL | Status: DC
  Administered 2022-10-31 – 2022-11-07 (×15): 100 mg via ORAL
  Filled 2022-10-31 (×16): qty 10

## 2022-10-31 MED ORDER — TRAMADOL HCL 50 MG PO TABS: 50.0000 mg | ORAL_TABLET | Freq: Four times a day (QID) | ORAL | 0 refills | Status: AC | PRN

## 2022-10-31 MED ORDER — SODIUM CHLORIDE 0.9% IV SOLUTION: Freq: Once | INTRAVENOUS | Status: DC

## 2022-10-31 NOTE — Progress Notes (Signed)
PROGRESS NOTE  Christy Sutton  DOB: May 04, 1926  PCP: Donnajean Lopes, MD [id number]  DOA: 10/27/2022  LOS: 3 days  Hospital Day: 5  Brief narrative: Christy Sutton is a 89 y.o. female with PMH significant for DM2, HTN, HLD, A-fib, stroke with residual left-sided weakness 2/16, patient presented to the ED after a fall. Imaging showed left intertrochanteric fracture Admitted to [facility] 2/18, underwent intramedullary nailing of the fracture.  Subjective: Patient was seen and examined this morning.  Lying on bed.  Not in distress.  No new symptoms. Chart reviewed.  Patient remains tachycardic in low 100s Blood pressure normal Labs from this morning with hemoglobin down to 7.1.  Assessment and plan: Closed intertrochanteric fracture of left femur S/p IM nailing 2/18 Pain control and DVT prophylaxis per orthopedics.   PTA on Eliquis and Plavix which were held for surgery.  Acute blood loss anemia Baseline hemoglobin normal at 12.2.  Noted drop in hemoglobin down to 7.1 this morning.  Secondary to long bone fracture as well as surgical blood loss.  I called and discussed this with patient's son and daughter-in-law today.  They initially wanted to donate blood by themselves.  I checked with blood bank.  Blood bank stated that it is not possible for family to donate for immediate use as an inpatient because it takes about 7 to 10 days for all the screening process to complete.  I called the family again.  They are agreeable to blood transfusion from blood bank at this time. I would continue to hold Eliquis and Plavix for now. Recent Labs    10/27/22 2200 10/28/22 0341 10/30/22 0458 10/31/22 0234  HGB 12.2 11.7* 7.5* 7.1*  MCV 93.0 93.7 93.5 92.0   Atrial fibrillation, chronic Not on any AV nodal blocking agent.   Currently remains mildly tachycardic probably due to anemia.  Continue to monitor in telemetry. PTA on Eliquis. Plan as above.    History of CVA with residual left-sided  weakness HLD Continue simvastatin.   PTA on Eliquis and Plavix.  Plan as above  Essential hypertension PTA on Lasix 20 mg twice daily.  Currently on hold   Type 2 diabetes mellitus A1c 5.7 on 10/28/2022 Not on antidiabetic meds Currently on sliding scale insulin with Accu-Cheks. Recent Labs  Lab 10/30/22 1117 10/30/22 1537 10/30/22 2035 10/31/22 0722 10/31/22 1134  GLUCAP 126* 132* 143* 112* 106*   Mobility: PT eval obtained.  CIR recommended  Goals of care   Code Status: DNR     DVT prophylaxis:  SCDs Start: 10/29/22 1032 SCDs Start: 10/28/22 0114   Antimicrobials: None Fluid: None Consultants: Orthopedics Family Communication: Son and daughter-in-law on the phone.  Status is: Inpatient Level of care: Telemetry Medical   Dispo: Patient is from: Home              Anticipated d/c is to: Likely SNF Continue in-hospital care because: Pending PT eval   Scheduled Meds:  sodium chloride   Intravenous Once   Chlorhexidine Gluconate Cloth  6 each Topical Q0600   docusate  100 mg Oral BID   feeding supplement  237 mL Oral BID BM   insulin aspart  0-6 Units Subcutaneous TID WC   multivitamin with minerals  1 tablet Oral Daily   mupirocin ointment  1 Application Nasal BID    PRN meds: HYDROcodone-acetaminophen, menthol-cetylpyridinium **OR** phenol, metoCLOPramide **OR** metoCLOPramide (REGLAN) injection, morphine injection, ondansetron **OR** ondansetron (ZOFRAN) IV, senna-docusate   Infusions:    Diet:  Diet  Order             Diet regular Room service appropriate? Yes; Fluid consistency: Thin  Diet effective now                   Antimicrobials: Anti-infectives (From admission, onward)    Start     Dose/Rate Route Frequency Ordered Stop   10/29/22 1600  ceFAZolin (ANCEF) IVPB 2g/100 mL premix        2 g 200 mL/hr over 30 Minutes Intravenous Every 8 hours 10/29/22 1031 10/30/22 0046   10/29/22 0700  ceFAZolin (ANCEF) IVPB 2g/100 mL premix         2 g 200 mL/hr over 30 Minutes Intravenous To ShortStay Surgical 10/28/22 1959 10/29/22 0755   10/29/22 0625  ceFAZolin (ANCEF) 2-4 GM/100ML-% IVPB       Note to Pharmacy: Nyoka Cowden D: cabinet override      10/29/22 0625 10/29/22 0802       Skin assessment:  Pressure Injury 10/28/22 Sacrum (Active)  10/28/22 0200  Location: Sacrum  Location Orientation:   Staging:   Wound Description (Comments):   Present on Admission:       Nutritional status:  Body mass index is 23.92 kg/m.  Nutrition Problem: Increased nutrient needs Etiology: hip fracture Signs/Symptoms: estimated needs     Objective: Vitals:   10/31/22 0424 10/31/22 0724  BP: (!) 124/42 114/74  Pulse: (!) 110   Resp: 20   Temp: 98.3 F (36.8 C) 98.5 F (36.9 C)  SpO2: 97%     Intake/Output Summary (Last 24 hours) at 10/31/2022 1300 Last data filed at 10/30/2022 2300 Gross per 24 hour  Intake --  Output 300 ml  Net -300 ml   Filed Weights   10/27/22 2201 10/29/22 0653  Weight: 61.2 kg 61.2 kg   Weight change:  Body mass index is 23.92 kg/m.   Physical Exam: General exam: Pleasant, elderly Caucasian female. Skin: No rashes, lesions or ulcers. HEENT: Atraumatic, normocephalic, no obvious bleeding Lungs: Clear to auscultation bilaterally CVS: Mild tachycardia present, no murmur  GI/Abd soft, nontender, nondistended, bowel sound present CNS: Alert, awake, oriented to place only Psychiatry: Cheerfully demented.  Not aggressive, not restless at the time of my evaluation Extremities: No pedal edema, no calf tenderness  Data Review: I have personally reviewed the laboratory data and studies available.  F/u labs ordered Unresulted Labs (From admission, onward)     Start     Ordered   10/31/22 1255  Prepare RBC (crossmatch)  (Blood Administration Adult)  Once,   R       Question Answer Comment  # of Units 1 unit   Transfusion Indications Hemoglobin 8 gm/dL or less and orthopedic or cardiac  surgery or pre-existing cardiac condition   Number of Units to Keep Ahead NO units ahead   If emergent release call blood bank Not emergent release      10/31/22 1254   10/31/22 1253  Type and screen [place]  (Blood Administration Adult)  Once,   R       Comments: [place]    10/31/22 1254   10/30/22 0500  CBC  Daily,   R     Comments: For 3 days.   Question:  Specimen collection method  Answer:  Lab=Lab collect   10/29/22 1031            Total time spent in review of labs and imaging, patient evaluation, formulation  of plan, documentation and communication with family: 56 minutes  Signed, Terrilee Croak, MD [facility] 10/31/2022

## 2022-10-31 NOTE — Care Management Important Message (Signed)
Important Message  Patient Details  Name: Perlie Ohlmann MRN: [id number] Date of Birth: 06-May-1926   Medicare Important Message Given:  Yes     Hannah Beat 10/31/2022, 11:36 AM

## 2022-10-31 NOTE — Progress Notes (Signed)
   Inpatient Rehab Admissions Coordinator :  Per therapy recommendations, patient was screened for CIR candidacy by Danne Baxter RN MSN.  At this time patient appears to be a potential candidate for CIR. [employer] medicare may not approve for this diagnosis, but we will do full assessment.  I will place a rehab consult per protocol for full assessment. Please call me with any questions.  Danne Baxter RN MSN Admissions Coordinator [phone]

## 2022-10-31 NOTE — TOC Initial Note (Signed)
Transition of Care [facility]) - Initial/Assessment Note    Patient Details  Name: Christy Sutton MRN: [id number] Date of Birth: August 08, 1926  [facility] CM/SW Contact:    Sharin Mons, RN Phone Number: 10/31/2022, 8:25 AM  Clinical Narrative:                   S/p IMN , L hip, 2/28 NCM @ bedside to discuss d/c planning, pt sleepy/ lethargic.  NCM called and spoke with daughter regarding disposition needs. Per [name] (daughter) preference @ D/C : CIR per PT recommendations. [name] states family will provide 24/7 supervision needed once d/c. States pt has never been to a SNF and hoping PT can transition to CIR.  PTA independent with ADL's . Used a walker with ambulation. Adult grandson lives with pt,works during the day, home in the evening /hs.  Pt has to 2 steps to entry front door of home and has 5-6 steps from garage entrance.  Pt without transportation issues. No problems affording Rx meds.  TOC team following and will assist with needs....  Expected Discharge Plan: IP Rehab Facility Barriers to Discharge: Continued Medical Work up   Patient Goals and CMS Choice            Expected Discharge Plan and Services                                              Prior Living Arrangements/Services     Patient language and need for interpreter reviewed:: Yes        Need for Family Participation in Patient Care: Yes (Comment) Care giver support system in place?: Yes (comment)   Criminal Activity/Legal Involvement Pertinent to Current Situation/Hospitalization: No - Comment as needed  Activities of Daily Living      Permission Sought/Granted                  Emotional Assessment Appearance:: Appears stated age       Alcohol / Substance Use: Not Applicable Psych Involvement: No (comment)  Admission diagnosis:  Closed intertrochanteric fracture of left femur, initial encounter ([place]) [S72.142A] Closed fracture of left hip, initial  encounter ([place]) [S72.002A] Fall, initial encounter [W19.XXXA] Patient Active Problem List   Diagnosis Date Noted   Closed intertrochanteric fracture of left femur, initial encounter ([place]) 10/28/2022   Bilateral leg edema 04/20/2017   Atrial fibrillation, chronic ([place]) 04/20/2017   Left-sided weakness 04/20/2017   Hyperlipidemia associated with type 2 diabetes mellitus ([place]) 04/04/2017   Left hemiparesis ([place])    Acute blood loss anemia    Benign essential HTN    Hypokalemia    Hypoalbuminemia due to protein-calorie malnutrition ([place])    Stroke due to embolism ([place]) 03/09/2017   Diabetes mellitus type 2 in nonobese [facility])    History of CVA (cerebrovascular accident)    Cerebral embolism with cerebral infarction 03/07/2017   Stroke ([place]) 01/17/2017   TIA (transient ischemic attack) 01/17/2017   Dysmetria 01/17/2017   Diabetes mellitus with complication ([place]) 0000000   Ischemic stroke ([place])    PCP:  Donnajean Lopes, MD Pharmacy:   [facility] [address] [place]), [place] [id number] [place] ([place]) [place] [address] Phone: [phone] Fax: [phone]     Social Determinants of Health (SDOH) Social History: SDOH Screenings   Tobacco Use: Medium Risk (  10/29/2022)   SDOH Interventions:     Readmission Risk Interventions     No data to display

## 2022-10-31 NOTE — Progress Notes (Signed)
Physical Therapy Treatment Patient Details Name: Christy Sutton MRN: [id number] DOB: Jul 25, 1926 Today's Date: 10/31/2022   History of Present Illness Admitted after ground-level fall resulting in L hip fx, now post surgical fixation, WBAT;  has a past medical history of Atrial fibrillation ([place]), Diabetes mellitus without complication ([place]), Hyperlipidemia, Hypertension, Stroke ([place]), and TIA (transient ischemic attack).    PT Comments    Continuing work on functional mobility and activity tolerance;  Pt's BPs were still soft, but better tahn yesterday, and she was able to sit EOB and perform a basic pivot transfer bed to recliner on her R with 2 person assist; once up, she stated, "that wasn't so bad";  Discussed pt status, and rehab rec with pt's family, who agree with going to AIR post-acutely to maximize independence and safety with mobility and ADLs prior to dc home with family assist   Recommendations for follow up therapy are one component of a multi-disciplinary discharge planning process, led by the attending physician.  Recommendations may be updated based on patient status, additional functional criteria and insurance authorization.  Follow Up Recommendations  Acute inpatient rehab (3hours/day)     Assistance Recommended at Discharge Frequent or constant Supervision/Assistance  Patient can return home with the following A lot of help with walking and/or transfers;A lot of help with bathing/dressing/bathroom;Help with stairs or ramp for entrance;Assist for transportation;Assistance with cooking/housework   Equipment Recommendations  Rolling walker (2 wheels);Wheelchair (measurements PT);Wheelchair cushion (measurements PT);BSC/3in1    Recommendations for Other Services Rehab consult (would liek to consider AIR)     Precautions / Restrictions Precautions Precautions: Fall Precaution Comments: monitor BP Restrictions Weight Bearing Restrictions: No LLE Weight Bearing: Weight  bearing as tolerated     Mobility  Bed Mobility Overal bed mobility: Needs Assistance Bed Mobility: Supine to Sit, Sit to Supine     Supine to sit: Max assist, +2 for safety/equipment     General bed mobility comments: Overall 2 person Max assist for smooth transition up to sitting; the smoother transition helped with pt tolerance of movement and participation    Transfers Overall transfer level: Needs assistance Equipment used: Rolling walker (2 wheels) Transfers: Sit to/from Stand, Bed to chair/wheelchair/BSC Sit to Stand: Mod assist, +2 physical assistance Stand pivot transfers: Mod assist, +2 physical assistance         General transfer comment: Heavy mod assist to stand, and with posterior lean; Mod assist to maintain balance as pt took heel-toe pivot-like steps on RLE; took a small amount of weight on LLE; pivot transferred to the right    Ambulation/Gait                   Stairs             Wheelchair Mobility    Modified Rankin (Stroke Patients Only)       Balance Overall balance assessment: Needs assistance   Sitting balance-Leahy Scale: Fair                                      Cognition Arousal/Alertness: Awake/alert Behavior During Therapy: WFL for tasks assessed/performed Overall Cognitive Status: No family/caregiver present to determine baseline cognitive functioning                                 General Comments: Took some gentle encouragement to get  participation; but she seemed pleasantly surprised with amount of pain and how she felt sitting up at end of session        Exercises      General Comments General comments (skin integrity, edema, etc.): BP sitting EOB 90/60, MAP 71; HR 119; no dizziness reported      Pertinent Vitals/Pain Pain Assessment Faces Pain Scale: Hurts little more Pain Location: L hip with getting up Pain Descriptors / Indicators: Grimacing, Guarding    Home Living                           Prior Function            PT Goals (current goals can now be found in the care plan section) Acute Rehab PT Goals Patient Stated Goal: Hopes to be able to get home PT Goal Formulation: Patient unable to participate in goal setting Time For Goal Achievement: 11/13/22 Potential to Achieve Goals: Good Progress towards PT goals: Progressing toward goals    Frequency    Min 4X/week      PT Plan Current plan remains appropriate    Co-evaluation              AM-PAC PT "6 Clicks" Mobility   Outcome Measure  Help needed turning from your back to your side while in a flat bed without using bedrails?: A Lot Help needed moving from lying on your back to sitting on the side of a flat bed without using bedrails?: Total Help needed moving to and from a bed to a chair (including a wheelchair)?: Total Help needed standing up from a chair using your arms (e.g., wheelchair or bedside chair)?: Total Help needed to walk in hospital room?: Total Help needed climbing 3-5 steps with a railing? : Total 6 Click Score: 7    End of Session Equipment Utilized During Treatment: Other (comment);Gait belt (bed pads) Activity Tolerance: Patient tolerated treatment well Patient left: with call bell/phone within reach;in chair;with family/visitor present Nurse Communication: Mobility status (supine and sitting BPs) PT Visit Diagnosis: Unsteadiness on feet (R26.81);Other abnormalities of gait and mobility (R26.89);History of falling (Z91.81);Pain Pain - Right/Left: Left Pain - part of body: Hip     Time: 1445-1501 PT Time Calculation (min) (ACUTE ONLY): 16 min  Charges:  $Therapeutic Activity: 8-22 mins                     Roney [place], PT  Acute Rehabilitation Services Office [phone]    Christy Sutton 10/31/2022, 4:59 PM

## 2022-10-31 NOTE — Progress Notes (Signed)
   Subjective:  Christy Sutton is a 89 y.o. female, 2 Days Post-Op   s/p Procedure(s): INTRAMEDULLARY (IM) NAIL INTERTROCHANTERIC   Patient reports pain as mild.  Reports mild soreness of hip. Very hard of hearing. Doing overall ok.   Objective:   VITALS:   Vitals:   10/30/22 1507 10/30/22 2002 10/31/22 0424 10/31/22 0724  BP: (!) 126/57 (!) 119/58 (!) 124/42 114/74  Pulse: (!) 105  (!) 110   Resp:   20   Temp: 98.4 F (36.9 C) 98.4 F (36.9 C) 98.3 F (36.8 C) 98.5 F (36.9 C)  TempSrc: Oral Axillary Oral   SpO2: 97%  97%   Weight:      Height:       No Acute distress, laying in hospital bed   Left lower extremity: Dressing clean, with small amount of bloody drainage at aquacell x2, intact the left hip.  Able to wiggle toes, ankle.  Neurovascular intact distally.  Calf soft bilaterally.  Lab Results  Component Value Date   WBC 7.7 10/31/2022   HGB 7.1 (L) 10/31/2022   HCT 20.8 (L) 10/31/2022   MCV 92.0 10/31/2022   PLT 146 (L) 10/31/2022   BMET    Component Value Date/Time   NA 137 10/31/2022 0234   K 3.9 10/31/2022 0234   CL 105 10/31/2022 0234   CO2 24 10/31/2022 0234   GLUCOSE 116 (H) 10/31/2022 0234   BUN 17 10/31/2022 0234   CREATININE 0.75 10/31/2022 0234   CALCIUM 8.0 (L) 10/31/2022 0234   GFRNONAA >60 10/31/2022 0234     Assessment/Plan: 2 Days Post-Op   Principal Problem:   Closed intertrochanteric fracture of left femur, initial encounter ([place]) Active Problems:   Diabetes mellitus type 2 in nonobese ([place])   History of CVA (cerebrovascular accident)   Hyperlipidemia associated with type 2 diabetes mellitus ([place])   Atrial fibrillation, chronic ([place])   Advance diet Up with therapy  Dispo: Per PT, family preferences CIR is being investigated.  If not approved likely SNF pending progress with PT while inpatient.  Okay for discharge from Ortho perspective.  Weightbearing Status: Weightbearing as tolerated DVT Prophylaxis: Back on baseline  Eliquis and Plavix once hemoglobin stabilizes  Acute blood loss anemia-hemoglobin 7.1.  Continue to monitor.  Follow-up at [facility] in 2 weeks for x-rays, staple removal.  Aquacell dressings can be changed as need if saturated.   Tramadol Rx printed for pain if going to SNF  Faythe Casa 10/31/2022, 12:03 PM  Jonelle Sidle PA-C  Physician Assistant with Dr. Rogers, [place]

## 2022-11-01 DIAGNOSIS — Z8673 Personal history of transient ischemic attack (TIA), and cerebral infarction without residual deficits: Secondary | ICD-10-CM

## 2022-11-01 DIAGNOSIS — E119 Type 2 diabetes mellitus without complications: Secondary | ICD-10-CM

## 2022-11-01 DIAGNOSIS — W19XXXA Unspecified fall, initial encounter: Secondary | ICD-10-CM

## 2022-11-01 DIAGNOSIS — I482 Chronic atrial fibrillation, unspecified: Secondary | ICD-10-CM

## 2022-11-01 DIAGNOSIS — S72142A Displaced intertrochanteric fracture of left femur, initial encounter for closed fracture: Secondary | ICD-10-CM | POA: Diagnosis not present

## 2022-11-01 LAB — CBC
HCT: 26.5 % — ABNORMAL LOW (ref 36.0–46.0)
Hemoglobin: 9.2 g/dL — ABNORMAL LOW (ref 12.0–15.0)
MCH: 30.5 pg (ref 26.0–34.0)
MCHC: 34.7 g/dL (ref 30.0–36.0)
MCV: 87.7 fL (ref 80.0–100.0)
Platelets: 156 10*3/uL (ref 150–400)
RBC: 3.02 MIL/uL — ABNORMAL LOW (ref 3.87–5.11)
RDW: 14.6 % (ref 11.5–15.5)
WBC: 9.1 10*3/uL (ref 4.0–10.5)
nRBC: 0.2 % (ref 0.0–0.2)

## 2022-11-01 LAB — TYPE AND SCREEN
ABO/RH(D): A POS
Antibody Screen: NEGATIVE
Unit division: 0

## 2022-11-01 LAB — BPAM RBC
Blood Product Expiration Date: 202403092359
ISSUE DATE / TIME: 202402201722
Unit Type and Rh: 6200

## 2022-11-01 LAB — GLUCOSE, CAPILLARY
Glucose-Capillary: 143 mg/dL — ABNORMAL HIGH (ref 70–99)
Glucose-Capillary: 125 mg/dL — ABNORMAL HIGH (ref 70–99)
Glucose-Capillary: 97 mg/dL (ref 70–99)
Glucose-Capillary: 114 mg/dL — ABNORMAL HIGH (ref 70–99)

## 2022-11-01 MED ORDER — CLOPIDOGREL BISULFATE 75 MG PO TABS
75.0000 mg | ORAL_TABLET | Freq: Every day | ORAL | Status: DC
  Administered 2022-11-01 – 2022-11-07 (×7): 75 mg via ORAL
  Filled 2022-11-01 (×7): qty 1

## 2022-11-01 MED ORDER — APIXABAN 2.5 MG PO TABS
2.5000 mg | ORAL_TABLET | Freq: Two times a day (BID) | ORAL | Status: DC
  Administered 2022-11-01 – 2022-11-07 (×13): 2.5 mg via ORAL
  Filled 2022-11-01 (×13): qty 1

## 2022-11-01 NOTE — Evaluation (Signed)
Occupational Therapy Evaluation Patient Details Name: Christy Sutton MRN: [id number] DOB: [birth year] Today's Date: 11/01/2022   History of Present Illness Admitted after ground-level fall resulting in L hip fx, now post surgical fixation, WBAT;  has a past medical history of Atrial fibrillation ([place]), Diabetes mellitus without complication ([place]), Hyperlipidemia, Hypertension, Stroke ([place]), and TIA (transient ischemic attack).   Clinical Impression   PTA, pt lived with grandson with at least supervision for ADL. Upon eval, pt presents with decreased balance, strength, safety, cognition, and L hip pain. Pt performing UB Adl with up to min A and LB ADL with up to max A. Once movement was initiated during session, pt very motivated and agreeable to participation in ADL. Pt performing multiple STS transfers during session, grooming at sink, and using the restroom. Due to pt with good support, significant change in functional status, and motivation, highly recommending continued multidisciplinary therapy at [facility] to restore PLOF for ADL and IADL.      Recommendations for follow up therapy are one component of a multi-disciplinary discharge planning process, led by the attending physician.  Recommendations may be updated based on patient status, additional functional criteria and insurance authorization.   Follow Up Recommendations  Acute inpatient rehab (3hours/day)     Assistance Recommended at Discharge Frequent or constant Supervision/Assistance  Patient can return home with the following Two people to help with walking and/or transfers;Two people to help with bathing/dressing/bathroom;Assistance with cooking/housework;Assist for transportation;Help with stairs or ramp for entrance;Direct supervision/assist for financial management;Direct supervision/assist for medications management    Functional Status Assessment  Patient has had a recent decline in their functional status and demonstrates the  ability to make significant improvements in function in a reasonable and predictable amount of time.  Equipment Recommendations  Other (comment) (defer)    Recommendations for Other Services Rehab consult     Precautions / Restrictions Precautions Precautions: Fall Precaution Comments: monitor BP Restrictions Weight Bearing Restrictions: No LLE Weight Bearing: Weight bearing as tolerated      Mobility Bed Mobility               General bed mobility comments: In recliner opn arrival and departure    Transfers Overall transfer level: Needs assistance Equipment used: Ambulation equipment used [employer]) Transfers: Sit to/from Stand Sit to Stand: Min assist, Mod assist, +2 physical assistance, +2 safety/equipment, From elevated surface           General transfer comment: Mod A _2 up from recliner with assist to rise, cues and light assist to scoot to edge of chair and anterior weight shift. MIn A up from sitting on flaps of stedy; pt with good and consistent effort. Heavy mod+2 lifting assist up from toilet in restroom. Pt with fair awareness of limitations of L hip      Balance Overall balance assessment: Needs assistance Sitting-balance support: Single extremity supported, Bilateral upper extremity supported, Feet supported Sitting balance-Leahy Scale: Fair Sitting balance - Comments: Able to perform pericare on commode. Maintaining sitting balance on stedy while washing hands.   Standing balance support: Bilateral upper extremity supported, During functional activity Standing balance-Leahy Scale: Poor Standing balance comment: BUE support on stedy; static standing with min A to min guard for very brief periods                           ADL either performed or assessed with clinical judgement   ADL Overall ADL's : Needs assistance/impaired Eating/Feeding: Set  up;Bed level   Grooming: Wash/dry hands;Sitting;Min guard;Cueing for sequencing Grooming  Details (indicate cue type and reason): sittting on Stedy, overall good trunk control. Cues to use soap Upper Body Bathing: Min guard;Sitting   Lower Body Bathing: Maximal assistance;Sit to/from stand;+2 for physical assistance;+2 for safety/equipment   Upper Body Dressing : Minimal assistance;Sitting   Lower Body Dressing: Maximal assistance;+2 for physical assistance;+2 for safety/equipment;Sit to/from stand Lower Body Dressing Details (indicate cue type and reason): Total A to don new socks sitting on toilet as they became wet after wiping and handing wash cloth back to therapist Toilet Transfer: Moderate assistance;+2 for safety/equipment;+2 for physical assistance;Comfort height toilet (stedy)   Toileting- Clothing Manipulation and Hygiene: Min guard;Sitting/lateral lean Toileting - Clothing Manipulation Details (indicate cue type and reason): able to wipe self with weight shift with warm wash cloth. Decr awareness of wash cloth hitting water in toilet, so messy with handing back to therapist     Functional mobility during ADLs: Moderate assistance;+2 for physical assistance;+2 for safety/equipment Charlaine Dalton)       Vision Ability to See in Adequate Light: 0 Adequate Patient Visual Report: No change from baseline Vision Assessment?: No apparent visual deficits Additional Comments: WFL for tasks assessed     Perception     Praxis      Pertinent Vitals/Pain Pain Assessment Pain Assessment: Faces Faces Pain Scale: Hurts little more Pain Location: L hip with getting up Pain Descriptors / Indicators: Grimacing, Guarding Pain Intervention(s): Limited activity within patient's tolerance, Monitored during session     Hand Dominance Right   Extremity/Trunk Assessment Upper Extremity Assessment Upper Extremity Assessment: Generalized weakness   Lower Extremity Assessment Lower Extremity Assessment: Defer to PT evaluation       Communication Communication Communication: HOH  (extremely)   Cognition Arousal/Alertness: Awake/alert Behavior During Therapy: WFL for tasks assessed/performed Overall Cognitive Status: No family/caregiver present to determine baseline cognitive functioning                                 General Comments: gentle encouragement for initial mobility, but very pleasant and agreeable with reassurance OT/PT will help her. Overall good awareness that she is limited due to L hip pain and decr strength. Pt required cues throughout for sequencing and problem solving. Very hard of hearing impacting successful and quick communication, but follows commands she can hear well.     General Comments  max HR of 150 with exertion/transfers. See PT note for BP at end of session. VSS at end of session    Exercises Exercises: Other exercises Other Exercises Other Exercises: STS x3   Shoulder Instructions      Home Living Family/patient expects to be discharged to:: Private residence Living Arrangements: Other relatives Yolanda Bonine) Available Help at Discharge: Family;Available 24 hours/day Type of Home: House Home Access: Stairs to enter [employer] of Steps: 6 Entrance Stairs-Rails: Right Home Layout: One level     Bathroom Shower/Tub: Occupational psychologist: Standard     Home Equipment: Conservation officer, nature (2 wheels);Wheelchair - manual   Additional Comments: Above mostly gleaned from chart review; will need to be verified      Prior Functioning/Environment Prior Level of Function : Needs assist             Mobility Comments: walks with RW short distances; reports always has a family member beside ehr when she walks ADLs Comments: Sounds like pt sponge bathes  OT Problem List: Decreased strength;Decreased activity tolerance;Impaired balance (sitting and/or standing);Decreased cognition;Decreased safety awareness;Decreased knowledge of use of DME or AE;Decreased knowledge of  precautions;Cardiopulmonary status limiting activity;Pain      OT Treatment/Interventions: Self-care/ADL training;Therapeutic exercise;DME and/or AE instruction;Balance training;Patient/family education;Therapeutic activities;Cognitive remediation/compensation    OT Goals(Current goals can be found in the care plan section) Acute Rehab OT Goals Patient Stated Goal: none stated OT Goal Formulation: With patient Time For Goal Achievement: 11/15/22 Potential to Achieve Goals: Good  OT Frequency: Min 2X/week    Co-evaluation              AM-PAC OT "6 Clicks" Daily Activity     Outcome Measure Help from another person eating meals?: None Help from another person taking care of personal grooming?: A Little Help from another person toileting, which includes using toliet, bedpan, or urinal?: A Lot Help from another person bathing (including washing, rinsing, drying)?: A Lot Help from another person to put on and taking off regular upper body clothing?: A Little Help from another person to put on and taking off regular lower body clothing?: A Lot 6 Click Score: 16   End of Session Equipment Utilized During Treatment: Gait belt;Other (comment) (stedy) Nurse Communication: Mobility status;Other (comment) (no chair alarm box and chair alarm not set on entry)  Activity Tolerance: Patient tolerated treatment well Patient left: in chair;with call bell/phone within reach  OT Visit Diagnosis: Unsteadiness on feet (R26.81);Other abnormalities of gait and mobility (R26.89);Muscle weakness (generalized) (M62.81);Other symptoms and signs involving cognitive function;Pain;History of falling (Z91.81) Pain - Right/Left: Left Pain - part of body: Hip                Time: [id number] OT Time Calculation (min): 35 min Charges:  OT General Charges $OT Visit: 1 Visit OT Evaluation $OT Eval Low Complexity: 1 Low  Elder Cyphers, OTR/L [facility] Acute Rehabilitation Office: [phone]   Magnus Ivan 11/01/2022, 10:05 AM

## 2022-11-01 NOTE — Progress Notes (Signed)
PROGRESS NOTE  Christy Sutton  DOB: 1926-05-23  PCP: Donnajean Lopes, MD [id number]  DOA: 10/27/2022  LOS: 4 days  Hospital Day: 6  Brief narrative: Christy Sutton is a 89 y.o. female with PMH significant for DM2, HTN, HLD, A-fib, stroke with residual left-sided weakness 2/16, patient presented to the ED after a fall. Imaging showed left intertrochanteric fracture Admitted to [facility] 2/18, underwent intramedullary nailing of the fracture.  Subjective: Patient was seen and examined this afternoon.  Lying on her recliner.  Not in distress.  No new symptoms.  Tachycardia improved after blood transfusion yesterday.  Assessment and plan: Closed intertrochanteric fracture of left femur S/p IM nailing 2/18 Pain control and DVT prophylaxis per orthopedics.   PTA on Eliquis and Plavix which were held for surgery.  Acute blood loss anemia Baseline hemoglobin normal at 12.2.  Noted drop in hemoglobin down to 7.1 yesterday.  Went to PRBC transfusion was given after which hemoglobin improved to 9.2.  Resume Eliquis and Plavix today. Recent Labs    10/27/22 2200 10/28/22 0341 10/30/22 0458 10/31/22 0234 11/01/22 0227  HGB 12.2 11.7* 7.5* 7.1* 9.2*  MCV 93.0 93.7 93.5 92.0 87.7   Atrial fibrillation, chronic Not on any AV nodal blocking agent.   Currently remains mildly tachycardic probably due to anemia.  Continue to monitor in telemetry. PTA on Eliquis. Plan as above.    History of CVA with residual left-sided weakness HLD Continue simvastatin.   PTA on Eliquis and Plavix.  Plan as above  Essential hypertension PTA on Lasix 20 mg twice daily.  Currently on hold   Type 2 diabetes mellitus A1c 5.7 on 10/28/2022 Not on antidiabetic meds Currently on sliding scale insulin with Accu-Cheks. Recent Labs  Lab 10/31/22 1134 10/31/22 1559 10/31/22 2142 11/01/22 0728 11/01/22 1118  GLUCAP 106* 127* 114* 143* 125*   Mobility: PT eval obtained.  CIR recommended  Goals of care    Code Status: DNR     DVT prophylaxis:  apixaban (ELIQUIS) tablet 2.5 mg Start: 11/01/22 1530 SCDs Start: 10/29/22 1032 SCDs Start: 10/28/22 0114 apixaban (ELIQUIS) tablet 2.5 mg   Antimicrobials: None Fluid: None Consultants: Orthopedics Family Communication: Family not at bedside  Status is: Inpatient Level of care: Telemetry Medical   Dispo: Patient is from: Home              Anticipated d/c is to: [facility] recommended.  Medically stable for discharge  Scheduled Meds:  apixaban  2.5 mg Oral BID   Chlorhexidine Gluconate Cloth  6 each Topical Q0600   clopidogrel  75 mg Oral Daily   docusate  100 mg Oral BID   feeding supplement  237 mL Oral BID BM   multivitamin with minerals  1 tablet Oral Daily   mupirocin ointment  1 Application Nasal BID    PRN meds: HYDROcodone-acetaminophen, menthol-cetylpyridinium **OR** phenol, metoCLOPramide **OR** metoCLOPramide (REGLAN) injection, morphine injection, ondansetron **OR** ondansetron (ZOFRAN) IV, senna-docusate   Infusions:    Diet:  Diet Order             Diet regular Room service appropriate? Yes; Fluid consistency: Thin  Diet effective now                   Antimicrobials: Anti-infectives (From admission, onward)    Start     Dose/Rate Route Frequency Ordered Stop   10/29/22 1600  ceFAZolin (ANCEF) IVPB 2g/100 mL premix        2 g 200 mL/hr over 30 Minutes  Intravenous Every 8 hours 10/29/22 1031 10/30/22 0046   10/29/22 0700  ceFAZolin (ANCEF) IVPB 2g/100 mL premix        2 g 200 mL/hr over 30 Minutes Intravenous To ShortStay Surgical 10/28/22 1959 10/29/22 0755   10/29/22 0625  ceFAZolin (ANCEF) 2-4 GM/100ML-% IVPB       Note to Pharmacy: Nyoka Cowden D: cabinet override      10/29/22 0625 10/29/22 0802       Skin assessment:  Pressure Injury 10/28/22 Sacrum (Active)  10/28/22 0200  Location: Sacrum  Location Orientation:   Staging:   Wound Description (Comments):   Present on Admission:        Nutritional status:  Body mass index is 23.92 kg/m.  Nutrition Problem: Increased nutrient needs Etiology: hip fracture Signs/Symptoms: estimated needs     Objective: Vitals:   11/01/22 0729 11/01/22 1219  BP: 139/63 133/74  Pulse: 89 (!) 101  Resp: 18 18  Temp: 99.2 F (37.3 C) 99 F (37.2 C)  SpO2:  98%    Intake/Output Summary (Last 24 hours) at 11/01/2022 1435 Last data filed at 11/01/2022 0800 Gross per 24 hour  Intake 670 ml  Output 300 ml  Net 370 ml   Filed Weights   10/27/22 2201 10/29/22 0653  Weight: 61.2 kg 61.2 kg   Weight change:  Body mass index is 23.92 kg/m.   Physical Exam: General exam: Pleasant, elderly Caucasian female. Skin: No rashes, lesions or ulcers. HEENT: Atraumatic, normocephalic, no obvious bleeding Lungs: Clear to auscultation bilaterally CVS: Heart rate improved.  No murmur GI/Abd soft, nontender, nondistended, bowel sound present CNS: Alert, awake, oriented to place only Psychiatry: Cheerfully demented.  Not aggressive, not restless at the time of my evaluation Extremities: No pedal edema, no calf tenderness  Data Review: I have personally reviewed the laboratory data and studies available.  F/u labs ordered Unresulted Labs (From admission, onward)    None       Total time spent in review of labs and imaging, patient evaluation, formulation of plan, documentation and communication with family: 60 minutes  Signed, Terrilee Croak, MD [facility] 11/01/2022

## 2022-11-01 NOTE — Progress Notes (Signed)
Physical Therapy Treatment Patient Details Name: Christy Sutton MRN: [id number] DOB: 1926/01/16 Today's Date: 11/01/2022   History of Present Illness Admitted after ground-level fall resulting in L hip fx, now post surgical fixation, WBAT;  has a past medical history of Atrial fibrillation ([place]), Diabetes mellitus without complication ([place]), Hyperlipidemia, Hypertension, Stroke ([place]), and TIA (transient ischemic attack).    PT Comments    Continuing work on functional mobility and activity tolerance;  Session focused on sit<>stand transfers with [facility]; Pt showing very nice increased activity tolerance, and standing tolerance; Stood to stedy form higher bed surface with min assist of 2; Needed more assist, heavy Mod assist to stand from lower commode; Seemed to be pleased that she could use the commode, and be up at the sink to wash;   I'm pleasantly surprised at her ability to stand today, and look forward to her initial gait training next session; Hopeful for [facility] for post-acute rehab to maximize independence and safety with mobility and ADLs in prep for getting home; She is participating well -- you simple need to tell her that she must do it when she asks, "do I have to?" (So far she has tended to ask "do I have to?" About 2 times each session); Being 62 uears old adds a medical complexity to her case as well.    Recommendations for follow up therapy are one component of a multi-disciplinary discharge planning process, led by the attending physician.  Recommendations may be updated based on patient status, additional functional criteria and insurance authorization.  Follow Up Recommendations  Acute inpatient rehab (3hours/day)     Assistance Recommended at Discharge Frequent or constant Supervision/Assistance  Patient can return home with the following A lot of help with walking and/or transfers;A lot of help with bathing/dressing/bathroom;Help with stairs or ramp for entrance;Assist for  transportation;Assistance with cooking/housework   Equipment Recommendations  Rolling walker (2 wheels);Wheelchair (measurements PT);Wheelchair cushion (measurements PT);BSC/3in1    Recommendations for Other Services Rehab consult (would liek to consider [facility])     Precautions / Restrictions Precautions Precautions: Fall Precaution Comments: monitor BP-- improved Restrictions Weight Bearing Restrictions: No LLE Weight Bearing: Weight bearing as tolerated     Mobility  Bed Mobility                    Transfers Overall transfer level: Needs assistance Equipment used: Ambulation equipment used Charlaine Dalton) Transfers: Sit to/from Stand Sit to Stand: Min assist, Mod assist, +2 physical assistance, +2 safety/equipment, From elevated surface           General transfer comment: Mod A _2 up from recliner with assist to rise, cues and light assist to scoot to edge of chair and anterior weight shift. MIn A up from sitting on flaps of stedy; pt with good and consistent effort. Heavy mod+2 lifting assist up from toilet in restroom. Pt with fair awareness of limitations of L hip    Ambulation/Gait                   Stairs             Wheelchair Mobility    Modified Rankin (Stroke Patients Only)       Balance     Sitting balance-Leahy Scale: Fair       Standing balance-Leahy Scale: Poor Standing balance comment: BUE support on stedy; static standing with min A to min guard for very brief periods  Cognition Arousal/Alertness: Awake/alert Behavior During Therapy: WFL for tasks assessed/performed Overall Cognitive Status: No family/caregiver present to determine baseline cognitive functioning                                 General Comments: gentle encouragement for initial mobility, but very pleasant and agreeable with reassurance OT/PT will help her. Overall good awareness that she is limited due to L hip  pain and decr strength. Pt required cues throughout for sequencing and problem solving. Very hard of hearing impacting successful and quick communication, but follows commands she can hear well.        Exercises Other Exercises Other Exercises: STS x3    General Comments General comments (skin integrity, edema, etc.): max HR of 150 with exertion/transfers. Ended session in chair; HR 121, O2 sats 97% on room air, BP 123/90      Pertinent Vitals/Pain Pain Assessment Pain Assessment: Faces Faces Pain Scale: Hurts little more Pain Location: L hip with getting up Pain Descriptors / Indicators: Grimacing, Guarding Pain Intervention(s): Limited activity within patient's tolerance    Home Living Family/patient expects to be discharged to:: Private residence Living Arrangements: Other relatives Biomedical scientist) Available Help at Discharge: Family;Available 24 hours/day Type of Home: House Home Access: Stairs to enter Entrance Stairs-Rails: Right Entrance Stairs-Number of Steps: 6   Home Layout: One level Home Equipment: Conservation officer, nature (2 wheels);Wheelchair - manual Additional Comments: Above mostly gleaned from chart review; will need to be verified    Prior Function            PT Goals (current goals can now be found in the care plan section) Acute Rehab PT Goals Patient Stated Goal: Hopes to be able to get home PT Goal Formulation: Patient unable to participate in goal setting Time For Goal Achievement: 11/13/22 Potential to Achieve Goals: Good Progress towards PT goals: Progressing toward goals    Frequency    Min 4X/week      PT Plan Current plan remains appropriate    Co-evaluation PT/OT/SLP Co-Evaluation/Treatment: Yes Reason for Co-Treatment: To address functional/ADL transfers PT goals addressed during session: Mobility/safety with mobility        AM-PAC PT "6 Clicks" Mobility   Outcome Measure  Help needed turning from your back to your side while in a flat  bed without using bedrails?: A Lot Help needed moving from lying on your back to sitting on the side of a flat bed without using bedrails?: A Lot Help needed moving to and from a bed to a chair (including a wheelchair)?: A Lot Help needed standing up from a chair using your arms (e.g., wheelchair or bedside chair)?: A Lot Help needed to walk in hospital room?: Total Help needed climbing 3-5 steps with a railing? : Total 6 Click Score: 10    End of Session Equipment Utilized During Treatment: Gait belt Activity Tolerance: Patient tolerated treatment well Patient left: with call bell/phone within reach;in chair Nurse Communication: Mobility status PT Visit Diagnosis: Unsteadiness on feet (R26.81);Other abnormalities of gait and mobility (R26.89);History of falling (Z91.81);Pain Pain - Right/Left: Left Pain - part of body: Hip     Time: [id number] PT Time Calculation (min) (ACUTE ONLY): 36 min  Charges:  $Therapeutic Activity: 8-22 mins                     Roney Marion, PT  [place] Office [phone]'    [place]  H Zona Pedro 11/01/2022, 12:01 PM

## 2022-11-01 NOTE — PMR Pre-admission (Shared)
PMR Admission Coordinator Pre-Admission Assessment  Patient: Christy Sutton is an 89 y.o., female MRN: [id number] DOB: 08/12/1926 Height: 5' 2.99" (160 cm) Weight: 61.2 kg              Insurance Information HMO: yes     PPO:      PCP:      IPA:      80/20:      OTHER:  PRIMARY: [employer] Medicare       Policy#: [id number]      Subscriber: Pt  CM Name:       Phone#: [phone]   Fax#: [phone] Pre-Cert#: [id number]       Employer:  Benefits:  Phone #:      Name:  Irene Shipper Date: 09/11/2022 - still active Deductible: does not have deductible OOP Max: does not have OOP max  CIR: 100% coverage SNF: 100% coverage Outpatient: 100% coverage Home Health:  100% coverage DME: 100% coverage Providers: in network   SECONDARY:       Policy#:       Phone#:   Development worker, community:       Phone#:   The Engineer, petroleum" for patients in Inpatient Rehabilitation Facilities with attached "Privacy Act [place] Records" was provided and verbally reviewed with: Patient  Emergency Contact Information Contact Information     Name Relation Home Work Mobile   Asante,Laurie Daughter   [phone]   Nikol, Sherba   [phone]      Current Medical History  Patient Admitting Diagnosis: L hip fracture History of Present Illness:  Christy Sutton is a 89 y.o. female with medical history significant for chronic atrial fibrillation on Eliquis, history of CVA with residual left-sided weakness, T2DM, HTN, HLD, diarrhea hard of hearing who presented to the [facility] ED 10/28/22 for evaluation of left hip pain after fall.  Patient lives at home with her grandson.  She normally ambulates with the use of a walker.  She has weakness on her left side due to prior strokes. She was in the kitchen opening the fridge when she lost her balance and fell onto her left side.  She developed significant pain in her left hip and was unable to bear weight.  This was witnessed by family. Patient does take  Eliquis 2.5 mg BID, last taken around 5:30 PM on 2/16.  Also on Plavix 75 mg daily, last dose 2/16 AM.  Initial vitals showed BP 132/70, pulse 103, RR 14, temperature 97.7 F rectally, SpO2 95% on room air. Labs show sodium 140, potassium 3.9, bicarb 22, BUN 7, creatinine 0.90, serum glucose 193, LFTs within normal limits, WBC 9.8, hemoglobin 12.2, platelets 263,000, INR 1.7.  Pelvic x-ray showed a minimally displaced intertrochanteric left hip fracture. Portable chest x-ray negative for focal consolidation, edema, effusion. CT head without contrast negative for acute intracranial process. CT cervical spine without contrast negative for acute spine fracture.  Stable multilevel spondylosis noted. Left wrist x-ray negative for acute fracture or dislocation. Patient was given 500 cc normal saline.  Pt. Underwent IM nail of R hip fracture with ortho 10/29/22. Pt. Also with ABL, requiring blood transfusion. Seen by PT/OT and they recommend CiR to assist return to PLOF.   Glasgow Coma Scale Score: 14  Patient's medical record from [facility]  has been reviewed by the rehabilitation admission coordinator and physician.  Past Medical History  Past Medical History:  Diagnosis Date   Atrial fibrillation ([place])    Diabetes mellitus without complication (  [place])    Hyperlipidemia    Hypertension    Stroke ([place])    x 3   TIA (transient ischemic attack)     Has the patient had major surgery during 100 days prior to admission? Yes  Family History  family history includes Heart attack in her father; Stroke in her father, mother, sister, and sister.   Current Medications   Current Facility-Administered Medications:    Chlorhexidine Gluconate Cloth 2 % PADS 6 each, 6 each, Topical, Q0600, Dana Allan I, MD, 6 each at 11/01/22 0831   docusate (COLACE) 50 MG/5ML liquid 100 mg, 100 mg, Oral, BID, Dahal, Binaya, MD, 100 mg at 11/01/22 0830   feeding supplement (ENSURE ENLIVE / ENSURE PLUS)  liquid 237 mL, 237 mL, Oral, BID BM, Nicholes Stairs, MD, 237 mL at 10/29/22 1446   HYDROcodone-acetaminophen (NORCO/VICODIN) 5-325 MG per tablet 1-2 tablet, 1-2 tablet, Oral, Q6H PRN, Nicholes Stairs, MD, 1 tablet at 10/30/22 2012   menthol-cetylpyridinium (CEPACOL) lozenge 3 mg, 1 lozenge, Oral, PRN **OR** phenol (CHLORASEPTIC) mouth spray 1 spray, 1 spray, Mouth/Throat, PRN, Nicholes Stairs, MD   metoCLOPramide (REGLAN) tablet 5-10 mg, 5-10 mg, Oral, Q8H PRN **OR** metoCLOPramide (REGLAN) injection 5-10 mg, 5-10 mg, Intravenous, Q8H PRN, Nicholes Stairs, MD   morphine (PF) 2 MG/ML injection 0.5 mg, 0.5 mg, Intravenous, Q2H PRN, Nicholes Stairs, MD   multivitamin with minerals tablet 1 tablet, 1 tablet, Oral, Daily, Nicholes Stairs, MD, 1 tablet at 11/01/22 0830   mupirocin ointment (BACTROBAN) 2 % 1 Application, 1 Application, Nasal, BID, Dana Allan I, MD, 1 Application at [id number]   ondansetron (ZOFRAN) tablet 4 mg, 4 mg, Oral, Q6H PRN **OR** ondansetron (ZOFRAN) injection 4 mg, 4 mg, Intravenous, Q6H PRN, Nicholes Stairs, MD   senna-docusate (Senokot-S) tablet 1 tablet, 1 tablet, Oral, QHS PRN, Nicholes Stairs, MD, 1 tablet at 10/30/22 2012  Patients Current Diet:  Diet Order             Diet regular Room service appropriate? Yes; Fluid consistency: Thin  Diet effective now                   Precautions / Restrictions Precautions Precautions: Fall Precaution Comments: monitor BP-- improved Restrictions Weight Bearing Restrictions: No LLE Weight Bearing: Weight bearing as tolerated   Has the patient had 2 or more falls or a fall with injury in the past year?Yes  Prior Activity Level Limited Community (1-2x/wk): pt went out for appointments  Prior Functional Level Prior Function Prior Level of Function : Needs assist Mobility Comments: walks with RW short distances; reports always has a family member beside ehr when  she walks ADLs Comments: Sounds like pt sponge bathes  Self Care: Did the patient need help bathing, dressing, using the toilet or eating?  Independent  Indoor Mobility: Did the patient need assistance with walking from room to room (with or without device)? Independent  Stairs: Did the patient need assistance with internal or external stairs (with or without device)? Independent  Functional Cognition: Did the patient need help planning regular tasks such as shopping or remembering to take medications? Dependent  Patient Information Are you of Hispanic, Latino/a,or Spanish origin?: A. No, not of Hispanic, Latino/a, or Spanish origin What is your race?: A. White Do you need or want an interpreter to communicate with a doctor or health care staff?: 0. No  Patient's Response To:  Health Literacy and Transportation Is the patient  able to respond to health literacy and transportation needs?: Yes Health Literacy - How often do you need to have someone help you when you read instructions, pamphlets, or other written material from your doctor or pharmacy?: Sometimes In the past 12 months, has lack of transportation kept you from medical appointments or from getting medications?: No In the past 12 months, has lack of transportation kept you from meetings, work, or from getting things needed for daily living?: No  Home Assistive Devices / Equipment Home Equipment: Conservation officer, nature (2 wheels), Wheelchair - manual  Prior Device Use: Indicate devices/aids used by the patient prior to current illness, exacerbation or injury? Walker  Current Functional Level Cognition  Overall Cognitive Status: No family/caregiver present to determine baseline cognitive functioning Orientation Level: Oriented to person, Oriented to place, Disoriented to time, Disoriented to situation (Pt does know month but not year) General Comments: gentle encouragement for initial mobility, but very pleasant and agreeable with  reassurance OT/PT will help her. Overall good awareness that she is limited due to L hip pain and decr strength. Pt required cues throughout for sequencing and problem solving. Very hard of hearing impacting successful and quick communication, but follows commands she can hear well.    Extremity Assessment (includes Sensation/Coordination)  Upper Extremity Assessment: Generalized weakness  Lower Extremity Assessment: Defer to PT evaluation LLE Deficits / Details: Grossly decr aROM adn strength, limited by pain postop    ADLs  Overall ADL's : Needs assistance/impaired Eating/Feeding: Set up, Bed level Grooming: Wash/dry hands, Sitting, Min guard, Cueing for sequencing Grooming Details (indicate cue type and reason): sittting on Stedy, overall good trunk control. Cues to use soap Upper Body Bathing: Min guard, Sitting Lower Body Bathing: Maximal assistance, Sit to/from stand, +2 for physical assistance, +2 for safety/equipment Upper Body Dressing : Minimal assistance, Sitting Lower Body Dressing: Maximal assistance, +2 for physical assistance, +2 for safety/equipment, Sit to/from stand Lower Body Dressing Details (indicate cue type and reason): Total A to don new socks sitting on toilet as they became wet after wiping and handing wash cloth back to therapist Toilet Transfer: Moderate assistance, +2 for safety/equipment, +2 for physical assistance, Comfort height toilet (stedy) Toileting- Clothing Manipulation and Hygiene: Min guard, Sitting/lateral lean Toileting - Clothing Manipulation Details (indicate cue type and reason): able to wipe self with weight shift with warm wash cloth. Decr awareness of wash cloth hitting water in toilet, so messy with handing back to therapist Functional mobility during ADLs: Moderate assistance, +2 for physical assistance, +2 for safety/equipment (Stedy)    Mobility  Overal bed mobility: Needs Assistance Bed Mobility: Supine to Sit, Sit to Supine Supine to  sit: Max assist, +2 for safety/equipment Sit to supine: Max assist, +2 for safety/equipment General bed mobility comments: In recliner opn arrival and departure    Transfers  Overall transfer level: Needs assistance Equipment used: Ambulation equipment used Charlaine Dalton) Transfers: Sit to/from Stand Sit to Stand: Min assist, Mod assist, +2 physical assistance, +2 safety/equipment, From elevated surface Bed to/from chair/wheelchair/BSC transfer type:: Stand pivot Stand pivot transfers: Mod assist, +2 physical assistance General transfer comment: Mod A _2 up from recliner with assist to rise, cues and light assist to scoot to edge of chair and anterior weight shift. MIn A up from sitting on flaps of stedy; pt with good and consistent effort. Heavy mod+2 lifting assist up from toilet in restroom. Pt with fair awareness of limitations of L hip    Ambulation / Gait / Stairs / Wheelchair  Mobility       Posture / Balance Dynamic Sitting Balance Sitting balance - Comments: Able to perform pericare on commode. Maintaining sitting balance on stedy while washing hands. Balance Overall balance assessment: Needs assistance Sitting-balance support: Single extremity supported, Bilateral upper extremity supported, Feet supported Sitting balance-Leahy Scale: Fair Sitting balance - Comments: Able to perform pericare on commode. Maintaining sitting balance on stedy while washing hands. Standing balance support: Bilateral upper extremity supported, During functional activity Standing balance-Leahy Scale: Poor Standing balance comment: BUE support on stedy; static standing with min A to min guard for very brief periods    Special needs/care consideration Skin surgical site; Pt. Very HOH     Previous Home Environment (from acute therapy documentation) Living Arrangements: Other relatives Biomedical scientist) Available Help at Discharge: Family, Available 24 hours/day Type of Home: House Home Layout: One level Home  Access: Stairs to enter Entrance Stairs-Rails: None Entrance Stairs-Number of Steps: 2 Bathroom Shower/Tub: Multimedia programmer: Standard Additional Comments: Above mostly gleaned from chart review; will need to be verified  Discharge Living Setting Plans for Discharge Living Setting: Patient's home, House Type of Home at Discharge: House Discharge Home Layout: One level Discharge Home Access: Stairs to enter Entrance Stairs-Rails: None Entrance Stairs-Number of Steps: 2 Discharge Bathroom Shower/Tub: Walk-in shower Discharge Bathroom Toilet: Standard Discharge Bathroom Accessibility: Yes How Accessible: Accessible via walker  Social/Family/Support Systems Patient Roles: Other (Comment) Contact Information: [phone] Anticipated Caregiver: [name] (daughter) and Noe Gens [phone] Anticipated Caregiver's Contact Information: Pt lives with grandson, he works but home inthe evenings. [name] and her husnand are retired and can assist days. All can do min A Ability/Limitations of Caregiver: Min A, some Mod A with ADLS Caregiver Availability: 24/7 Is Caregiver In Agreement with Plan?: Yes Does Caregiver/Family have Issues with Lodging/Transportation while Pt is in Rehab?: No   Goals Patient/Family Goal for Rehab: PT/OT Min A to Supervision Expected length of stay: 16-18 days Pt/Family Agrees to Admission and willing to participate: Yes Program Orientation Provided & Reviewed with Pt/Caregiver Including Roles  & Responsibilities: Yes   Decrease burden of Care through IP rehab admission: Othern/a    Possible need for SNF placement upon discharge:none    Patient Condition: {PATIENT'S CONDITION:22832}  Preadmission Screen Completed By:  Genella Mech, CCC-SLP, 11/01/2022 1:26 PM ______________________________________________________________________   Discussed status with Dr. Marland Kitchenon***at *** and received approval for admission today.  Admission  Coordinator:  Genella Mech, time***/Date***

## 2022-11-01 NOTE — Progress Notes (Signed)
Pt wants to stay in chair.  Will monitor.

## 2022-11-01 NOTE — Consult Note (Addendum)
Physical Medicine and Rehabilitation Consult Reason for Consult:Rehab Referring Physician: Terrilee Croak MD   HPI: Christy Sutton is a 89 y.o. female with past medical history of A-fib on Eliquis, diabetes mellitus type 2, hypertension, hard of hearing, HLD, prior CVA with residual left sided weakness.  She presented to the hospital on 10/27/22 after she had a fall and developed left-sided hip pain.  Much of history from chart review as patient is very hard of hearing and family not at bedside today.  It appears she normally ambulates with the use of a walker due to prior left-sided deficits as a result of strokes in the past.  It appears she was independent with most ADLs.  It appears she felt on her left side in the kitchen while opening the refrigerator door.  Patient takes Eliquis and Plavix daily.  Pelvic x-ray positive for minimally displaced intertrochanteric left hip fracture.  Chest x-ray unremarkable.  Head CT no acute process.  Review of Systems  Reason unable to perform ROS: Limited by very hard of hearing.  Musculoskeletal:  Positive for joint pain.  Neurological:  Positive for weakness. Negative for sensory change.   Past Medical History:  Diagnosis Date   Atrial fibrillation ([place])    Diabetes mellitus without complication ([place])    Hyperlipidemia    Hypertension    Stroke ([place])    x 3   TIA (transient ischemic attack)    Past Surgical History:  Procedure Laterality Date   ABDOMINAL HYSTERECTOMY     due to tubal pregnancies   INTRAMEDULLARY (IM) NAIL INTERTROCHANTERIC Left 10/29/2022   Procedure: INTRAMEDULLARY (IM) NAIL INTERTROCHANTERIC;  Surgeon: Nicholes Stairs, MD;  Location: [facility];  Service: Orthopedics;  Laterality: Left;   TEE WITHOUT CARDIOVERSION N/A 01/30/2017   Procedure: TRANSESOPHAGEAL ECHOCARDIOGRAM (TEE);  Surgeon: Adrian Prows, MD;  [facility] ENDOSCOPY;  Service: Cardiovascular;  Laterality: N/A;   TUBAL LIGATION     Family History  Problem  Relation Age of Onset   Heart attack Father    Stroke Father    Stroke Mother    Stroke Sister    Stroke Sister    Social History:  reports that she has quit smoking. Her smoking use included cigarettes. She has never used smokeless tobacco. She reports that she does not drink alcohol and does not use drugs. Allergies:  Allergies  Allergen Reactions   Penicillins Hives    Has patient had a PCN reaction causing immediate rash, facial/tongue/throat swelling, SOB or lightheadedness with hypotension: Yes Has patient had a PCN reaction causing severe rash involving mucus membranes or skin necrosis: Unknown Has patient had a PCN reaction that required hospitalization: Unknown Has patient had a PCN reaction occurring within the last 10 years: No If all of the above answers are "NO", then may proceed with Cephalosporin use.    Medications Prior to Admission  Medication Sig Dispense Refill   clopidogrel (PLAVIX) 75 MG tablet Take 75 mg by mouth daily.     ELIQUIS 2.5 MG TABS tablet Take 2.5 mg by mouth 2 (two) times daily.     furosemide (LASIX) 20 MG tablet Take 20 mg by mouth 2 (two) times a week. Monday and Friday     KLOR-CON M20 20 MEQ tablet Take 20 mEq by mouth every Monday, Wednesday, and Friday.     Multiple Vitamin (MULTIVITAMIN) tablet Take 1 tablet by mouth daily.     simvastatin (ZOCOR) 40 MG tablet Take 40 mg by  mouth daily.      Home: Home Living Family/patient expects to be discharged to:: Private residence Living Arrangements: Other relatives Biomedical scientist) Available Help at Discharge: Family, Available 24 hours/day Type of Home: House Home Access: Stairs to enter Technical brewer of Steps: 2 Entrance Stairs-Rails: None Home Layout: One level Bathroom Shower/Tub: Multimedia programmer: Standard Home Equipment: Conservation officer, nature (2 wheels), Wheelchair - manual Additional Comments: Above mostly gleaned from chart review; will need to be verified  Functional  History: Prior Function Prior Level of Function : Needs assist Mobility Comments: walks with RW short distances; reports always has a family member beside ehr when she walks ADLs Comments: Sounds like pt sponge bathes Functional Status:  Mobility: Bed Mobility Overal bed mobility: Needs Assistance Bed Mobility: Supine to Sit, Sit to Supine Supine to sit: Max assist, +2 for safety/equipment Sit to supine: Max assist, +2 for safety/equipment General bed mobility comments: In recliner opn arrival and departure Transfers Overall transfer level: Needs assistance Equipment used: Ambulation equipment used Charlaine Dalton) Transfers: Sit to/from Stand Sit to Stand: Min assist, Mod assist, +2 physical assistance, +2 safety/equipment, From elevated surface Bed to/from chair/wheelchair/BSC transfer type:: Stand pivot Stand pivot transfers: Mod assist, +2 physical assistance General transfer comment: Mod A _2 up from recliner with assist to rise, cues and light assist to scoot to edge of chair and anterior weight shift. MIn A up from sitting on flaps of stedy; pt with good and consistent effort. Heavy mod+2 lifting assist up from toilet in restroom. Pt with fair awareness of limitations of L hip      ADL: ADL Overall ADL's : Needs assistance/impaired Eating/Feeding: Set up, Bed level Grooming: Wash/dry hands, Sitting, Min guard, Cueing for sequencing Grooming Details (indicate cue type and reason): sittting on Stedy, overall good trunk control. Cues to use soap Upper Body Bathing: Min guard, Sitting Lower Body Bathing: Maximal assistance, Sit to/from stand, +2 for physical assistance, +2 for safety/equipment Upper Body Dressing : Minimal assistance, Sitting Lower Body Dressing: Maximal assistance, +2 for physical assistance, +2 for safety/equipment, Sit to/from stand Lower Body Dressing Details (indicate cue type and reason): Total A to don new socks sitting on toilet as they became wet after wiping and  handing wash cloth back to therapist Toilet Transfer: Moderate assistance, +2 for safety/equipment, +2 for physical assistance, Comfort height toilet (stedy) Toileting- Clothing Manipulation and Hygiene: Min guard, Sitting/lateral lean Toileting - Clothing Manipulation Details (indicate cue type and reason): able to wipe self with weight shift with warm wash cloth. Decr awareness of wash cloth hitting water in toilet, so messy with handing back to therapist Functional mobility during ADLs: Moderate assistance, +2 for physical assistance, +2 for safety/equipment Charlaine Dalton)  Cognition: Cognition Overall Cognitive Status: No family/caregiver present to determine baseline cognitive functioning Orientation Level: Oriented to person, Oriented to place, Disoriented to time, Disoriented to situation (Pt does know month but not year) Cognition Arousal/Alertness: Awake/alert Behavior During Therapy: WFL for tasks assessed/performed Overall Cognitive Status: No family/caregiver present to determine baseline cognitive functioning General Comments: gentle encouragement for initial mobility, but very pleasant and agreeable with reassurance OT/PT will help her. Overall good awareness that she is limited due to L hip pain and decr strength. Pt required cues throughout for sequencing and problem solving. Very hard of hearing impacting successful and quick communication, but follows commands she can hear well.  Blood pressure 133/74, pulse (!) 101, temperature 99 F (37.2 C), temperature source Oral, resp. rate 18, height 5' 2.99" (1.6  m), weight 61.2 kg, SpO2 98 %. Physical Exam   General: Alert and awake, no apparent distress, sitting in bedside chair, frequently sliding in her chair and requiring reposition from nursing HEENT: Head is normocephalic, atraumatic, PERRLA, conjugate gaze, sclera anicteric, oral mucosa pink and moist, very hard of hearing Neck: Supple without JVD or lymphadenopathy Heart: Reg rate  and rhythm. No murmurs rubs or gallops Chest: CTA bilaterally without wheezes, rales, or rhonchi; no distress Abdomen: Soft, non-tender, non-distended, bowel sounds positive. Extremities: No clubbing, cyanosis, or edema. Pulses are 2+ Psych: Pt's affect is appropriate. Pt is cooperative Skin: Clean and intact without signs of breakdown To Aquacel dressings intact lateral left thigh, small amount of bloody drainage noted Multiple areas of ecchymosis in bilateral upper extremities Neuro: Alert and awake, follows commands however very limited by hard of hearing, cranial nerves II through XII grossly intact,  Strength no focal deficits noted, able to lift bilateral upper extremity and right lower extremity to gravity, able to move toes and left lower extremity.  MMT limited by very hard of hearing No ataxia or dysmetria noted Musculoskeletal: Tenderness over left hip No abnormal tone noted  Results for orders placed or performed during the hospital encounter of 10/27/22 (from the past 24 hour(s))  Glucose, capillary     Status: Abnormal   Collection Time: 10/31/22  3:59 PM  Result Value Ref Range   Glucose-Capillary 127 (H) 70 - 99 mg/dL  Glucose, capillary     Status: Abnormal   Collection Time: 10/31/22  9:42 PM  Result Value Ref Range   Glucose-Capillary 114 (H) 70 - 99 mg/dL  CBC     Status: Abnormal   Collection Time: 11/01/22  2:27 AM  Result Value Ref Range   WBC 9.1 4.0 - 10.5 K/uL   RBC 3.02 (L) 3.87 - 5.11 MIL/uL   Hemoglobin 9.2 (L) 12.0 - 15.0 g/dL   HCT 26.5 (L) 36.0 - 46.0 %   MCV 87.7 80.0 - 100.0 fL   MCH 30.5 26.0 - 34.0 pg   MCHC 34.7 30.0 - 36.0 g/dL   RDW 14.6 11.5 - 15.5 %   Platelets 156 150 - 400 K/uL   nRBC 0.2 0.0 - 0.2 %  Glucose, capillary     Status: Abnormal   Collection Time: 11/01/22  7:28 AM  Result Value Ref Range   Glucose-Capillary 143 (H) 70 - 99 mg/dL  Glucose, capillary     Status: Abnormal   Collection Time: 11/01/22 11:18 AM  Result Value  Ref Range   Glucose-Capillary 125 (H) 70 - 99 mg/dL   No results found.  Assessment/Plan: Diagnosis: Closed intertrochanteric left femur fracture S/p IM nailing 10/29/2022 Does the need for close, 24 hr/day medical supervision in concert with the patient's rehab needs make it unreasonable for this patient to be served in a less intensive setting? Yes Co-Morbidities requiring supervision/potential complications:  -Acute blood loss anemia, prior CVA with left residual weakness, diabetes mellitus type 2, hypertension, atrial fibrillation, hard of hearing Due to bladder management, bowel management, safety, skin/wound care, disease management, medication administration, pain management, and patient education, does the patient require 24 hr/day rehab nursing? Yes Does the patient require coordinated care of a physician, rehab nurse, therapy disciplines of PT,OT to address physical and functional deficits in the context of the above medical diagnosis(es)? Yes Addressing deficits in the following areas: balance, endurance, locomotion, strength, transferring, bowel/bladder control, bathing, dressing, feeding, grooming, toileting, and psychosocial support Can the patient actively  participate in an intensive therapy program of at least 3 hrs of therapy per day at least 5 days per week? Yes The potential for patient to make measurable gains while on inpatient rehab is excellent Anticipated functional outcomes upon discharge from inpatient rehab are supervision and min assist  with PT, supervision and min assist with OT, min assist and mod assist with SLP. Estimated rehab length of stay to reach the above functional goals is: 16-18 Anticipated discharge destination: Home Overall Rehab/Functional Prognosis: good  POST ACUTE RECOMMENDATIONS: This patient's condition is appropriate for continued rehabilitative care in the following setting: CIR Patient has agreed to participate in recommended program.  Yes Note that insurance prior authorization may be required for reimbursement for recommended care.  Comment: Patient with left hip fracture SP IM nail with baseline left-sided residual deficits after CVA.  She appears to have good family support.  I think she would be a good candidate for CIR.   Thanks,  Jennye Boroughs, MD 11/01/2022

## 2022-11-01 NOTE — Progress Notes (Signed)
Inpatient Rehab Admissions Coordinator:    I spoke with pt's daughter [name] regarding potential CIR admit. She is interested and states that Pt. Lives with her grandson, Mitzi Hansen, and that he is there in the evenings. She states that she and her husband can come stay with her during the days after CIR. Plan would be for CIR for 16-18 days, and to discharge at min A to supervision level. I will open a case with insurance and pursue for admit. Will likely require appeal, [name] is aware of this and would want to pursue an expedited appeal if CIR is denied.   Clemens Catholic, [place], [place] Admissions Coordinator  [phone] ([place]) [phone] (office)

## 2022-11-01 NOTE — Plan of Care (Signed)
  Problem: Education: Goal: Ability to describe self-care measures that may prevent or decrease complications (Diabetes Survival Skills Education) will improve Outcome: Not Progressing Goal: Individualized Educational Video(s) Outcome: Not Progressing   Problem: Coping: Goal: Ability to adjust to condition or change in health will improve Outcome: Not Progressing   Problem: Fluid Volume: Goal: Ability to maintain a balanced intake and output will improve Outcome: Not Progressing   Problem: Health Behavior/Discharge Planning: Goal: Ability to identify and utilize available resources and services will improve Outcome: Not Progressing Goal: Ability to manage health-related needs will improve Outcome: Not Progressing   Problem: Metabolic: Goal: Ability to maintain appropriate glucose levels will improve Outcome: Not Progressing   Problem: Nutritional: Goal: Maintenance of adequate nutrition will improve Outcome: Not Progressing Goal: Progress toward achieving an optimal weight will improve Outcome: Not Progressing   Problem: Skin Integrity: Goal: Risk for impaired skin integrity will decrease Outcome: Not Progressing   Problem: Tissue Perfusion: Goal: Adequacy of tissue perfusion will improve Outcome: Not Progressing   Problem: Education: Goal: Knowledge of General Education information will improve Description: Including pain rating scale, medication(s)/side effects and non-pharmacologic comfort measures Outcome: Not Progressing   Problem: Health Behavior/Discharge Planning: Goal: Ability to manage health-related needs will improve Outcome: Not Progressing   Problem: Clinical Measurements: Goal: Ability to maintain clinical measurements within normal limits will improve Outcome: Not Progressing Goal: Will remain free from infection Outcome: Not Progressing Goal: Diagnostic test results will improve Outcome: Not Progressing Goal: Respiratory complications will  improve Outcome: Not Progressing Goal: Cardiovascular complication will be avoided Outcome: Not Progressing   Problem: Activity: Goal: Risk for activity intolerance will decrease Outcome: Not Progressing   Problem: Nutrition: Goal: Adequate nutrition will be maintained Outcome: Not Progressing   Problem: Coping: Goal: Level of anxiety will decrease Outcome: Not Progressing   Problem: Elimination: Goal: Will not experience complications related to bowel motility Outcome: Not Progressing Goal: Will not experience complications related to urinary retention Outcome: Not Progressing   Problem: Pain Managment: Goal: General experience of comfort will improve Outcome: Not Progressing   Problem: Safety: Goal: Ability to remain free from injury will improve Outcome: Not Progressing   Problem: Skin Integrity: Goal: Risk for impaired skin integrity will decrease Outcome: Not Progressing   Problem: Education: Goal: Verbalization of understanding the information provided (i.e., activity precautions, restrictions, etc) will improve Outcome: Not Progressing Goal: Individualized Educational Video(s) Outcome: Not Progressing   Problem: Activity: Goal: Ability to ambulate and perform ADLs will improve Outcome: Not Progressing   Problem: Clinical Measurements: Goal: Postoperative complications will be avoided or minimized Outcome: Not Progressing   Problem: Self-Concept: Goal: Ability to maintain and perform role responsibilities to the fullest extent possible will improve Outcome: Not Progressing   Problem: Pain Management: Goal: Pain level will decrease Outcome: Not Progressing

## 2022-11-02 DIAGNOSIS — S72142A Displaced intertrochanteric fracture of left femur, initial encounter for closed fracture: Secondary | ICD-10-CM | POA: Diagnosis not present

## 2022-11-02 LAB — GLUCOSE, CAPILLARY
Glucose-Capillary: 113 mg/dL — ABNORMAL HIGH (ref 70–99)
Glucose-Capillary: 94 mg/dL (ref 70–99)
Glucose-Capillary: 95 mg/dL (ref 70–99)

## 2022-11-02 NOTE — Progress Notes (Signed)
Noted pt did not eat any breakfast and offered her the lunch and she said no ill wait until my family comes. Offered ensure she only took few sips

## 2022-11-02 NOTE — Progress Notes (Signed)
Physical Therapy Treatment Patient Details Name: Christy Sutton MRN: [id number] DOB: December 08, 1925 Today's Date: 11/02/2022   History of Present Illness Admitted after ground-level fall resulting in L hip fx, now post surgical fixation, WBAT;  has a past medical history of Atrial fibrillation ([place]), Diabetes mellitus without complication ([place]), Hyperlipidemia, Hypertension, Stroke ([place]), and TIA (transient ischemic attack).    PT Comments    Pt was received in the chair and agreeable to therapy with session focusing on initiating gait trial. Pt was able to sit upright using BUE for support, however had one posterior LOB in chair without UE support. Pt required up to mod A for scooting forward in the recliner. Pt was able to stand and perform short gait trial in room with up to min A +2 for RW management and to maintain balance. Pt requiring initial cues to widen BOS for improved standing balance. Pt required dense cues throughout, however had difficulty following at times due to being very [facility]. HR was monitored throughout session and noted to be as high as 159bpm with exertion, RN was notified. Pt continues to benefit from PT services to progress toward functional mobility goals.    Recommendations for follow up therapy are one component of a multi-disciplinary discharge planning process, led by the attending physician.  Recommendations may be updated based on patient status, additional functional criteria and insurance authorization.  Follow Up Recommendations  Acute inpatient rehab (3hours/day)     Assistance Recommended at Discharge Frequent or constant Supervision/Assistance  Patient can return home with the following A lot of help with walking and/or transfers;A lot of help with bathing/dressing/bathroom;Help with stairs or ramp for entrance;Assist for transportation;Assistance with cooking/housework   Equipment Recommendations  Rolling walker (2 wheels);Wheelchair (measurements PT);Wheelchair  cushion (measurements PT);BSC/3in1    Recommendations for Other Services       Precautions / Restrictions Precautions Precautions: Fall Restrictions Weight Bearing Restrictions: No LLE Weight Bearing: Weight bearing as tolerated     Mobility  Bed Mobility               General bed mobility comments: Sitting in recliner upon arrival    Transfers Overall transfer level: Needs assistance Equipment used: Rolling walker (2 wheels) Transfers: Sit to/from Stand Sit to Stand: Mod assist, +2 physical assistance           General transfer comment: From recliner to RW with min A +2 for power up. Pt required up to modA to scoot forward in recliner to set up for transfer.    Ambulation/Gait Ambulation/Gait assistance: Min assist, +2 physical assistance Gait Distance (Feet): 15 Feet Assistive device: Rolling walker (2 wheels) Gait Pattern/deviations: Step-through pattern, Decreased weight shift to left, Decreased stance time - left, Antalgic, Trunk flexed       General Gait Details: Pt demonstrated good step length with LLE, however initiated swing phase by twisting trunk to R. Pt initially required min A for RW management due to pt rolling it forward and backward with each step. Pt required dense verbal and tactile cues for upright posture and RW management to avoid obstacles.       Balance Overall balance assessment: Needs assistance Sitting-balance support: Bilateral upper extremity supported, Feet supported Sitting balance-Leahy Scale: Fair Sitting balance - Comments: Sitting in recliner   Standing balance support: Bilateral upper extremity supported, During functional activity Standing balance-Leahy Scale: Poor Standing balance comment: with RW support  Cognition Arousal/Alertness: Awake/alert Behavior During Therapy: WFL for tasks assessed/performed Overall Cognitive Status: No family/caregiver present to determine baseline  cognitive functioning                                 General Comments: Pt is very [facility], but is able to follow commands well when able to hear them.        Exercises      General Comments General comments (skin integrity, edema, etc.): HR as high as 159bpm with exertion with pt encouraged to take standing recovery breaks. Pt left in chair with HR as high as 135bpm, RN notified.      Pertinent Vitals/Pain Pain Assessment Pain Assessment: Faces Faces Pain Scale: Hurts little more Pain Location: L hip with getting up Pain Descriptors / Indicators: Grimacing, Guarding Pain Intervention(s): Limited activity within patient's tolerance, Monitored during session, Repositioned     PT Goals (current goals can now be found in the care plan section) Acute Rehab PT Goals Patient Stated Goal: Hopes to be able to get home PT Goal Formulation: Patient unable to participate in goal setting Time For Goal Achievement: 11/13/22 Potential to Achieve Goals: Good Progress towards PT goals: Progressing toward goals    Frequency    Min 4X/week      PT Plan Current plan remains appropriate       AM-PAC PT "6 Clicks" Mobility   Outcome Measure  Help needed turning from your back to your side while in a flat bed without using bedrails?: A Lot (not formally assessed) Help needed moving from lying on your back to sitting on the side of a flat bed without using bedrails?: A Lot (not formally assessed) Help needed moving to and from a bed to a chair (including a wheelchair)?: A Lot (+2 with RW) Help needed standing up from a chair using your arms (e.g., wheelchair or bedside chair)?: A Little Help needed to walk in hospital room?: A Lot (+2) Help needed climbing 3-5 steps with a railing? : Total 6 Click Score: 12    End of Session Equipment Utilized During Treatment: Gait belt Activity Tolerance: Patient tolerated treatment well Patient left: with call bell/phone within reach;in  chair;with chair alarm set Nurse Communication: Mobility status (HR) PT Visit Diagnosis: Unsteadiness on feet (R26.81);Other abnormalities of gait and mobility (R26.89);History of falling (Z91.81);Pain Pain - Right/Left: Left Pain - part of body: Hip     Time: 1030-1055 PT Time Calculation (min) (ACUTE ONLY): 25 min  Charges:  $Gait Training: 23-37 mins                     Michelle Nasuti, PTA Acute Rehabilitation Services Secure Chat Preferred  [phone]    Michelle Nasuti 11/02/2022, 11:22 AM

## 2022-11-02 NOTE — Progress Notes (Signed)
PROGRESS NOTE  Stephiane Burleson  DOB: 1926-06-22  PCP: Donnajean Lopes, MD [id number]  DOA: 10/27/2022  LOS: 5 days  Hospital Day: 7  Brief narrative: Christy Sutton is a 89 y.o. female with PMH significant for DM2, HTN, HLD, A-fib, stroke with residual left-sided weakness 2/16, patient presented to the ED after a fall. Imaging showed left intertrochanteric fracture Admitted to [facility] 2/18, underwent intramedullary nailing of the fracture.  Subjective: Patient was seen and examined this morning.  Lying on recliner.  Not in distress.  No new symptoms.  Heart rate in 90s mostly.  Assessment and plan: Closed intertrochanteric fracture of left femur S/p IM nailing 2/18 Pain control with tramadol as needed Eliquis resumed  Acute blood loss anemia Baseline hemoglobin normal at 12.2.  Noted drop in hemoglobin down to 7.1 on 2/20.  Monitor PRBC transfusion given with improvement in hemoglobin to 9.2.  Eliquis and Plavix were resumed on 2/21.  Recheck hemoglobin intermittently. Recent Labs    10/27/22 2200 10/28/22 0341 10/30/22 0458 10/31/22 0234 11/01/22 0227  HGB 12.2 11.7* 7.5* 7.1* 9.2*  MCV 93.0 93.7 93.5 92.0 87.7   Atrial fibrillation, chronic Not on any AV nodal blocking agent.   Heart rate in 90s mostly. Continue to monitor in telemetry. PTA on Eliquis.  Was held for surgery.  Resumed postop.   History of CVA with residual left-sided weakness HLD Continue simvastatin.   Continue Eliquis and Plavix  Essential hypertension PTA on Lasix 20 mg twice daily.  Currently on hold   Type 2 diabetes mellitus A1c 5.7 on 10/28/2022 Not on antidiabetic meds Currently on sliding scale insulin with Accu-Cheks. Recent Labs  Lab 10/31/22 2142 11/01/22 0728 11/01/22 1118 11/01/22 1555 11/01/22 2034  GLUCAP 114* 143* 125* 97 114*   Mobility: PT eval obtained.  CIR recommended  Goals of care   Code Status: DNR     DVT prophylaxis:  apixaban (ELIQUIS) tablet 2.5 mg Start:  11/01/22 1530 SCDs Start: 10/29/22 1032 SCDs Start: 10/28/22 0114 apixaban (ELIQUIS) tablet 2.5 mg   Antimicrobials: None Fluid: None Consultants: Orthopedics Family Communication: Family not at bedside  Status is: Inpatient Level of care: Telemetry Medical   Dispo: Patient is from: Home              Anticipated d/c is to: CIR recommended.  Medically stable for discharge  Scheduled Meds:  apixaban  2.5 mg Oral BID   Chlorhexidine Gluconate Cloth  6 each Topical Q0600   clopidogrel  75 mg Oral Daily   docusate  100 mg Oral BID   feeding supplement  237 mL Oral BID BM   multivitamin with minerals  1 tablet Oral Daily   mupirocin ointment  1 Application Nasal BID    PRN meds: HYDROcodone-acetaminophen, menthol-cetylpyridinium **OR** phenol, metoCLOPramide **OR** metoCLOPramide (REGLAN) injection, morphine injection, ondansetron **OR** ondansetron (ZOFRAN) IV, senna-docusate   Infusions:    Diet:  Diet Order             Diet regular Room service appropriate? No; Fluid consistency: Thin  Diet effective now                   Antimicrobials: Anti-infectives (From admission, onward)    Start     Dose/Rate Route Frequency Ordered Stop   10/29/22 1600  ceFAZolin (ANCEF) IVPB 2g/100 mL premix        2 g 200 mL/hr over 30 Minutes Intravenous Every 8 hours 10/29/22 1031 10/30/22 0046   10/29/22 0700  ceFAZolin (ANCEF) IVPB 2g/100 mL premix        2 g 200 mL/hr over 30 Minutes Intravenous To ShortStay Surgical 10/28/22 1959 10/29/22 0755   10/29/22 0625  ceFAZolin (ANCEF) 2-4 GM/100ML-% IVPB       Note to Pharmacy: Nyoka Cowden D: cabinet override      10/29/22 0625 10/29/22 0802       Skin assessment:  Pressure Injury 10/28/22 Sacrum (Active)  10/28/22 0200  Location: Sacrum  Location Orientation:   Staging:   Wound Description (Comments):   Present on Admission:       Nutritional status:  Body mass index is 23.92 kg/m.  Nutrition Problem: Increased  nutrient needs Etiology: hip fracture Signs/Symptoms: estimated needs     Objective: Vitals:   11/02/22 0452 11/02/22 0453  BP: 129/71   Pulse:    Resp: 15 18  Temp: 98.3 F (36.8 C)   SpO2: 96%    No intake or output data in the 24 hours ending 11/02/22 1103  Filed Weights   10/27/22 2201 10/29/22 0653  Weight: 61.2 kg 61.2 kg   Weight change:  Body mass index is 23.92 kg/m.   Physical Exam: General exam: Pleasant, elderly Caucasian female. Skin: No rashes, lesions or ulcers. HEENT: Atraumatic, normocephalic, no obvious bleeding Lungs: Clear to auscultation bilaterally CVS: Heart rate improved.  No murmur GI/Abd soft, nontender, nondistended, bowel sound present CNS: Alert, awake, oriented to place only Psychiatry: Cheerfully demented.  Not aggressive, not restless at the time of my evaluation Extremities: No pedal edema, no calf tenderness  Data Review: I have personally reviewed the laboratory data and studies available.  F/u labs ordered Unresulted Labs (From admission, onward)    None       Total time spent in review of labs and imaging, patient evaluation, formulation of plan, documentation and communication with family: 25 minutes  Signed, Terrilee Croak, MD [facility] 11/02/2022

## 2022-11-02 NOTE — Progress Notes (Signed)
Mobility Specialist Progress Note    11/02/22 1602  Mobility  Activity  (stood at chair)  Level of Assistance Moderate assist, patient does 50-74%  Assistive Device Front wheel walker;Other (Comment) (HHA)  LLE Weight Bearing WBAT  Activity Response Tolerated fair  Mobility Referral Yes  $Mobility charge 1 Mobility   Pre-Mobility: 107 HR During Mobility: 149 HR Post-Mobility: 115 HR  Pt received in chair and agreeable to practice standing. Completed x2 STS w/ RW modA+1. Pt LLE slipping forward. Completed x1 STS w/ HHA modA+1 and blocking LLE. Left in chair with call bell in reach and chair alarm on.   Hildred Alamin Mobility Specialist  Please Contact via SecureChat or Rehab Office at [phone]

## 2022-11-03 DIAGNOSIS — S72142A Displaced intertrochanteric fracture of left femur, initial encounter for closed fracture: Secondary | ICD-10-CM | POA: Diagnosis not present

## 2022-11-03 MED ORDER — ATENOLOL 25 MG PO TABS
12.5000 mg | ORAL_TABLET | Freq: Every day | ORAL | Status: DC
  Administered 2022-11-03 – 2022-11-07 (×5): 12.5 mg via ORAL
  Filled 2022-11-03 (×5): qty 1

## 2022-11-03 MED ORDER — POLYETHYLENE GLYCOL 3350 17 G PO PACK
17.0000 g | PACK | Freq: Every day | ORAL | Status: DC
  Administered 2022-11-03 – 2022-11-07 (×5): 17 g via ORAL
  Filled 2022-11-03 (×5): qty 1

## 2022-11-03 MED ORDER — BOOST / RESOURCE BREEZE PO LIQD CUSTOM
1.0000 | Freq: Three times a day (TID) | ORAL | Status: DC
  Administered 2022-11-03 – 2022-11-06 (×7): 1 via ORAL

## 2022-11-03 MED ORDER — SENNOSIDES-DOCUSATE SODIUM 8.6-50 MG PO TABS
1.0000 | ORAL_TABLET | Freq: Every day | ORAL | Status: DC
  Administered 2022-11-03 – 2022-11-06 (×4): 1 via ORAL
  Filled 2022-11-03 (×4): qty 1

## 2022-11-03 NOTE — Progress Notes (Signed)
Mobility Specialist Progress Note    11/03/22 1119  Mobility  Activity Ambulated with assistance to bathroom  Level of Assistance +2 (takes two people)  Games developer wheel walker  Distance Ambulated (ft) 20 ft (10+10)  LLE Weight Bearing WBAT  Activity Response Tolerated well  Mobility Referral Yes  $Mobility charge 1 Mobility   Pre-Mobility: 104 HR, 99% SpO2 During Mobility: 124 HR Post-Mobility: 110 HR  Pt received in chair and agreeable to work w/ MS and OT. C/o LLE pain. Pt minA+2 to stand w/ hip flexion and posterior lean. Took x1 seated rest break on BSC. Returned to chair with call bell in reach.   Hildred Alamin Mobility Specialist  Please Psychologist, sport and exercise or Rehab Office at [phone]

## 2022-11-03 NOTE — TOC Progression Note (Signed)
Transition of Care [facility]) - Progression Note    Patient Details  Name: Christy Sutton MRN: [id number] Date of Birth: March 02, 1926  Transition of Care [facility]) CM/SW Contact  Joanne Chars, LCSW Phone Number: 11/03/2022, 2:04 PM  Clinical Narrative:   Pt oriented x2. CSW spoke with son and daughter by phone.  They are aware of CIR denial, inagreement with plan to move forward with SNF.  Medicare choice document left in pt room, they will get it from there.     Expected Discharge Plan: Skilled Nursing Facility Barriers to Discharge: SNF Pending bed offer  Expected Discharge Plan and Services                                               Social Determinants of Health (SDOH) Interventions SDOH Screenings   Tobacco Use: Medium Risk (10/31/2022)    Readmission Risk Interventions     No data to display

## 2022-11-03 NOTE — Progress Notes (Signed)
  Inpatient Rehabilitation Admissions Coordinator   Insurance has denied CIR admit after peer to peer completed with MD at [facility] and [facility] care for [place] and Dr Naaman Plummer. I contacted pt's son and DTR in law by phone and they are aware. We are not appealing that decision. They ask that SW contact them with their SNF rehab options. I have alerted acute team and TOC and will sign off at this time.  Danne Baxter, RN, MSN Rehab Admissions Coordinator [phone] 11/03/2022 12:56 PM

## 2022-11-03 NOTE — Progress Notes (Signed)
Occupational Therapy Treatment Patient Details Name: Christy Sutton MRN: [id number] DOB: 1925/09/26 Today's Date: 11/03/2022   History of present illness Admitted after ground-level fall resulting in L hip fx, now post surgical fixation, WBAT;  has a past medical history of Atrial fibrillation ([place]), Diabetes mellitus without complication ([place]), Hyperlipidemia, Hypertension, Stroke ([place]), and TIA (transient ischemic attack).   OT comments  Pt progressing towards established OT goals. Focus session on transfers and ADL retraining. Pt performing ambulatory toilet transfer with min A +2 and mod A +2 up from [facility] due to poor anterior weight shift. Mod-max A to manage pants/underpants due to decr balance. Pt donning socks with AE with mod-max A for new learning and very hard of hearing. Pt very pleasant and cooperative. Recommending discharge to AIR to optimize safety and independence in ADL.    Recommendations for follow up therapy are one component of a multi-disciplinary discharge planning process, led by the attending physician.  Recommendations may be updated based on patient status, additional functional criteria and insurance authorization.    Follow Up Recommendations  Acute inpatient rehab (3hours/day)     Assistance Recommended at Discharge Frequent or constant Supervision/Assistance  Patient can return home with the following  Two people to help with walking and/or transfers;Two people to help with bathing/dressing/bathroom;Assistance with cooking/housework;Assist for transportation;Help with stairs or ramp for entrance;Direct supervision/assist for financial management;Direct supervision/assist for medications management   Equipment Recommendations  Other (comment) (defer)    Recommendations for Other Services Rehab consult    Precautions / Restrictions Precautions Precautions: Fall Precaution Comments: monitor BP-- improved Restrictions Weight Bearing Restrictions: Yes LLE Weight  Bearing: Weight bearing as tolerated       Mobility Bed Mobility               General bed mobility comments: in recliner on arrival and departure    Transfers Overall transfer level: Needs assistance Equipment used: Rolling walker (2 wheels) Transfers: Sit to/from Stand Sit to Stand: Min assist, +2 physical assistance, +2 safety/equipment, Mod assist           General transfer comment: Min A +2 up from recliner, but mod A up rom BSC due to poor anterior weight shift     Balance Overall balance assessment: Needs assistance Sitting-balance support: Bilateral upper extremity supported, Feet supported Sitting balance-Leahy Scale: Fair Sitting balance - Comments: Sitting in recliner   Standing balance support: Bilateral upper extremity supported, During functional activity Standing balance-Leahy Scale: Poor Standing balance comment: with RW support                           ADL either performed or assessed with clinical judgement   ADL Overall ADL's : Needs assistance/impaired     Grooming: Wash/dry face;Set up;Sitting               Lower Body Dressing: Maximal assistance;Sitting/lateral leans;With adaptive equipment Lower Body Dressing Details (indicate cue type and reason): educated regaridning use of sock aid and due to very hard of hearing, requiring tactile cues for new learning. Pt Mod A to don second sock and believe pt has a lot of room for progres with this method Toilet Transfer: Minimal assistance;+2 for physical assistance;+2 for safety/equipment;Ambulation;BSC/3in1;Rolling walker (2 wheels) Toilet Transfer Details (indicate cue type and reason): ambulated into restroom and onto [facility] Toileting- Clothing Manipulation and Hygiene: Maximal assistance;Sit to/from stand Toileting - Clothing Manipulation Details (indicate cue type and reason): to manage underpants  Functional mobility during ADLs: Minimal assistance;+2 for physical  assistance;+2 for safety/equipment;Rolling walker (2 wheels)      Extremity/Trunk Assessment Upper Extremity Assessment Upper Extremity Assessment: Generalized weakness   Lower Extremity Assessment Lower Extremity Assessment: Defer to PT evaluation        Vision       Perception     Praxis      Cognition Arousal/Alertness: Awake/alert Behavior During Therapy: WFL for tasks assessed/performed Overall Cognitive Status: No family/caregiver present to determine baseline cognitive functioning                                 General Comments: Pt is very HoH, but is able to follow commands well when able to hear them.        Exercises      Shoulder Instructions       General Comments HR up to 150s max    Pertinent Vitals/ Pain       Pain Assessment Pain Assessment: Faces Faces Pain Scale: Hurts little more Pain Location: L hip with getting up Pain Descriptors / Indicators: Grimacing, Guarding Pain Intervention(s): Limited activity within patient's tolerance, Monitored during session, Repositioned  Home Living                                          Prior Functioning/Environment              Frequency  Min 2X/week        Progress Toward Goals  OT Goals(current goals can now be found in the care plan section)  Progress towards OT goals: Progressing toward goals  Acute Rehab OT Goals Patient Stated Goal: none stated OT Goal Formulation: With patient Time For Goal Achievement: 11/15/22 Potential to Achieve Goals: Good ADL Goals Pt Will Perform Grooming: with min guard assist;standing Pt Will Perform Upper Body Bathing: with set-up;sitting Pt Will Perform Lower Body Bathing: with min guard assist;sitting/lateral leans Pt Will Perform Lower Body Dressing: with min assist;sit to/from stand Pt Will Transfer to Toilet: with min guard assist;stand pivot transfer;bedside commode  Plan Discharge plan remains  appropriate;Frequency remains appropriate    Co-evaluation                 AM-PAC OT "6 Clicks" Daily Activity     Outcome Measure   Help from another person eating meals?: None Help from another person taking care of personal grooming?: A Little Help from another person toileting, which includes using toliet, bedpan, or urinal?: A Lot Help from another person bathing (including washing, rinsing, drying)?: A Lot Help from another person to put on and taking off regular upper body clothing?: A Little Help from another person to put on and taking off regular lower body clothing?: A Lot 6 Click Score: 16    End of Session Equipment Utilized During Treatment: Gait belt;Rolling walker (2 wheels)  OT Visit Diagnosis: Unsteadiness on feet (R26.81);Other abnormalities of gait and mobility (R26.89);Muscle weakness (generalized) (M62.81);Other symptoms and signs involving cognitive function;Pain;History of falling (Z91.81) Pain - Right/Left: Left Pain - part of body: Hip   Activity Tolerance Patient tolerated treatment well   Patient Left in chair;with call bell/phone within reach;with chair alarm set   Nurse Communication Mobility status        Time: [id number] OT Time Calculation (min): 22 min  Charges: OT General Charges $OT Visit: 1 Visit OT Treatments $Self Care/Home Management : 8-22 mins  Elder Cyphers, OTR/L [facility] Acute Rehabilitation Office: [phone]   Magnus Ivan 11/03/2022, 12:30 PM

## 2022-11-03 NOTE — Care Management Important Message (Signed)
Important Message  Patient Details  Name: Christy Sutton MRN: [id number] Date of Birth: 03-13-1926   Medicare Important Message Given:  Yes     Hannah Beat 11/03/2022, 11:52 AM

## 2022-11-03 NOTE — Progress Notes (Signed)
PROGRESS NOTE  Christy Sutton  DOB: [birth year]  PCP: Donnajean Lopes, MD [id number]  DOA: 10/27/2022  LOS: 6 days  Hospital Day: 8  Brief narrative: Christy Sutton is a 89 y.o. female with PMH significant for DM2, HTN, HLD, A-fib, stroke with residual left-sided weakness 2/16, patient presented to the ED after a fall. Imaging showed left intertrochanteric fracture Admitted to [facility] 2/18, underwent intramedullary nailing of the fracture.  Subjective: Patient was seen and examined this morning.  Lying on recliner.  Not in distress.  No new symptoms.  Heart rate picking up again, 117 at rest at the time of my evaluation this morning  Assessment and plan: Closed intertrochanteric fracture of left femur S/p IM nailing 2/18 Pain control with tramadol as needed Eliquis resumed  Acute blood loss anemia Baseline hemoglobin normal at 12.2.  Noted drop in hemoglobin down to 7.1 on 2/20.  Monitor PRBC transfusion given with improvement in hemoglobin to 9.2.  Eliquis and Plavix were resumed on 2/21.  Recheck hemoglobin intermittently. Recent Labs    10/27/22 2200 10/28/22 0341 10/30/22 0458 10/31/22 0234 11/01/22 0227  HGB 12.2 11.7* 7.5* 7.1* 9.2*  MCV 93.0 93.7 93.5 92.0 87.7    Atrial fibrillation, chronic Not on any AV nodal blocking agent.   Heart rate running elevated consistently.  Chart reviewed.  It seems patient was previously on atenolol 25 mg daily.  I am not sure how long ago was at discontinued.  Will start on a low-dose of atenolol at 12.5 mg daily.  Continue to monitor in telemetry. PTA on Eliquis.  Was held for surgery.  Resumed postop.   History of CVA with residual left-sided weakness HLD Continue simvastatin.   Continue Eliquis and Plavix  Essential hypertension PTA on Lasix 20 mg twice daily.  Currently on hold   Type 2 diabetes mellitus A1c 5.7 on 10/28/2022 Not on antidiabetic meds Currently on sliding scale insulin with Accu-Cheks. Recent Labs  Lab  11/01/22 1555 11/01/22 2034 11/02/22 1118 11/02/22 1552 11/02/22 2131  GLUCAP 97 114* 113* 94 95    Mobility: PT eval obtained.  CIR denied by insurance.  Family appeal versus SNF  Goals of care   Code Status: DNR     DVT prophylaxis:  apixaban (ELIQUIS) tablet 2.5 mg Start: 11/01/22 1530 SCDs Start: 10/29/22 1032 SCDs Start: 10/28/22 0114 apixaban (ELIQUIS) tablet 2.5 mg   Antimicrobials: None Fluid: None Consultants: Orthopedics Family Communication: Family not at bedside  Status is: Inpatient Level of care: Telemetry Medical   Dispo: Patient is from: Home              Anticipated d/c is to: CIR denied.  SNF pending.  Medically stable for discharge  Scheduled Meds:  apixaban  2.5 mg Oral BID   atenolol  12.5 mg Oral Daily   clopidogrel  75 mg Oral Daily   docusate  100 mg Oral BID   feeding supplement  237 mL Oral BID BM   multivitamin with minerals  1 tablet Oral Daily    PRN meds: HYDROcodone-acetaminophen, menthol-cetylpyridinium **OR** phenol, metoCLOPramide **OR** metoCLOPramide (REGLAN) injection, morphine injection, ondansetron **OR** ondansetron (ZOFRAN) IV, senna-docusate   Infusions:    Diet:  Diet Order             Diet regular Room service appropriate? No; Fluid consistency: Thin  Diet effective now                   Antimicrobials: Anti-infectives (From admission, onward)  Start     Dose/Rate Route Frequency Ordered Stop   10/29/22 1600  ceFAZolin (ANCEF) IVPB 2g/100 mL premix        2 g 200 mL/hr over 30 Minutes Intravenous Every 8 hours 10/29/22 1031 10/30/22 0046   10/29/22 0700  ceFAZolin (ANCEF) IVPB 2g/100 mL premix        2 g 200 mL/hr over 30 Minutes Intravenous To ShortStay Surgical 10/28/22 1959 10/29/22 0755   10/29/22 0625  ceFAZolin (ANCEF) 2-4 GM/100ML-% IVPB       Note to Pharmacy: Nyoka Cowden D: cabinet override      10/29/22 0625 10/29/22 0802       Skin assessment:  Pressure Injury 10/28/22 Sacrum  (Active)  10/28/22 0200  Location: Sacrum  Location Orientation:   Staging:   Wound Description (Comments):   Present on Admission:       Nutritional status:  Body mass index is 23.92 kg/m.  Nutrition Problem: Increased nutrient needs Etiology: hip fracture Signs/Symptoms: estimated needs     Objective: Vitals:   11/02/22 0453 11/03/22 0757  BP:  (!) 148/122  Pulse:  97  Resp: 18 17  Temp:  98.1 F (36.7 C)  SpO2:  96%   No intake or output data in the 24 hours ending 11/03/22 1256  Filed Weights   10/27/22 2201 10/29/22 0653  Weight: 61.2 kg 61.2 kg   Weight change:  Body mass index is 23.92 kg/m.   Physical Exam: General exam: Pleasant, elderly Caucasian female. Skin: No rashes, lesions or ulcers. HEENT: Atraumatic, normocephalic, no obvious bleeding Lungs: Clear to auscultation bilaterally CVS: Tachycardic.  No murmur GI/Abd soft, nontender, nondistended, bowel sound present CNS: Alert, awake, oriented to place only Psychiatry: Cheerfully demented.  Not aggressive, not restless at the time of my evaluation Extremities: No pedal edema, no calf tenderness  Data Review: I have personally reviewed the laboratory data and studies available.  F/u labs ordered Unresulted Labs (From admission, onward)    None       Total time spent in review of labs and imaging, patient evaluation, formulation of plan, documentation and communication with family: 71 minutes  Signed, Terrilee Croak, MD [facility] 11/03/2022

## 2022-11-03 NOTE — [place] FL2 (Signed)
[place] MEDICAID FL2 LEVEL OF CARE FORM     IDENTIFICATION  Patient Name: Christy Sutton Birthdate: [birth year] Sex: female Admission Date (Current Location): 10/27/2022  [facility] and [place] Number:  Herbalist and Address:  [facility] [place]. [facility] [place] [address] [place], [place] [address]      Provider Number: [id number]  Attending Physician Name and Address:  Terrilee Croak, MD  Relative Name and Phone Number:  Delaurentis,Laurie Daughter   [phone]    Current Level of Care: Hospital Recommended Level of Care: [place] Prior Approval Number:    Date Approved/Denied:   PASRR Number: [id number]  Discharge Plan: SNF    Current Diagnoses: Patient Active Problem List   Diagnosis Date Noted   Fall 11/01/2022   Closed intertrochanteric fracture of left femur, initial encounter ([place]) 10/28/2022   Bilateral leg edema 04/20/2017   Atrial fibrillation, chronic ([place]) 04/20/2017   Left-sided weakness 04/20/2017   Hyperlipidemia associated with type 2 diabetes mellitus ([place]) 04/04/2017   Left hemiparesis ([place])    Acute blood loss anemia    Benign essential HTN    Hypokalemia    Hypoalbuminemia due to protein-calorie malnutrition ([place])    Stroke due to embolism ([place]) 03/09/2017   Diabetes mellitus type 2 in nonobese [facility])    History of CVA (cerebrovascular accident)    Cerebral embolism with cerebral infarction 03/07/2017   Stroke ([place]) 01/17/2017   TIA (transient ischemic attack) 01/17/2017   Dysmetria 01/17/2017   Diabetes mellitus with complication (HCC) 0000000   Ischemic stroke (HCC)     Orientation RESPIRATION BLADDER Height & Weight     Self, Place  Normal Incontinent Weight: 135 lb (61.2 kg) Height:  5' 2.99" (160 cm)  BEHAVIORAL SYMPTOMS/MOOD NEUROLOGICAL BOWEL NUTRITION STATUS      Continent Diet (see discharge summary)  AMBULATORY STATUS COMMUNICATION OF NEEDS Skin   Limited Assist Verbally Surgical wounds                        Personal Care Assistance Level of Assistance  Bathing, Feeding, Dressing Bathing Assistance: Maximum assistance Feeding assistance: Limited assistance Dressing Assistance: Maximum assistance     Functional Limitations Info  Sight, Hearing, Speech Sight Info: Adequate Hearing Info: Impaired Speech Info: Adequate    SPECIAL CARE FACTORS FREQUENCY  PT (By licensed PT), OT (By licensed OT)     PT Frequency: 5x week OT Frequency: 5x week            Contractures Contractures Info: Not present    Additional Factors Info  Code Status, Allergies Code Status Info: DNR Allergies Info: penicillins           Current Medications (11/03/2022):  This is the current hospital active medication list Current Facility-Administered Medications  Medication Dose Route Frequency Provider Last Rate Last Admin   apixaban (ELIQUIS) tablet 2.5 mg  2.5 mg Oral BID Dahal, Binaya, MD   2.5 mg at 11/03/22 0950   atenolol (TENORMIN) tablet 12.5 mg  12.5 mg Oral Daily Dahal, Binaya, MD   12.5 mg at 11/03/22 0950   clopidogrel (PLAVIX) tablet 75 mg  75 mg Oral Daily Dahal, Marlowe Aschoff, MD   75 mg at 11/03/22 0951   docusate (COLACE) 50 MG/5ML liquid 100 mg  100 mg Oral BID Terrilee Croak, MD   100 mg at 11/03/22 0951   feeding supplement (ENSURE ENLIVE / ENSURE PLUS) liquid 237 mL  237 mL Oral BID BM Stann Mainland,  Elly Modena, MD   237 mL at 11/03/22 0951   HYDROcodone-acetaminophen (NORCO/VICODIN) 5-325 MG per tablet 1-2 tablet  1-2 tablet Oral Q6H PRN Nicholes Stairs, MD   1 tablet at 10/30/22 2012   menthol-cetylpyridinium (CEPACOL) lozenge 3 mg  1 lozenge Oral PRN Nicholes Stairs, MD       Or   phenol [facility]) mouth spray 1 spray  1 spray Mouth/Throat PRN Nicholes Stairs, MD       metoCLOPramide [facility]) tablet 5-10 mg  5-10 mg Oral Q8H PRN Nicholes Stairs, MD       Or   metoCLOPramide (REGLAN) injection 5-10 mg  5-10 mg Intravenous Q8H PRN Nicholes Stairs,  MD       morphine (PF) 2 MG/ML injection 0.5 mg  0.5 mg Intravenous Q2H PRN Nicholes Stairs, MD       multivitamin with minerals tablet 1 tablet  1 tablet Oral Daily Nicholes Stairs, MD   1 tablet at 11/03/22 0951   ondansetron [facility]) tablet 4 mg  4 mg Oral Q6H PRN Nicholes Stairs, MD       Or   ondansetron [facility]) injection 4 mg  4 mg Intravenous Q6H PRN Nicholes Stairs, MD       senna-docusate (Senokot-S) tablet 1 tablet  1 tablet Oral QHS PRN Nicholes Stairs, MD   1 tablet at 10/30/22 2012     Discharge Medications: Please see discharge summary for a list of discharge medications.  Relevant Imaging Results:  Relevant Lab Results:   Additional Information SSN: [id number].  Pt is vaccinated for covid with at least one booster.  Joanne Chars, LCSW

## 2022-11-03 NOTE — Progress Notes (Signed)
Nutrition Follow-up  DOCUMENTATION CODES:   Non-severe (moderate) malnutrition in context of chronic illness  INTERVENTION:  Continue regular diet as ordered Feeding with assistance Discontinue Ensure Magic cup TID with meals, each supplement provides 290 kcal and 9 grams of protein Boost Breeze po TID, each supplement provides 250 kcal and 9 grams of protein MVI with minerals daily  NUTRITION DIAGNOSIS:  Moderate Malnutrition related to chronic illness as evidenced by severe fat depletion, moderate muscle depletion  GOAL:  Patient will meet greater than or equal to 90% of their needs - goal unmet  MONITOR:  PO intake, Labs, Weight trends  REASON FOR ASSESSMENT:  Consult Hip fracture protocol  ASSESSMENT:  89 y.o. female presented to the ED with hip pain after a fall. PMH includes T2DM, HTN, HLD, CVA with residual L-sided weakness, hard of hearing, and A. Fib. Pt admitted with L closed intertrochanteric femur fracture.  2/18 s/p IMN L femur  Per flowsheet documentation, pt noted to be oriented x2. Pt denied by Cir. Pending SNF placement.   Pt is very hard of hearing. Sitting up in chair at time of visit. No family present. Breakfast tray on table, none eaten. Pt asking for Pepsi. Helped pt drink Cola on bedside table. She states she is waiting for family to come to eat breakfast. Denies wanting assistance eating anything at this time. Ensure has been ordered for patient however it sounds as though she is only taking sips. Per RN documentation today, she took a sip and asked for it to be taken away as she does not like it. Will change order to see if there is a different supplement she may enjoy better.   Meal completions: 02/17: 0% lunch 2/20: 50% breakfast, 50% lunch 2/23: 0% breakfast  Medications: colace, MVI  Labs reviewed  NUTRITION - FOCUSED PHYSICAL EXAM:  Flowsheet Row Most Recent Value  Orbital Region Severe depletion  Upper Arm Region Severe depletion   Thoracic and Lumbar Region Moderate depletion  Buccal Region Severe depletion  Temple Region Moderate depletion  Clavicle Bone Region Moderate depletion  Clavicle and Acromion Bone Region Moderate depletion  Scapular Bone Region Moderate depletion  Dorsal Hand Moderate depletion  Patellar Region Moderate depletion  Anterior Thigh Region Moderate depletion  Posterior Calf Region Severe depletion  Edema (RD Assessment) None  Hair Reviewed  Eyes Reviewed  Mouth Other (Comment)  [poor dentition]  Skin Reviewed  Nails Reviewed       Diet Order:   Diet Order             Diet regular Room service appropriate? No; Fluid consistency: Thin  Diet effective now                   EDUCATION NEEDS:   No education needs have been identified at this time  Skin:  Skin Assessment: Reviewed RN Assessment (sacrum PI, L hip incision (closed))  Last BM:  unknown  Height:   Ht Readings from Last 1 Encounters:  10/29/22 5' 2.99" (1.6 m)    Weight:   Wt Readings from Last 1 Encounters:  10/29/22 61.2 kg    Ideal Body Weight:  52.3 kg  BMI:  Body mass index is 23.92 kg/m.  Estimated Nutritional Needs:   Kcal:  1600-1800  Protein:  80-100 grams  Fluid:  >/= 1.6 L  Clayborne Dana, RDN, LDN Clinical Nutrition

## 2022-11-04 DIAGNOSIS — E440 Moderate protein-calorie malnutrition: Secondary | ICD-10-CM | POA: Insufficient documentation

## 2022-11-04 DIAGNOSIS — S72142A Displaced intertrochanteric fracture of left femur, initial encounter for closed fracture: Secondary | ICD-10-CM | POA: Diagnosis not present

## 2022-11-04 NOTE — Progress Notes (Signed)
PROGRESS NOTE  Christy Sutton  DOB: 1926-06-14  PCP: Donnajean Lopes, MD [id number]  DOA: 10/27/2022  LOS: 7 days  Hospital Day: 9  Brief narrative: Christy Sutton is a 89 y.o. female with PMH significant for DM2, HTN, HLD, A-fib, stroke with residual left-sided weakness 2/16, patient presented to the ED after a fall. Imaging showed left intertrochanteric fracture Admitted to [facility] 2/18, underwent intramedullary nailing of the fracture.  Subjective: Patient was seen and examined this morning.   Resting in recliner.  Not in distress.  Heart rate improved after atenolol was started yesterday.    Assessment and plan: Closed intertrochanteric fracture of left femur S/p IM nailing 2/18 Pain control with tramadol as needed Eliquis resumed  Acute blood loss anemia Baseline hemoglobin normal at 12.2.  Noted drop in hemoglobin down to 7.1 on 2/20.  Monitor PRBC transfusion given with improvement in hemoglobin to 9.2.  Eliquis and Plavix were resumed on 2/21.  Recheck hemoglobin intermittently. Recent Labs    10/27/22 2200 10/28/22 0341 10/30/22 0458 10/31/22 0234 11/01/22 0227  HGB 12.2 11.7* 7.5* 7.1* 9.2*  MCV 93.0 93.7 93.5 92.0 87.7    Atrial fibrillation, chronic PTA, patient was not on any AV nodal blocking agent.   Heart rate postprocedure continue to run elevated.  Chart reviewed. It seems patient was previously on atenolol 25 mg daily.  I am not sure how long ago was at discontinued.   2/23, I started her on a low-dose of atenolol at 12.5 mg daily.  Heart rate improved. Continue anticoagulation with Eliquis.   History of CVA with residual left-sided weakness HLD Continue simvastatin.   Continue Eliquis and Plavix  Essential hypertension PTA on Lasix 20 mg twice daily.  Currently on hold   Type 2 diabetes mellitus A1c 5.7 on 10/28/2022 Not on antidiabetic meds Currently on sliding scale insulin with Accu-Cheks. Recent Labs  Lab 11/01/22 1555 11/01/22 2034  11/02/22 1118 11/02/22 1552 11/02/22 2131  GLUCAP 97 114* 113* 94 95    Mobility: PT eval obtained.  CIR denied by insurance.  Family appeal versus SNF  Goals of care   Code Status: DNR     DVT prophylaxis:  apixaban (ELIQUIS) tablet 2.5 mg Start: 11/01/22 1530 SCDs Start: 10/29/22 1032 SCDs Start: 10/28/22 0114 apixaban (ELIQUIS) tablet 2.5 mg   Antimicrobials: None Fluid: None Consultants: Orthopedics Family Communication: Family not at bedside  Status is: Inpatient Level of care: Telemetry Medical   Dispo: Patient is from: Home              Anticipated d/c is to: CIR denied.  SNF pending.  Medically stable for discharge  Scheduled Meds:  apixaban  2.5 mg Oral BID   atenolol  12.5 mg Oral Daily   clopidogrel  75 mg Oral Daily   docusate  100 mg Oral BID   feeding supplement  1 Container Oral TID BM   multivitamin with minerals  1 tablet Oral Daily   polyethylene glycol  17 g Oral Daily   senna-docusate  1 tablet Oral QHS    PRN meds: HYDROcodone-acetaminophen, menthol-cetylpyridinium **OR** phenol, metoCLOPramide **OR** metoCLOPramide (REGLAN) injection, morphine injection, ondansetron **OR** ondansetron (ZOFRAN) IV   Infusions:    Diet:  Diet Order             Diet regular Room service appropriate? No; Fluid consistency: Thin  Diet effective now                   Antimicrobials:  Anti-infectives (From admission, onward)    Start     Dose/Rate Route Frequency Ordered Stop   10/29/22 1600  ceFAZolin (ANCEF) IVPB 2g/100 mL premix        2 g 200 mL/hr over 30 Minutes Intravenous Every 8 hours 10/29/22 1031 10/30/22 0046   10/29/22 0700  ceFAZolin (ANCEF) IVPB 2g/100 mL premix        2 g 200 mL/hr over 30 Minutes Intravenous To ShortStay Surgical 10/28/22 1959 10/29/22 0755   10/29/22 0625  ceFAZolin (ANCEF) 2-4 GM/100ML-% IVPB       Note to Pharmacy: Nyoka Cowden D: cabinet override      10/29/22 0625 10/29/22 0802       Skin assessment:   Pressure Injury 10/28/22 Sacrum (Active)  10/28/22 0200  Location: Sacrum  Location Orientation:   Staging:   Wound Description (Comments):   Present on Admission:       Nutritional status:  Body mass index is 23.92 kg/m.  Nutrition Problem: Moderate Malnutrition Etiology: chronic illness Signs/Symptoms: severe fat depletion, moderate muscle depletion     Objective: Vitals:   11/04/22 0403 11/04/22 0726  BP: 113/89 119/70  Pulse: 62 95  Resp: 18 19  Temp: 98.2 F (36.8 C) 98.4 F (36.9 C)  SpO2: 97% 97%    Intake/Output Summary (Last 24 hours) at 11/04/2022 1149 Last data filed at 11/04/2022 0900 Gross per 24 hour  Intake 460 ml  Output --  Net 460 ml    Filed Weights   10/27/22 2201 10/29/22 0653  Weight: 61.2 kg 61.2 kg   Weight change:  Body mass index is 23.92 kg/m.   Physical Exam: General exam: Pleasant, elderly Caucasian female. Skin: No rashes, lesions or ulcers. HEENT: Atraumatic, normocephalic, no obvious bleeding Lungs: Clear to auscultation bilaterally CVS: Tachycardic.  No murmur GI/Abd soft, nontender, nondistended, bowel sound present CNS: Alert, awake, oriented to place only Psychiatry: Cheerfully demented.  Not aggressive, not restless at the time of my evaluation Extremities: No pedal edema, no calf tenderness  Data Review: I have personally reviewed the laboratory data and studies available.  F/u labs ordered Unresulted Labs (From admission, onward)    None       Total time spent in review of labs and imaging, patient evaluation, formulation of plan, documentation and communication with family: 25 minutes  Signed, Terrilee Croak, MD [facility] 11/04/2022

## 2022-11-04 NOTE — Progress Notes (Signed)
Mobility Specialist - Progress Note   11/04/22 1341  Mobility  Activity Stood at bedside;Transferred from bed to chair (Sit>Stand x3)  Level of Assistance Moderate assist, patient does 50-74%  Assistive Device Front wheel walker  Distance Ambulated (ft) 3 ft  LLE Weight Bearing WBAT  Activity Response Tolerated well  Mobility Referral Yes  $Mobility charge 1 Mobility    Pt received in bed agreeable to mobility. Performed STS x3 at bedside requiring ModA d/t posterior lean. Requested to transfer to recliner, [place] to pivot and directional cues needed. Left in recliner w/ chair alarm on and call bell in her lap.   [place] Specialist Please contact via SecureChat or Rehab office at [phone]

## 2022-11-05 DIAGNOSIS — S72142A Displaced intertrochanteric fracture of left femur, initial encounter for closed fracture: Secondary | ICD-10-CM | POA: Diagnosis not present

## 2022-11-05 LAB — CBC WITH DIFFERENTIAL/PLATELET
Abs Immature Granulocytes: 0.01 10*3/uL (ref 0.00–0.07)
Basophils Absolute: 0 10*3/uL (ref 0.0–0.1)
Basophils Relative: 0 %
Eosinophils Absolute: 0.2 10*3/uL (ref 0.0–0.5)
Eosinophils Relative: 3 %
HCT: 28 % — ABNORMAL LOW (ref 36.0–46.0)
Hemoglobin: 9.5 g/dL — ABNORMAL LOW (ref 12.0–15.0)
Immature Granulocytes: 0 %
Lymphocytes Relative: 9 %
Lymphs Abs: 0.5 10*3/uL — ABNORMAL LOW (ref 0.7–4.0)
MCH: 30.8 pg (ref 26.0–34.0)
MCHC: 33.9 g/dL (ref 30.0–36.0)
MCV: 90.9 fL (ref 80.0–100.0)
Monocytes Absolute: 0.5 10*3/uL (ref 0.1–1.0)
Monocytes Relative: 8 %
Neutro Abs: 5 10*3/uL (ref 1.7–7.7)
Neutrophils Relative %: 80 %
Platelets: 211 10*3/uL (ref 150–400)
RBC: 3.08 MIL/uL — ABNORMAL LOW (ref 3.87–5.11)
RDW: 15.1 % (ref 11.5–15.5)
WBC: 6.2 10*3/uL (ref 4.0–10.5)
nRBC: 0 % (ref 0.0–0.2)

## 2022-11-05 LAB — BASIC METABOLIC PANEL
Anion gap: 11 (ref 5–15)
BUN: 10 mg/dL (ref 8–23)
CO2: 23 mmol/L (ref 22–32)
Calcium: 8 mg/dL — ABNORMAL LOW (ref 8.9–10.3)
Chloride: 106 mmol/L (ref 98–111)
Creatinine, Ser: 0.65 mg/dL (ref 0.44–1.00)
GFR, Estimated: >60 mL/min (ref >60)
Glucose, Bld: 97 mg/dL (ref 70–99)
Potassium: 3.3 mmol/L — ABNORMAL LOW (ref 3.5–5.1)
Sodium: 140 mmol/L (ref 135–145)

## 2022-11-05 MED ORDER — POTASSIUM CHLORIDE CRYS ER 20 MEQ PO TBCR
40.0000 meq | EXTENDED_RELEASE_TABLET | ORAL | Status: AC
  Administered 2022-11-05 (×2): 40 meq via ORAL
  Filled 2022-11-05 (×2): qty 2

## 2022-11-05 NOTE — Progress Notes (Signed)
Orthopedics Progress Note  Subjective: Patient states that she is comfortable and not in pain this AM  Objective:  Vitals:   11/05/22 0218 11/05/22 0812  BP: 109/68 104/60  Pulse: 100 96  Resp: 18 18  Temp: 98.2 F (36.8 C) 98 F (36.7 C)  SpO2: 99% 98%    General: Awake and alert  Musculoskeletal: Left hip incisions covered with Aquacel bandage. Minimal swelling Neurovascularly intact, moves feet easily with no pain   Lab Results  Component Value Date   WBC 6.2 11/05/2022   HGB 9.5 (L) 11/05/2022   HCT 28.0 (L) 11/05/2022   MCV 90.9 11/05/2022   PLT 211 11/05/2022       Component Value Date/Time   NA 140 11/05/2022 0332   K 3.3 (L) 11/05/2022 0332   CL 106 11/05/2022 0332   CO2 23 11/05/2022 0332   GLUCOSE 97 11/05/2022 0332   BUN 10 11/05/2022 0332   CREATININE 0.65 11/05/2022 0332   CALCIUM 8.0 (L) 11/05/2022 0332   GFRNONAA >60 11/05/2022 0332   GFRAA >60 04/20/2017 1343    Lab Results  Component Value Date   INR 1.7 (H) 10/27/2022   INR 2.1 (H) 03/14/2021   INR 1.81 04/20/2017    Assessment/Plan: POD #7 s/p Procedure(s): INTRAMEDULLARY (IM) NAIL INTERTROCHANTERIC Stable from ortho standpoint, continue mobilization D/C planning  Remo Lipps R. Veverly Fells, MD 11/05/2022 8:26 AM

## 2022-11-05 NOTE — TOC Progression Note (Signed)
Transition of Care [facility]) - Progression Note    Patient Details  Name: Christy Sutton MRN: [id number] Date of Birth: 06-04-1926  [facility]) CM/SW Contact  Rodney Booze, LCSW Phone Number: 11/05/2022, 2:58 PM  Clinical Narrative:    CSW spoke with the daughter, they have accepted [facility]. CSW called Tracey left VM for her to follow up with CSW Marya Amsler in the AM.  TOC will continue to follow.  Expected Discharge Plan: Skilled Nursing Facility Barriers to Discharge: SNF Pending bed offer  Expected Discharge Plan and Services                                               Social Determinants of Health (SDOH) Interventions SDOH Screenings   Tobacco Use: Medium Risk (10/31/2022)    Readmission Risk Interventions     No data to display

## 2022-11-05 NOTE — Progress Notes (Signed)
PROGRESS NOTE  Christy Sutton  DOB: 11/13/1925  PCP: Donnajean Lopes, MD [id number]  DOA: 10/27/2022  LOS: 8 days  Hospital Day: 10  Brief narrative: Christy Sutton is a 89 y.o. female with PMH significant for DM2, HTN, HLD, A-fib, stroke with residual left-sided weakness 2/16, patient presented to the ED after a fall. Imaging showed left intertrochanteric fracture Admitted to [facility] 2/18, underwent intramedullary nailing of the fracture.  Subjective: Patient was seen and examined this morning.  Resting in recliner.  Not in distress.  CIR denied.  SNF recommended.  Pending SNF at this time.  Heart rate improved.  Blood pressure stable.  Assessment and plan: Closed intertrochanteric fracture of left femur S/p IM nailing 2/18 Impaired mobility Pain control with tramadol as needed Eliquis resumed PT eval obtained.  CIR recommended  Acute blood loss anemia Baseline hemoglobin normal at 12.2.  Noted drop in hemoglobin down to 7.1 on 2/20.  1 unit of PRBC transfusion given with improvement in hemoglobin to 9.2.  Eliquis and Plavix were resumed on 2/21.  Repeat hemoglobin this morning is stable. Recent Labs    10/28/22 0341 10/30/22 0458 10/31/22 0234 11/01/22 0227 11/05/22 0332  HGB 11.7* 7.5* 7.1* 9.2* 9.5*  MCV 93.7 93.5 92.0 87.7 90.9    Atrial fibrillation, chronic PTA, patient was not on any AV nodal blocking agent.   Heart rate postprocedure continue to run elevated.  Chart reviewed. It seems patient was previously on atenolol 25 mg daily.  I am not sure how long ago was at discontinued.   2/23, I started her on a low-dose of atenolol at 12.5 mg daily.  Heart rate is improved.  Blood pressure is stable. Continue long-term anticoagulation with Eliquis.   History of CVA with residual left-sided weakness HLD Continue simvastatin.   Continue Eliquis and Plavix  Essential hypertension PTA on Lasix 20 mg twice daily.  Currently on hold.  Blood pressure stable.  Volume  status stable.   Type 2 diabetes mellitus A1c 5.7 on 10/28/2022 Not on antidiabetic meds Currently on sliding scale insulin with Accu-Cheks. Recent Labs  Lab 11/01/22 1555 11/01/22 2034 11/02/22 1118 11/02/22 1552 11/02/22 2131  GLUCAP 97 114* 113* 94 95    Goals of care   Code Status: DNR     DVT prophylaxis:  apixaban (ELIQUIS) tablet 2.5 mg Start: 11/01/22 1530 SCDs Start: 10/29/22 1032 SCDs Start: 10/28/22 0114 apixaban (ELIQUIS) tablet 2.5 mg   Antimicrobials: None Fluid: None Consultants: Orthopedics Family Communication: Family not at bedside  Status is: Inpatient Level of care: Telemetry Medical   Dispo: Patient is from: Home              Anticipated d/c is to: CIR denied.  SNF pending.  Medically stable for discharge  Scheduled Meds:  apixaban  2.5 mg Oral BID   atenolol  12.5 mg Oral Daily   clopidogrel  75 mg Oral Daily   docusate  100 mg Oral BID   feeding supplement  1 Container Oral TID BM   multivitamin with minerals  1 tablet Oral Daily   polyethylene glycol  17 g Oral Daily   senna-docusate  1 tablet Oral QHS    PRN meds: HYDROcodone-acetaminophen, menthol-cetylpyridinium **OR** phenol, metoCLOPramide **OR** metoCLOPramide (REGLAN) injection, morphine injection, ondansetron **OR** ondansetron (ZOFRAN) IV   Infusions:    Diet:  Diet Order             Diet regular Room service appropriate? No; Fluid consistency: Thin  Diet  effective now                   Antimicrobials: Anti-infectives (From admission, onward)    Start     Dose/Rate Route Frequency Ordered Stop   10/29/22 1600  ceFAZolin (ANCEF) IVPB 2g/100 mL premix        2 g 200 mL/hr over 30 Minutes Intravenous Every 8 hours 10/29/22 1031 10/30/22 0046   10/29/22 0700  ceFAZolin (ANCEF) IVPB 2g/100 mL premix        2 g 200 mL/hr over 30 Minutes Intravenous To ShortStay Surgical 10/28/22 1959 10/29/22 0755   10/29/22 0625  ceFAZolin (ANCEF) 2-4 GM/100ML-% IVPB       Note  to Pharmacy: Nyoka Cowden D: cabinet override      10/29/22 0625 10/29/22 0802       Skin assessment:  Pressure Injury 10/28/22 Sacrum (Active)  10/28/22 0200  Location: Sacrum  Location Orientation:   Staging:   Wound Description (Comments):   Present on Admission:       Nutritional status:  Body mass index is 23.92 kg/m.  Nutrition Problem: Moderate Malnutrition Etiology: chronic illness Signs/Symptoms: severe fat depletion, moderate muscle depletion     Objective: Vitals:   11/05/22 0218 11/05/22 0812  BP: 109/68 104/60  Pulse: 100 96  Resp: 18 18  Temp: 98.2 F (36.8 C) 98 F (36.7 C)  SpO2: 99% 98%   No intake or output data in the 24 hours ending 11/05/22 1400   Filed Weights   10/27/22 2201 10/29/22 0653  Weight: 61.2 kg 61.2 kg   Weight change:  Body mass index is 23.92 kg/m.   Physical Exam: General exam: Pleasant, elderly Caucasian female. Skin: No rashes, lesions or ulcers. HEENT: Atraumatic, normocephalic, no obvious bleeding Lungs: Clear to auscultation bilaterally CVS: Tachycardic.  No murmur GI/Abd soft, nontender, nondistended, bowel sound present CNS: Alert, awake, oriented to place only Psychiatry: Cheerfully demented.  Not aggressive, not restless at the time of my evaluation Extremities: No pedal edema, no calf tenderness  Data Review: I have personally reviewed the laboratory data and studies available.  F/u labs ordered Unresulted Labs (From admission, onward)    None       Total time spent in review of labs and imaging, patient evaluation, formulation of plan, documentation and communication with family: 25 minutes  Signed, Terrilee Croak, MD [facility] 11/05/2022

## 2022-11-06 ENCOUNTER — Encounter (HOSPITAL_COMMUNITY): Payer: Self-pay | Admitting: Orthopedic Surgery

## 2022-11-06 DIAGNOSIS — S72142A Displaced intertrochanteric fracture of left femur, initial encounter for closed fracture: Secondary | ICD-10-CM | POA: Diagnosis not present

## 2022-11-06 NOTE — TOC Progression Note (Signed)
Transition of Care [facility]) - Initial/Assessment Note    Patient Details  Name: Loyola Imperato MRN: [id number] Date of Birth: 08-16-1926  Transition of Care [facility]) CM/SW Contact:    Milinda Antis, [place] Phone Number: 11/06/2022, 12:53 PM  Clinical Narrative:                 Insurance authorization submitted.   Auth ID= [id number]   TOC following.  Expected Discharge Plan: Skilled Nursing Facility Barriers to Discharge: SNF Pending bed offer   Patient Goals and CMS Choice   CMS Medicare.gov Compare Post Acute Care list provided to:: Other (Comment Required) (son and daughter) Choice offered to / list presented to : Adult Children      Expected Discharge Plan and Services                                              Prior Living Arrangements/Services     Patient language and need for interpreter reviewed:: Yes        Need for Family Participation in Patient Care: Yes (Comment) Care giver support system in place?: Yes (comment)   Criminal Activity/Legal Involvement Pertinent to Current Situation/Hospitalization: No - Comment as needed  Activities of Daily Living      Permission Sought/Granted                  Emotional Assessment Appearance:: Appears stated age     Orientation: : Oriented to Self, Oriented to Place Alcohol / Substance Use: Not Applicable Psych Involvement: No (comment)  Admission diagnosis:  Closed intertrochanteric fracture of left femur, initial encounter ([place]) [S72.142A] Closed fracture of left hip, initial encounter ([place]) [S72.002A] Fall, initial encounter [W19.XXXA] Patient Active Problem List   Diagnosis Date Noted   Malnutrition of moderate degree 11/04/2022   Fall 11/01/2022   Closed intertrochanteric fracture of left femur, initial encounter ([place]) 10/28/2022   Bilateral leg edema 04/20/2017   Atrial fibrillation, chronic ([place]) 04/20/2017   Left-sided weakness 04/20/2017   Hyperlipidemia associated with type 2  diabetes mellitus ([place]) 04/04/2017   Left hemiparesis ([place])    Acute blood loss anemia    Benign essential HTN    Hypokalemia    Hypoalbuminemia due to protein-calorie malnutrition ([place])    Stroke due to embolism ([place]) 03/09/2017   Diabetes mellitus type 2 in nonobese [facility])    History of CVA (cerebrovascular accident)    Cerebral embolism with cerebral infarction 03/07/2017   Stroke ([place]) 01/17/2017   TIA (transient ischemic attack) 01/17/2017   Dysmetria 01/17/2017   Diabetes mellitus with complication ([place]) 0000000   Ischemic stroke ([place])    PCP:  Donnajean Lopes, MD Pharmacy:   [facility] ([place]), [place] [id number] [place] ([place]) [place] [address] Phone: [phone] Fax: [phone]     Social Determinants of Health (SDOH) Social History: SDOH Screenings   Tobacco Use: Medium Risk (11/06/2022)   SDOH Interventions:     Readmission Risk Interventions     No data to display

## 2022-11-06 NOTE — Progress Notes (Signed)
Physical Therapy Treatment Patient Details Name: Christy Sutton MRN: [id number] DOB: 10/10/1925 Today's Date: 11/06/2022   History of Present Illness Admitted after ground-level fall resulting in L hip fx, now post surgical fixation, WBAT;  has a past medical history of Atrial fibrillation ([place]), Diabetes mellitus without complication ([place]), Hyperlipidemia, Hypertension, Stroke ([place]), and TIA (transient ischemic attack).    PT Comments    Continuing work on functional mobility and activity tolerance;  Session focused on bed mobility and functional transfers, showing slow, but notable progress in sit<>stand with RW; Needs continuin gwork on leading sit to stand with anterior weight shift, and keeping weight over feet with taking steps; Very likely that pt knew the chair was behind her and went to sit prematurely, and that caused her feet to slip forward, and the need for Max assis tt control descent to sit; Updated DC plan as AIR will not be covered by insurance   Recommendations for follow up therapy are one component of a multi-disciplinary discharge planning process, led by the attending physician.  Recommendations may be updated based on patient status, additional functional criteria and insurance authorization.  Follow Up Recommendations  Skilled nursing-short term rehab (<3 hours/day) Can patient physically be transported by private vehicle: No   Assistance Recommended at Discharge Frequent or constant Supervision/Assistance  Patient can return home with the following A lot of help with walking and/or transfers;A lot of help with bathing/dressing/bathroom;Help with stairs or ramp for entrance;Assist for transportation;Assistance with cooking/housework   Equipment Recommendations  Rolling walker (2 wheels);Wheelchair (measurements PT);Wheelchair cushion (measurements PT);BSC/3in1    Recommendations for Other Services       Precautions / Restrictions Precautions Precautions:  Fall Restrictions Weight Bearing Restrictions: No LLE Weight Bearing: Weight bearing as tolerated     Mobility  Bed Mobility Overal bed mobility: Needs Assistance Bed Mobility: Supine to Sit     Supine to sit: Mod assist, +2 for safety/equipment     General bed mobility comments: Heavy mod assist and use of bed pad to scoot hips to EOB; Pt more able to help in coming to sit, with hand-over-hand cues to reach L hand across to pull up on bedrail; mod assist to square off hips at EOB    Transfers Overall transfer level: Needs assistance Equipment used: Rolling walker (2 wheels) Transfers: Sit to/from Stand Sit to Stand: Mod assist, +2 safety/equipment   Step pivot transfers: Mod assist, Max assist, +2 physical assistance, +2 safety/equipment       General transfer comment: Heavy multimodal cues to initiate with anterior weight shift to stand from bed; light mod assist to rise and steady as pt moved hands to RW; Able to take a few steps bed to recliner, however drifted into posterior lean, and feet slipping forward by the time pt needed to sit; Max assist to control descent to sit    Ambulation/Gait                   Stairs             Wheelchair Mobility    Modified Rankin (Stroke Patients Only)       Balance     Sitting balance-Leahy Scale: Fair       Standing balance-Leahy Scale: Poor                              Cognition Arousal/Alertness: Awake/alert Behavior During Therapy: WFL for tasks assessed/performed Overall Cognitive  Status: No family/caregiver present to determine baseline cognitive functioning                                 General Comments: Pt is very HoH, but is able to follow commands well when able to hear them.        Exercises      General Comments        Pertinent Vitals/Pain Pain Assessment Pain Assessment: Faces Faces Pain Scale: Hurts a little bit Pain Location: L hip with getting  up Pain Descriptors / Indicators: Grimacing, Guarding Pain Intervention(s): Monitored during session, Repositioned    Home Living                          Prior Function            PT Goals (current goals can now be found in the care plan section) Acute Rehab PT Goals Patient Stated Goal: Hopes to be able to get home PT Goal Formulation: Patient unable to participate in goal setting Time For Goal Achievement: 11/13/22 Potential to Achieve Goals: Good Progress towards PT goals: Progressing toward goals (Slowly)    Frequency    Min 2X/week      PT Plan Discharge plan needs to be updated;Frequency needs to be updated    Co-evaluation              AM-PAC PT "6 Clicks" Mobility   Outcome Measure  Help needed turning from your back to your side while in a flat bed without using bedrails?: A Lot Help needed moving from lying on your back to sitting on the side of a flat bed without using bedrails?: A Lot Help needed moving to and from a bed to a chair (including a wheelchair)?: Total Help needed standing up from a chair using your arms (e.g., wheelchair or bedside chair)?: Total Help needed to walk in hospital room?: Total Help needed climbing 3-5 steps with a railing? : Total 6 Click Score: 8    End of Session Equipment Utilized During Treatment: Gait belt Activity Tolerance: Patient tolerated treatment well Patient left: with call bell/phone within reach;in chair;with chair alarm set Nurse Communication: Mobility status PT Visit Diagnosis: Unsteadiness on feet (R26.81);Other abnormalities of gait and mobility (R26.89);History of falling (Z91.81);Pain Pain - Right/Left: Left Pain - part of body: Hip     Time: [id number] PT Time Calculation (min) (ACUTE ONLY): 14 min  Charges:  $Therapeutic Activity: 8-22 mins                     Roney Marion, PT  Acute Rehabilitation Services Office [phone]    Christy Sutton 11/06/2022, 11:04 AM

## 2022-11-06 NOTE — Progress Notes (Signed)
Occupational Therapy Treatment Patient Details Name: Christy Sutton MRN: [id number] DOB: 23-Sep-1925 Today's Date: 11/06/2022   History of present illness Admitted after ground-level fall resulting in L hip fx, now post surgical fixation, WBAT;  has a past medical history of Atrial fibrillation ([place]), Diabetes mellitus without complication ([place]), Hyperlipidemia, Hypertension, Stroke ([place]), and TIA (transient ischemic attack).   OT comments  Pt making progress with functional goals. D/c updated to SNF due to Insurance has denied AIR. OT will continue to follow acutely to maximize level of function and safety   Recommendations for follow up therapy are one component of a multi-disciplinary discharge planning process, led by the attending physician.  Recommendations may be updated based on patient status, additional functional criteria and insurance authorization.    Follow Up Recommendations  Skilled nursing-short term rehab (<3 hours/day)     Assistance Recommended at Discharge Frequent or constant Supervision/Assistance  Patient can return home with the following  Two people to help with walking and/or transfers;Assistance with cooking/housework;Assist for transportation;Help with stairs or ramp for entrance;Direct supervision/assist for financial management;Direct supervision/assist for medications management;A lot of help with bathing/dressing/bathroom   Equipment Recommendations  Other (comment) (TBD at SNF)    Recommendations for Other Services      Precautions / Restrictions Precautions Precautions: Fall Restrictions Weight Bearing Restrictions: No LLE Weight Bearing: Weight bearing as tolerated       Mobility Bed Mobility               General bed mobility comments: pt in recliner upon arrival    Transfers Overall transfer level: Needs assistance Equipment used: Rolling walker (2 wheels) Transfers: Sit to/from Stand Sit to Stand: Max assist Stand pivot transfers:  Max assist   Step pivot transfers: Max assist, +2 physical assistance, +2 safety/equipment     General transfer comment: multimodal cues for hand placement, posterior leaning with feet sliding forward. Pt rerquired max A to control speed of descent duing stand to - sit transitions     Balance   Sitting-balance support: Bilateral upper extremity supported, Feet supported Sitting balance-Leahy Scale: Fair Sitting balance - Comments: Sitting in recliner   Standing balance support: Bilateral upper extremity supported, During functional activity Standing balance-Leahy Scale: Poor                             ADL either performed or assessed with clinical judgement   ADL Overall ADL's : Needs assistance/impaired     Grooming: Wash/dry hands;Wash/dry face;Brushing hair;Min guard;Sitting Grooming Details (indicate cue type and reason): in recliner with hip scooted forward sititng upright     Lower Body Bathing: Maximal assistance;Sitting/lateral leans Lower Body Bathing Details (indicate cue type and reason): simulated sitting in recliner Upper Body Dressing : Min guard;Sitting       Toilet Transfer: Maximal assistance;Rolling walker (2 wheels);Stand-pivot;BSC/3in1;Cueing for safety;Cueing for sequencing   Toileting- Clothing Manipulation and Hygiene: Maximal assistance Toileting - Clothing Manipulation Details (indicate cue type and reason): clothing mgt     Functional mobility during ADLs: Maximal assistance;Cueing for safety;Cueing for sequencing;Rolling walker (2 wheels)      Extremity/Trunk Assessment Upper Extremity Assessment Upper Extremity Assessment: Generalized weakness   Lower Extremity Assessment Lower Extremity Assessment: Defer to PT evaluation        Vision Ability to See in Adequate Light: 0 Adequate Patient Visual Report: No change from baseline     Perception     Praxis  Cognition Arousal/Alertness: Awake/alert Behavior During  Therapy: WFL for tasks assessed/performed, Restless Overall Cognitive Status: No family/caregiver present to determine baseline cognitive functioning                                 General Comments: Pt is very HOH, but is able to follow commands well when able to hear them.        Exercises      Shoulder Instructions       General Comments      Pertinent Vitals/ Pain       Pain Assessment Pain Assessment: Faces Faces Pain Scale: Hurts little more Pain Location: L LE/hip with movement Pain Descriptors / Indicators: Grimacing, Guarding Pain Intervention(s): Limited activity within patient's tolerance, Monitored during session, Repositioned  Home Living                                          Prior Functioning/Environment              Frequency  Min 2X/week        Progress Toward Goals  OT Goals(current goals can now be found in the care plan section)  Progress towards OT goals: Progressing toward goals     Plan Discharge plan remains appropriate;Frequency remains appropriate    Co-evaluation                 AM-PAC OT "6 Clicks" Daily Activity     Outcome Measure   Help from another person eating meals?: None Help from another person taking care of personal grooming?: A Little Help from another person toileting, which includes using toliet, bedpan, or urinal?: A Lot Help from another person bathing (including washing, rinsing, drying)?: A Lot Help from another person to put on and taking off regular upper body clothing?: A Little Help from another person to put on and taking off regular lower body clothing?: A Lot 6 Click Score: 16    End of Session Equipment Utilized During Treatment: Gait belt;Rolling walker (2 wheels);Other (comment) (BSC)  OT Visit Diagnosis: Unsteadiness on feet (R26.81);Other abnormalities of gait and mobility (R26.89);Muscle weakness (generalized) (M62.81);Other symptoms and signs involving  cognitive function;Pain;History of falling (Z91.81) Pain - Right/Left: Left Pain - part of body: Hip;Leg   Activity Tolerance Patient limited by fatigue;No increased pain   Patient Left in chair;with call bell/phone within reach;with chair alarm set   Nurse Communication          Time: [phone] OT Time Calculation (min): 26 min  Charges: OT General Charges $OT Visit: 1 Visit OT Treatments $Self Care/Home Management : 8-22 mins $Therapeutic Activity: 8-22 mins    Britt Bottom 11/06/2022, 3:08 PM

## 2022-11-06 NOTE — Progress Notes (Signed)
PROGRESS NOTE  Christy Sutton  DOB: [birth year]  PCP: Donnajean Lopes, MD [id number]  DOA: 10/27/2022  LOS: 9 days  Hospital Day: 11  Brief narrative: Christy Sutton is a 89 y.o. female with PMH significant for DM2, HTN, HLD, A-fib, stroke with residual left-sided weakness 2/16, patient presented to the ED after a fall. Imaging showed left intertrochanteric fracture Admitted to [facility] 2/18, underwent intramedullary nailing of the fracture.  Subjective: Patient was seen and examined this morning.  Pleasant elderly Caucasian female.  Sitting up in recliner.  Taking her breakfast.  Not in distress.  No new symptoms.  Heart rate controlled.  Assessment and plan: Closed intertrochanteric fracture of left femur S/p IM nailing 2/18 Impaired mobility Pain control with tramadol as needed Eliquis resumed PT eval obtained.  CIR recommended  Acute blood loss anemia Baseline hemoglobin normal at 12.2.  Noted drop in hemoglobin down to 7.1 on 2/20.  1 unit of PRBC transfusion given with improvement in hemoglobin to 9.2.  Eliquis and Plavix were resumed on 2/21.  Repeat hemoglobin this morning is stable. Recent Labs    10/28/22 0341 10/30/22 0458 10/31/22 0234 11/01/22 0227 11/05/22 0332  HGB 11.7* 7.5* 7.1* 9.2* 9.5*  MCV 93.7 93.5 92.0 87.7 90.9    Hypokalemia Potassium was low at 3.3 yesterday.  Oral replacement given. PTA, patient was on potassium 20 mill equivalent MWF. Recent Labs  Lab 10/31/22 0234 11/05/22 0332  K 3.9 3.3*    Atrial fibrillation, chronic PTA, patient was not on any AV nodal blocking agent.   Heart rate postprocedure continue to run elevated.  Chart reviewed. It seems patient was previously on atenolol 25 mg daily.  I am not sure how long ago was at discontinued.   2/23, I started her on a low-dose of atenolol at 12.5 mg daily.  Heart rate is improved.  Blood pressure is stable. Continue long-term anticoagulation with Eliquis.   History of CVA with  residual left-sided weakness HLD Continue simvastatin.   Continue Eliquis and Plavix  Essential hypertension PTA on Lasix 20 mg twice a week.  Currently on hold.  Blood pressure stable.  Volume status stable.   Type 2 diabetes mellitus A1c 5.7 on 10/28/2022 Not on antidiabetic meds Currently on sliding scale insulin with Accu-Cheks. Recent Labs  Lab 11/01/22 1555 11/01/22 2034 11/02/22 1118 11/02/22 1552 11/02/22 2131  GLUCAP 97 114* 113* 94 95    Goals of care   Code Status: DNR     DVT prophylaxis:  apixaban (ELIQUIS) tablet 2.5 mg Start: 11/01/22 1530 SCDs Start: 10/29/22 1032 SCDs Start: 10/28/22 0114 apixaban (ELIQUIS) tablet 2.5 mg   Antimicrobials: None Fluid: None Consultants: Orthopedics Family Communication: Family not at bedside  Status is: Inpatient Level of care: Telemetry Medical   Dispo: Patient is from: Home              Anticipated d/c is to: CIR denied.  SNF pending.  Medically stable for discharge.  Scheduled Meds:  apixaban  2.5 mg Oral BID   atenolol  12.5 mg Oral Daily   clopidogrel  75 mg Oral Daily   docusate  100 mg Oral BID   feeding supplement  1 Container Oral TID BM   multivitamin with minerals  1 tablet Oral Daily   polyethylene glycol  17 g Oral Daily   senna-docusate  1 tablet Oral QHS    PRN meds: HYDROcodone-acetaminophen, menthol-cetylpyridinium **OR** phenol, metoCLOPramide **OR** metoCLOPramide (REGLAN) injection, morphine injection, ondansetron **OR** ondansetron (ZOFRAN)  IV   Infusions:    Diet:  Diet Order             Diet regular Room service appropriate? No; Fluid consistency: Thin  Diet effective now                   Antimicrobials: Anti-infectives (From admission, onward)    Start     Dose/Rate Route Frequency Ordered Stop   10/29/22 1600  ceFAZolin (ANCEF) IVPB 2g/100 mL premix        2 g 200 mL/hr over 30 Minutes Intravenous Every 8 hours 10/29/22 1031 10/30/22 0046   10/29/22 0700  ceFAZolin  (ANCEF) IVPB 2g/100 mL premix        2 g 200 mL/hr over 30 Minutes Intravenous To ShortStay Surgical 10/28/22 1959 10/29/22 0755   10/29/22 0625  ceFAZolin (ANCEF) 2-4 GM/100ML-% IVPB       Note to Pharmacy: Nyoka Cowden D: cabinet override      10/29/22 0625 10/29/22 0802       Skin assessment:  Pressure Injury 10/28/22 Sacrum (Active)  10/28/22 0200  Location: Sacrum  Location Orientation:   Staging:   Wound Description (Comments):   Present on Admission:       Nutritional status:  Body mass index is 23.36 kg/m.  Nutrition Problem: Moderate Malnutrition Etiology: chronic illness Signs/Symptoms: severe fat depletion, moderate muscle depletion     Objective: Vitals:   11/06/22 0332 11/06/22 0745  BP: (!) 123/58 119/64  Pulse: 98 90  Resp: 17 18  Temp: 97.8 F (36.6 C) 98.3 F (36.8 C)  SpO2: 98% (!) 89%    Intake/Output Summary (Last 24 hours) at 11/06/2022 0821 Last data filed at 11/06/2022 0700 Gross per 24 hour  Intake --  Output 200 ml  Net -200 ml     Filed Weights   10/27/22 2201 10/29/22 0653 11/06/22 0330  Weight: 61.2 kg 61.2 kg 59.8 kg   Weight change:  Body mass index is 23.36 kg/m.   Physical Exam: General exam: Pleasant, elderly Caucasian female. Skin: No rashes, lesions or ulcers. HEENT: Atraumatic, normocephalic, no obvious bleeding Lungs: Clear to auscultation bilaterally CVS: Tachycardic.  No murmur GI/Abd soft, nontender, nondistended, bowel sound present CNS: Alert, awake, oriented to place only Psychiatry: Cheerfully demented.  Not aggressive, not restless at the time of my evaluation Extremities: No pedal edema, no calf tenderness  Data Review: I have personally reviewed the laboratory data and studies available.  F/u labs ordered Unresulted Labs (From admission, onward)    None       Total time spent in review of labs and imaging, patient evaluation, formulation of plan, documentation and communication with  family: 25 minutes  Signed, Terrilee Croak, MD [facility] 11/06/2022

## 2022-11-07 DIAGNOSIS — S72142A Displaced intertrochanteric fracture of left femur, initial encounter for closed fracture: Secondary | ICD-10-CM | POA: Diagnosis not present

## 2022-11-07 MED ORDER — FUROSEMIDE 20 MG PO TABS: 20.0000 mg | ORAL_TABLET | Freq: Every day | ORAL | Status: AC | PRN

## 2022-11-07 MED ORDER — SENNOSIDES-DOCUSATE SODIUM 8.6-50 MG PO TABS: 1.0000 | ORAL_TABLET | Freq: Every day | ORAL | Status: AC

## 2022-11-07 MED ORDER — ATENOLOL 25 MG PO TABS: 12.5000 mg | ORAL_TABLET | Freq: Every day | ORAL | Status: AC

## 2022-11-07 MED ORDER — POLYETHYLENE GLYCOL 3350 17 G PO PACK: 17.0000 g | PACK | Freq: Every day | ORAL | 0 refills | Status: AC

## 2022-11-07 NOTE — Discharge Summary (Signed)
Physician Discharge Summary  Tamar Dantuono [id number] DOB: [birth year] DOA: 10/27/2022  PCP: Donnajean Lopes, MD  Admit date: 10/27/2022 Discharge date: 11/07/2022  Admitted From: home Discharge disposition: SNF   Brief narrative: Christy Sutton is a 89 y.o. female with PMH significant for DM2, HTN, HLD, A-fib, stroke with residual left-sided weakness 2/16, patient presented to the ED after a fall. Imaging showed left intertrochanteric fracture Admitted to [facility] 2/18, underwent intramedullary nailing of the fracture.  Subjective: Patient was seen and examined this morning.  Pleasant elderly Caucasian female.  Sitting up in recliner.  Taking her breakfast.  Not in distress.  No new symptoms.  Heart rate controlled.  Hospital course: Closed intertrochanteric fracture of left femur S/p IM nailing 2/18 Impaired mobility Pain control with tramadol as needed Eliquis resumed PT eval obtained.  CIR recommended  Acute blood loss anemia Baseline hemoglobin normal at 12.2.  Noted drop in hemoglobin down to 7.1 on 2/20.  1 unit of PRBC transfusion given with improvement in hemoglobin to 9.2.  Eliquis and Plavix were resumed on 2/21.  Repeat hemoglobin is stable. Recent Labs    10/28/22 0341 10/30/22 0458 10/31/22 0234 11/01/22 0227 11/05/22 0332  HGB 11.7* 7.5* 7.1* 9.2* 9.5*  MCV 93.7 93.5 92.0 87.7 90.9   Hypokalemia Potassium level improved with replacement. PTA, patient was on potassium 20 mill equivalent MWF. Continue the same post discharge. Recent Labs  Lab 11/05/22 0332  K 3.3*    Atrial fibrillation, chronic PTA, patient was not on any AV nodal blocking agent.   Heart rate postprocedure continue to run elevated.  Chart reviewed. It seems patient was previously on atenolol 25 mg daily.  I am not sure how long ago was at discontinued.   2/23, I started her on a low-dose of atenolol at 12.5 mg daily.  Heart rate is improved.  Blood pressure is stable. Continue long-term  anticoagulation with Eliquis.   History of CVA with residual left-sided weakness HLD Continue simvastatin.   Continue Eliquis and Plavix  Essential hypertension PTA on Lasix 20 mg twice a week.  Currently on hold.  Blood pressure stable.  Volume status stable.   Type 2 diabetes mellitus A1c 5.7 on 10/28/2022 Not on antidiabetic meds Blood sugar level stable.   Goals of care   Code Status: DNR   Wounds:  - Pressure Injury 10/28/22 Sacrum (Active)  Date First Assessed/Time First Assessed: 10/28/22 0200   Location: Sacrum    Assessments 10/28/2022  2:22 AM 11/06/2022  9:13 PM  Dressing Type Foam - Lift dressing to assess site every shift Foam - Lift dressing to assess site every shift  Dressing Clean, Dry, Intact --  Dressing Change Frequency PRN --  Site / Wound Assessment Clean;Pink --  Drainage Amount None --     No associated orders.     Incision (Closed) 10/29/22 Hip Left (Active)  Date First Assessed/Time First Assessed: 10/29/22 0836   Location: Hip  Location Orientation: Left    Assessments 10/29/2022  8:55 AM 11/06/2022  9:13 PM  Dressing Type Hydrocolloid Foam - Lift dressing to assess site every shift  Dressing Clean, Dry, Intact Old drainage (marked)  Site / Wound Assessment Dressing in place / Unable to assess Dressing in place / Unable to assess  Drainage Amount None --     No associated orders.    Discharge Exam:   Vitals:   11/06/22 1248 11/06/22 2227 11/07/22 0546 11/07/22 0715  BP: 127/62 (!) 129/44 (!) 121/45 Marland Kitchen)  123/56  Pulse: 80 65 84 87  Resp: '19 18 18 18  '$ Temp: 98.6 F (37 C) 98.6 F (37 C)  98.4 F (36.9 C)  TempSrc: Oral Oral  Oral  SpO2: 99% 97% 98% 99%  Weight:      Height:        Body mass index is 23.36 kg/m.  General exam: Pleasant, elderly Caucasian female. Skin: No rashes, lesions or ulcers. HEENT: Atraumatic, normocephalic, no obvious bleeding Lungs: Clear to auscultation bilaterally CVS: regular rate and rhythm.  No  murmur GI/Abd soft, nontender, nondistended, bowel sound present CNS: Alert, awake, oriented to place only Psychiatry: Cheerfully demented.  Not aggressive, not restless at the time of my evaluation Extremities: No pedal edema, no calf tenderness  Follow ups:    Follow-up Information     Nicholes Stairs, MD Follow up in 2 week(s).   Specialty: Orthopedic Surgery Contact information: [address] [place] [address] [id number]         Donnajean Lopes, MD .   Specialty: Internal Medicine Contact information: [address] [place]  [address] [phone]                 Discharge Instructions:   Discharge Instructions     Call MD for:  difficulty breathing, headache or visual disturbances   Complete by: As directed    Call MD for:  extreme fatigue   Complete by: As directed    Call MD for:  hives   Complete by: As directed    Call MD for:  persistant dizziness or light-headedness   Complete by: As directed    Call MD for:  persistant nausea and vomiting   Complete by: As directed    Call MD for:  severe uncontrolled pain   Complete by: As directed    Call MD for:  temperature >100.4   Complete by: As directed    Diet general   Complete by: As directed    Discharge instructions   Complete by: As directed    General discharge instructions: Follow with Primary MD Donnajean Lopes, MD in 7 days  Please request your PCP  to go over your hospital tests, procedures, radiology results at the follow up. Please get your medicines reviewed and adjusted.  Your PCP may decide to repeat certain labs or tests as needed. Do not drive, operate heavy machinery, perform activities at heights, swimming or participation in water activities or provide baby sitting services if your were admitted for syncope or siezures until you have seen by Primary MD or a Neurologist and advised to do so again. [place] Controlled Substance Reporting  System database was reviewed. Do not drive, operate heavy machinery, perform activities at heights, swim, participate in water activities or provide baby-sitting services while on medications for pain, sleep and mood until your outpatient physician has reevaluated you and advised to do so again.  You are strongly recommended to comply with the dose, frequency and duration of prescribed medications. Activity: As tolerated with Full fall precautions use walker/cane & assistance as needed Avoid using any recreational substances like cigarette, tobacco, alcohol, or non-prescribed drug. If you experience worsening of your admission symptoms, develop shortness of breath, life threatening emergency, suicidal or homicidal thoughts you must seek medical attention immediately by calling 911 or calling your MD immediately  if symptoms less severe. You must read complete instructions/literature along with all the possible adverse reactions/side effects for all the medicines you take  and that have been prescribed to you. Take any new medicine only after you have completely understood and accepted all the possible adverse reactions/side effects.  Wear Seat belts while driving. You were cared for by a hospitalist during your hospital stay. If you have any questions about your discharge medications or the care you received while you were in the hospital after you are discharged, you can call the unit and ask to speak with the hospitalist or the covering physician. Once you are discharged, your primary care physician will handle any further medical issues. Please note that NO REFILLS for any discharge medications will be authorized once you are discharged, as it is imperative that you return to your primary care physician (or establish a relationship with a primary care physician if you do not have one).   Discharge wound care:   Complete by: As directed    Increase activity slowly   Complete by: As directed         Discharge Medications:   Allergies as of 11/07/2022       Reactions   Penicillins Hives   Has patient had a PCN reaction causing immediate rash, facial/tongue/throat swelling, SOB or lightheadedness with hypotension: Yes Has patient had a PCN reaction causing severe rash involving mucus membranes or skin necrosis: Unknown Has patient had a PCN reaction that required hospitalization: Unknown Has patient had a PCN reaction occurring within the last 10 years: No If all of the above answers are "NO", then may proceed with Cephalosporin use.        Medication List     TAKE these medications    atenolol 25 MG tablet Commonly known as: TENORMIN Take 0.5 tablets (12.5 mg total) by mouth daily.   clopidogrel 75 MG tablet Commonly known as: PLAVIX Take 75 mg by mouth daily.   Eliquis 2.5 MG Tabs tablet Generic drug: apixaban Take 2.5 mg by mouth 2 (two) times daily.   furosemide 20 MG tablet Commonly known as: LASIX Take 1 tablet (20 mg total) by mouth daily as needed for edema or fluid. Monday and Friday What changed:  when to take this reasons to take this   Klor-Con M20 20 MEQ tablet Generic drug: potassium chloride SA Take 20 mEq by mouth every Monday, Wednesday, and Friday.   multivitamin tablet Take 1 tablet by mouth daily.   polyethylene glycol 17 g packet Commonly known as: MIRALAX / GLYCOLAX Take 17 g by mouth daily.   senna-docusate 8.6-50 MG tablet Commonly known as: Senokot-S Take 1 tablet by mouth at bedtime.   simvastatin 40 MG tablet Commonly known as: ZOCOR Take 40 mg by mouth daily.   traMADol 50 MG tablet Commonly known as: Ultram Take 1 tablet (50 mg total) by mouth every 6 (six) hours as needed for up to 20 days.               Discharge Care Instructions  (From admission, onward)           Start     Ordered   11/07/22 0000  Discharge wound care:        11/07/22 0907             The results of significant  diagnostics from this hospitalization (including imaging, microbiology, ancillary and laboratory) are listed below for reference.    Procedures and Diagnostic Studies:   DG FEMUR PORT MIN 2 VIEWS LEFT  Result Date: 10/28/2022 CLINICAL DATA:  Left intratrochanteric femoral fracture EXAM: LEFT FEMUR  PORTABLE 2 VIEWS COMPARISON:  Films from the previous day. FINDINGS: Proximal intratrochanteric femoral fracture is again seen with some impaction and angulation at the fracture site. The more distal femur appears within normal limits. Mild degenerative changes about the knee joint are seen. No soft tissue abnormality is noted. IMPRESSION: Stable left intratrochanteric fracture. Electronically Signed   By: Inez Catalina M.D.   On: 10/28/2022 01:01   CT Cervical Spine Wo Contrast  Result Date: 10/27/2022 CLINICAL DATA:  [place], trauma EXAM: CT CERVICAL SPINE WITHOUT CONTRAST TECHNIQUE: Multidetector CT imaging of the cervical spine was performed without intravenous contrast. Multiplanar CT image reconstructions were also generated. RADIATION DOSE REDUCTION: This exam was performed according to the departmental dose-optimization program which includes automated exposure control, adjustment of the mA and/or kV according to patient size and/or use of iterative reconstruction technique. COMPARISON:  08/04/2021 FINDINGS: Alignment: Alignment is anatomic. Skull base and vertebrae: No acute fracture. No primary bone lesion or focal pathologic process. Soft tissues and spinal canal: No prevertebral fluid or swelling. No visible canal hematoma. Disc levels: Multilevel cervical spondylosis from C3-4 through C6-7. Right-sided neural foraminal narrowing at C4-5 and C5-6, with left-sided neural foraminal encroachment at C6-7. Upper chest: Airway is patent. Stable biapical pleural and parenchymal scarring. Left apical pulmonary nodule measures 7 x 8 mm reference image 79/5, unchanged since 03/14/2021. Other: Reconstructed images  demonstrate no additional findings. IMPRESSION: 1. No acute cervical spine fracture. 2. Stable multilevel cervical spondylosis. 3. Stable left apical pulmonary nodule, likely benign given long-term stability. Electronically Signed   By: Randa Ngo M.D.   On: 10/27/2022 23:59   CT Head Wo Contrast  Result Date: 10/27/2022 CLINICAL DATA:  [place], trauma EXAM: CT HEAD WITHOUT CONTRAST TECHNIQUE: Contiguous axial images were obtained from the base of the skull through the vertex without intravenous contrast. RADIATION DOSE REDUCTION: This exam was performed according to the departmental dose-optimization program which includes automated exposure control, adjustment of the mA and/or kV according to patient size and/or use of iterative reconstruction technique. COMPARISON:  08/04/2021 FINDINGS: Brain: No acute infarct or hemorrhage. Lateral ventricles and midline structures are stable. No acute extra-axial fluid collections. No mass effect. Stable diffuse cortical atrophy. Vascular: No hyperdense vessel or unexpected calcification. Skull: Normal. Negative for fracture or focal lesion. Sinuses/Orbits: Chronic opacification of the sphenoid sinus. Remaining paranasal sinuses are clear. Other: None. IMPRESSION: 1. No acute intracranial process. Electronically Signed   By: Randa Ngo M.D.   On: 10/27/2022 23:55   DG Pelvis Portable  Result Date: 10/27/2022 CLINICAL DATA:  [place] EXAM: PORTABLE PELVIS 1-2 VIEWS COMPARISON:  None Available. FINDINGS: Single frontal view of the pelvis excludes portions of the right hip by collimation. There is a mildly displaced intertrochanteric left hip fracture. No significant impaction or angulation. No dislocation. Remainder of the bony pelvis is unremarkable. IMPRESSION: 1. Minimally displaced intertrochanteric left hip fracture. Electronically Signed   By: Randa Ngo M.D.   On: 10/27/2022 22:11   DG Chest Portable 1 View  Result Date: 10/27/2022 CLINICAL DATA:  [place],  anticoagulated EXAM: PORTABLE CHEST 1 VIEW COMPARISON:  03/14/2021 FINDINGS: Single frontal view of the chest demonstrates an unremarkable cardiac silhouette. No airspace disease, effusion, or pneumothorax. No acute bony abnormalities. IMPRESSION: 1. No acute intrathoracic process. Electronically Signed   By: Randa Ngo M.D.   On: 10/27/2022 22:11   DG Wrist Complete Left  Result Date: 10/27/2022 CLINICAL DATA:  Fall, level 2 trauma. EXAM: LEFT WRIST - COMPLETE 3+ VIEW  COMPARISON:  None Available. FINDINGS: There is no evidence of fracture or dislocation. Joint space narrowing and subchondral sclerosis at the first carpometacarpal joint. Osteopenia. Soft tissues are unremarkable. IMPRESSION: 1. No acute fracture or dislocation. 2. Severe degenerative changes at the first carpometacarpal joint. Electronically Signed   By: Keane Police D.O.   On: 10/27/2022 22:10     Labs:   Basic Metabolic Panel: Recent Labs  Lab 11/05/22 0332  NA 140  K 3.3*  CL 106  CO2 23  GLUCOSE 97  BUN 10  CREATININE 0.65  CALCIUM 8.0*   GFR Estimated Creatinine Clearance: 34.8 mL/min (by C-G formula based on SCr of 0.65 mg/dL). Liver Function Tests: No results for input(s): "AST", "ALT", "ALKPHOS", "BILITOT", "PROT", "ALBUMIN" in the last 168 hours. No results for input(s): "LIPASE", "AMYLASE" in the last 168 hours. No results for input(s): "AMMONIA" in the last 168 hours. Coagulation profile No results for input(s): "INR", "PROTIME" in the last 168 hours.  CBC: Recent Labs  Lab 11/01/22 0227 11/05/22 0332  WBC 9.1 6.2  NEUTROABS  --  5.0  HGB 9.2* 9.5*  HCT 26.5* 28.0*  MCV 87.7 90.9  PLT 156 211   Cardiac Enzymes: No results for input(s): "CKTOTAL", "CKMB", "CKMBINDEX", "TROPONINI" in the last 168 hours. BNP: Invalid input(s): "POCBNP" CBG: Recent Labs  Lab 11/01/22 1555 11/01/22 2034 11/02/22 1118 11/02/22 1552 11/02/22 2131  GLUCAP 97 114* 113* 94 95   D-Dimer No results for  input(s): "DDIMER" in the last 72 hours. Hgb A1c No results for input(s): "HGBA1C" in the last 72 hours. Lipid Profile No results for input(s): "CHOL", "HDL", "LDLCALC", "TRIG", "CHOLHDL", "LDLDIRECT" in the last 72 hours. Thyroid function studies No results for input(s): "TSH", "T4TOTAL", "T3FREE", "THYROIDAB" in the last 72 hours.  Invalid input(s): "FREET3" Anemia work up No results for input(s): "VITAMINB12", "FOLATE", "FERRITIN", "TIBC", "IRON", "RETICCTPCT" in the last 72 hours. Microbiology Recent Results (from the past 240 hour(s))  Surgical pcr screen     Status: Abnormal   Collection Time: 10/28/22  8:17 PM   Specimen: Nasal Mucosa; Nasal Swab  Result Value Ref Range Status   MRSA, PCR NEGATIVE NEGATIVE Final   Staphylococcus aureus POSITIVE (A) NEGATIVE Final    Comment: (NOTE) The Xpert SA Assay (FDA approved for NASAL specimens in patients 77 years of age and older), is one component of a comprehensive surveillance program. It is not intended to diagnose infection nor to guide or monitor treatment. Performed at [facility], [place] [address] [place], [place] [address]     Time coordinating discharge: 35 minutes  Signed: Vaeda Westall  [facility] 11/07/2022, 9:07 AM

## 2022-11-07 NOTE — TOC Transition Note (Signed)
Transition of Care [facility]) - CM/SW Discharge Note   Patient Details  Name: Christy Sutton MRN: [id number] Date of Birth: 1925/12/18  [facility] CM/SW Contact:  Joanne Chars, LCSW Phone Number: 11/07/2022, 11:42 AM   Clinical Narrative:   Pt discharging to [facility] room 205.  RN call report to [phone].  1000: CSW confirmed with [facility] that they are ready to receive pt today.   Final next level of care: Skilled Nursing Facility Barriers to Discharge: Barriers Resolved   Patient Goals and CMS Choice CMS Medicare.gov Compare Post Acute Care list provided to:: Other (Comment Required) (son and daughter) Choice offered to / list presented to : Adult Children  Discharge Placement                Patient chooses bed at: [place] [place] Patient to be transferred to facility by: [facility] Name of family member notified: son Gwyndolyn Saxon Patient and family notified of of transfer: 11/07/22  Discharge Plan and Services Additional resources added to the After Visit Summary for                                       Social Determinants of Health (SDOH) Interventions SDOH Screenings   Tobacco Use: Medium Risk (11/06/2022)     Readmission Risk Interventions     No data to display

## 2022-11-07 NOTE — Plan of Care (Signed)
  Problem: Coping: Goal: Ability to adjust to condition or change in health will improve Outcome: Progressing   Problem: Health Behavior/Discharge Planning: Goal: Ability to identify and utilize available resources and services will improve Outcome: Progressing   Problem: Education: Goal: Knowledge of General Education information will improve Description: Including pain rating scale, medication(s)/side effects and non-pharmacologic comfort measures Outcome: Not Progressing

## 2022-11-07 NOTE — Progress Notes (Signed)
Report called and given to Caryl Pina, LPN at [facility] facility. PTAR scheduled for 60-90 minutes from now.

## 2022-11-07 NOTE — Discharge Planning (Signed)
IV taken out, report called to facility, patient transported out via [place]. Family notified of transport to [facility] facility.

## 2022-11-07 NOTE — TOC Progression Note (Signed)
Transition of Care [facility]) - Progression Note    Patient Details  Name: Christy Sutton MRN: [id number] Date of Birth: December 16, 1925  Transition of Care [facility]) CM/SW Contact  Joanne Chars, LCSW Phone Number: 11/07/2022, 8:40 AM  Clinical Narrative:  SNF auth [id number], [id number], 3 days: 2/26-2/28.  MD informed.       Expected Discharge Plan: Skilled Nursing Facility Barriers to Discharge: SNF Pending bed offer  Expected Discharge Plan and Services                                               Social Determinants of Health (SDOH) Interventions SDOH Screenings   Tobacco Use: Medium Risk (11/06/2022)    Readmission Risk Interventions     No data to display

## 2022-11-07 NOTE — Care Management Important Message (Signed)
Important Message  Patient Details  Name: Christy Sutton MRN: [id number] Date of Birth: 1926/08/12   Medicare Important Message Given:  Yes     Hannah Beat 11/07/2022, 12:04 PM

## 2023-02-10 DEATH — deceased
